# Patient Record
Sex: Female | Born: 1980 | Race: Black or African American | Hispanic: No | Marital: Married | Smoking: Former smoker
Health system: Southern US, Community
[De-identification: ages and names within clinical notes are randomized; demographics above are authoritative.]

## PROBLEM LIST (undated history)

## (undated) DIAGNOSIS — F17200 Nicotine dependence, unspecified, uncomplicated: Secondary | ICD-10-CM

## (undated) DIAGNOSIS — Z9889 Other specified postprocedural states: Secondary | ICD-10-CM

## (undated) DIAGNOSIS — T7840XA Allergy, unspecified, initial encounter: Secondary | ICD-10-CM

## (undated) DIAGNOSIS — R112 Nausea with vomiting, unspecified: Secondary | ICD-10-CM

## (undated) DIAGNOSIS — L732 Hidradenitis suppurativa: Secondary | ICD-10-CM

## (undated) DIAGNOSIS — E041 Nontoxic single thyroid nodule: Secondary | ICD-10-CM

## (undated) DIAGNOSIS — K219 Gastro-esophageal reflux disease without esophagitis: Secondary | ICD-10-CM

## (undated) DIAGNOSIS — G4700 Insomnia, unspecified: Secondary | ICD-10-CM

## (undated) DIAGNOSIS — E049 Nontoxic goiter, unspecified: Secondary | ICD-10-CM

## (undated) DIAGNOSIS — R011 Cardiac murmur, unspecified: Secondary | ICD-10-CM

## (undated) DIAGNOSIS — R51 Headache: Secondary | ICD-10-CM

## (undated) DIAGNOSIS — E669 Obesity, unspecified: Secondary | ICD-10-CM

## (undated) DIAGNOSIS — R7301 Impaired fasting glucose: Secondary | ICD-10-CM

## (undated) HISTORY — PX: TUBAL LIGATION: SHX77

## (undated) HISTORY — DX: Impaired fasting glucose: R73.01

## (undated) HISTORY — DX: Cardiac murmur, unspecified: R01.1

## (undated) HISTORY — DX: Nontoxic single thyroid nodule: E04.1

## (undated) HISTORY — DX: Nontoxic goiter, unspecified: E04.9

## (undated) HISTORY — PX: INCISION AND DRAINAGE: SHX5863

## (undated) HISTORY — DX: Hidradenitis suppurativa: L73.2

## (undated) HISTORY — DX: Nicotine dependence, unspecified, uncomplicated: F17.200

## (undated) HISTORY — DX: Obesity, unspecified: E66.9

## (undated) HISTORY — DX: Insomnia, unspecified: G47.00

## (undated) HISTORY — DX: Allergy, unspecified, initial encounter: T78.40XA

---
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
## 2001-02-09 ENCOUNTER — Inpatient Hospital Stay (HOSPITAL_COMMUNITY): Admission: AD | Admit: 2001-02-09 | Discharge: 2001-02-11 | Payer: Self-pay | Admitting: Obstetrics & Gynecology

## 2001-08-28 ENCOUNTER — Other Ambulatory Visit: Admission: RE | Admit: 2001-08-28 | Discharge: 2001-08-28 | Payer: Self-pay | Admitting: Obstetrics and Gynecology

## 2001-12-02 ENCOUNTER — Emergency Department (HOSPITAL_COMMUNITY): Admission: EM | Admit: 2001-12-02 | Discharge: 2001-12-02 | Payer: Self-pay | Admitting: Emergency Medicine

## 2002-03-20 ENCOUNTER — Emergency Department (HOSPITAL_COMMUNITY): Admission: EM | Admit: 2002-03-20 | Discharge: 2002-03-20 | Payer: Self-pay | Admitting: Emergency Medicine

## 2002-03-20 ENCOUNTER — Encounter: Payer: Self-pay | Admitting: Emergency Medicine

## 2004-09-29 ENCOUNTER — Other Ambulatory Visit: Admission: RE | Admit: 2004-09-29 | Discharge: 2004-09-29 | Payer: Self-pay | Admitting: Family Medicine

## 2004-10-01 ENCOUNTER — Encounter: Admission: RE | Admit: 2004-10-01 | Discharge: 2004-10-01 | Payer: Self-pay | Admitting: Family Medicine

## 2005-08-04 ENCOUNTER — Other Ambulatory Visit: Admission: RE | Admit: 2005-08-04 | Discharge: 2005-08-04 | Payer: Self-pay | Admitting: Family Medicine

## 2006-04-11 HISTORY — PX: PILONIDAL CYST / SINUS EXCISION: SUR543

## 2006-06-21 ENCOUNTER — Emergency Department (HOSPITAL_COMMUNITY): Admission: EM | Admit: 2006-06-21 | Discharge: 2006-06-21 | Payer: Self-pay | Admitting: Emergency Medicine

## 2006-06-25 ENCOUNTER — Emergency Department (HOSPITAL_COMMUNITY): Admission: EM | Admit: 2006-06-25 | Discharge: 2006-06-25 | Payer: Self-pay | Admitting: Emergency Medicine

## 2006-06-27 ENCOUNTER — Ambulatory Visit: Payer: Self-pay | Admitting: Family Medicine

## 2006-06-30 ENCOUNTER — Ambulatory Visit (HOSPITAL_COMMUNITY): Admission: RE | Admit: 2006-06-30 | Discharge: 2006-06-30 | Payer: Self-pay | Admitting: Family Medicine

## 2006-10-03 ENCOUNTER — Ambulatory Visit: Payer: Self-pay | Admitting: Family Medicine

## 2007-04-12 HISTORY — PX: CHOLECYSTECTOMY: SHX55

## 2007-06-07 ENCOUNTER — Ambulatory Visit: Payer: Self-pay | Admitting: Family Medicine

## 2007-09-04 ENCOUNTER — Ambulatory Visit: Payer: Self-pay | Admitting: Family Medicine

## 2007-11-29 ENCOUNTER — Ambulatory Visit: Payer: Self-pay | Admitting: Family Medicine

## 2008-01-01 ENCOUNTER — Ambulatory Visit: Payer: Self-pay | Admitting: Family Medicine

## 2008-03-10 ENCOUNTER — Ambulatory Visit: Payer: Self-pay | Admitting: Family Medicine

## 2009-04-14 ENCOUNTER — Ambulatory Visit: Payer: Self-pay | Admitting: Family Medicine

## 2009-07-24 ENCOUNTER — Ambulatory Visit: Payer: Self-pay | Admitting: Family Medicine

## 2010-05-02 ENCOUNTER — Encounter: Payer: Self-pay | Admitting: Family Medicine

## 2010-06-14 ENCOUNTER — Encounter (INDEPENDENT_AMBULATORY_CARE_PROVIDER_SITE_OTHER): Payer: BC Managed Care – PPO | Admitting: Family Medicine

## 2010-06-14 DIAGNOSIS — L02219 Cutaneous abscess of trunk, unspecified: Secondary | ICD-10-CM

## 2010-06-14 DIAGNOSIS — R5381 Other malaise: Secondary | ICD-10-CM

## 2010-06-14 DIAGNOSIS — E042 Nontoxic multinodular goiter: Secondary | ICD-10-CM

## 2010-06-14 DIAGNOSIS — Z0000 Encounter for general adult medical examination without abnormal findings: Secondary | ICD-10-CM

## 2010-06-16 ENCOUNTER — Ambulatory Visit (INDEPENDENT_AMBULATORY_CARE_PROVIDER_SITE_OTHER): Payer: BC Managed Care – PPO | Admitting: Family Medicine

## 2010-06-16 DIAGNOSIS — J029 Acute pharyngitis, unspecified: Secondary | ICD-10-CM

## 2010-08-27 NOTE — Discharge Summary (Signed)
[facility]  Patient:    Jacqueline Winters, Jacqueline Winters Visit Number: [id number] MRN: [id number]          Service Type: OBS Location: 910A [id number] [id number] Attending Physician:  Mickle Mallory Dictated by:   Gerrit Friends. Aldona Bar, M.D. Admit Date:  02/09/2001 Discharge Date: 02/11/2001                             Discharge Summary  DISCHARGE DIAGNOSES:          1. Term pregnancy, delivered 5 pound                                  12 ounce female infant with Apgars 8 and 9.                               2. Blood type B positive.  PROCEDURES:                   1. Normal spontaneous delivery.                               2. Repair of tear.  SUMMARY:                      This gravida 2, para 59, 30 year old was admitted with ruptured membranes. She had a positive antenatal group B strep culture. Otherwise, her pregnancy was uncomplicated. She had good progression of her labor and subsequently had a normal spontaneous delivery of a viable female infant weighing 5 pounds 12 ounces with Apgars of 8 and 9 over a left labial tear which was repaired subsequently without difficulty. Her postpartum course was benign. Discharge hemoglobin was 10.3, white count 12,500, and platelet count 281,000. On the morning of November 3, she was ambulating well, tolerating a regular diet well, having normal bowel and bladder function, and was afebrile. She was bottle feeding. She was deemed ready for discharge and accordingly, was given a discharge instruction sheet and understood all instructions well.  DISCHARGE MEDICATIONS:        1. Vitamins one a day until gone.                               2. Ferrous sulfate 300 mg daily.                               3. Motrin 600 mg every six hours as needed for                                  pain.                               4. Tylox one to two every four to six hours                                  as needed for severe pain.  DISCHARGE  FOLLOWUP:  The patient will return to the office in four weeks for followup or as needed. At the time of discharge she was improved and, as mentioned, understood all instructions well.  CONDITION ON DISCHARGE:       Improved. Dictated by:   Gerrit Friends. Aldona Bar, M.D. Attending Physician:  Mickle Mallory DD:  02/11/01 TD:  02/12/01 Job: [id number]

## 2011-03-25 ENCOUNTER — Encounter: Payer: Self-pay | Admitting: Medical

## 2011-03-25 ENCOUNTER — Ambulatory Visit (INDEPENDENT_AMBULATORY_CARE_PROVIDER_SITE_OTHER): Payer: BC Managed Care – PPO | Admitting: Medical

## 2011-03-25 VITALS — BP 110/80 | HR 68 | Temp 98.5°F | Resp 16 | Wt 210.0 lb

## 2011-03-25 DIAGNOSIS — R05 Cough: Secondary | ICD-10-CM

## 2011-03-25 DIAGNOSIS — R5383 Other fatigue: Secondary | ICD-10-CM

## 2011-03-25 DIAGNOSIS — R350 Frequency of micturition: Secondary | ICD-10-CM

## 2011-03-25 DIAGNOSIS — M549 Dorsalgia, unspecified: Secondary | ICD-10-CM

## 2011-03-25 DIAGNOSIS — R5381 Other malaise: Secondary | ICD-10-CM

## 2011-03-25 DIAGNOSIS — R059 Cough, unspecified: Secondary | ICD-10-CM

## 2011-03-25 LAB — POCT URINALYSIS DIPSTICK
Glucose, UA: NEGATIVE
Ketones, UA: NEGATIVE
Nitrite, UA: NEGATIVE
Spec Grav, UA: 1.015
Urobilinogen, UA: NEGATIVE

## 2011-03-25 MED ORDER — SULFAMETHOXAZOLE-TRIMETHOPRIM 800-160 MG PO TABS: 1.0000 | ORAL_TABLET | Freq: Two times a day (BID) | ORAL | Status: AC

## 2011-03-25 NOTE — Progress Notes (Signed)
Subjective:   HPI  Jacqueline Winters is a 30 y.o. female who presents with 1 week hx/o fatigue, nasal drainage, mucous, cough, nausea.  Been using Mucinex DM.  Been coughing on and off since thanksgiving.  She also notes pressure in low back, urinary frequency, increased thirst.  Denies burning with urination or belly pain.  She has had sick contacts recently.  Son has been sick.  She is worried about diabetes since her mom and grandmother are both diabetic. No other aggravating or relieving factors.    No other c/o.  The following portions of the patient's history were reviewed and updated as appropriate: allergies, current medications, past family history, past medical history, past social history, past surgical history and problem list.  No past medical history on file.  Review of Systems Constitutional: -fever, -chills, -sweats, -unexpected -weight change,+fatigue ENT: -runny nose, -ear pain, -sore throat Cardiology:  -chest pain, -palpitations, -edema Respiratory: +cough, +shortness of breath, -wheezing,+thirsty Gastroenterology: -abdominal pain, +nausea, -vomiting, -diarrhea, -constipation Hematology: -bleeding or bruising problems Musculoskeletal: -arthralgias, -myalgias, -joint swelling, +back pain Ophthalmology: -vision changes Urology: -dysuria, -difficulty urinating, -hematuria, +urinary frequency, -urgency Neurology: +headache, -weakness, -tingling, -numbness      Objective:   Physical Exam  Filed Vitals:   03/25/11 0956  BP: 110/80  Pulse: 68  Temp: 98.5 F (36.9 C)  Resp: 16    General appearance: alert, no distress, WD/WN HEENT: normocephalic, sclerae anicteric, TMs pearly, nares patent, no discharge or erythema, pharynx with mild erythema Oral cavity: MMM, no lesions Neck: supple, no lymphadenopathy, no thyromegaly, no masses Heart: RRR, normal S1, S2, no murmurs Lungs: CTA bilaterally, no wheezes, rhonchi, or rales Abdomen: +bs, soft, non tender, non  distended, no masses, no hepatomegaly, no splenomegaly Pulses: 2+ symmetric, upper and lower extremities, normal cap refill   Assessment and Plan :    Encounter Diagnoses  Name Primary?  . Fatigue Yes  . Urinary frequency   . Back pain   . Cough    Will treat empirically for UTI and possibly URI.  Script for [employer]. Rest, hydrate well, Ibuprofen prn fever/aches, labs today and will call with results and plan. Follow-up prn.

## 2011-03-26 DIAGNOSIS — R05 Cough: Secondary | ICD-10-CM | POA: Insufficient documentation

## 2011-03-26 DIAGNOSIS — R350 Frequency of micturition: Secondary | ICD-10-CM | POA: Insufficient documentation

## 2011-03-26 DIAGNOSIS — R059 Cough, unspecified: Secondary | ICD-10-CM | POA: Insufficient documentation

## 2011-03-26 DIAGNOSIS — M549 Dorsalgia, unspecified: Secondary | ICD-10-CM | POA: Insufficient documentation

## 2011-03-26 DIAGNOSIS — R5383 Other fatigue: Secondary | ICD-10-CM | POA: Insufficient documentation

## 2011-03-26 LAB — COMPREHENSIVE METABOLIC PANEL
ALT: 12 U/L (ref 0–35)
AST: 15 U/L (ref 0–37)
Alkaline Phosphatase: 47 U/L (ref 39–117)
CO2: 23 mEq/L (ref 19–32)
Chloride: 108 mEq/L (ref 96–112)
Creat: 0.75 mg/dL (ref 0.50–1.10)
Sodium: 139 mEq/L (ref 135–145)
Total Bilirubin: 0.3 mg/dL (ref 0.3–1.2)
Total Protein: 6.7 g/dL (ref 6.0–8.3)

## 2011-03-26 LAB — CBC WITH DIFFERENTIAL/PLATELET
Basophils Relative: 0 % (ref 0–1)
MCH: 29.3 pg (ref 26.0–34.0)
MCHC: 32.2 g/dL (ref 30.0–36.0)
WBC: 9.7 10*3/uL (ref 4.0–10.5)

## 2011-03-28 LAB — URINE CULTURE

## 2011-06-14 LAB — OB RESULTS CONSOLE ABO/RH

## 2011-06-14 LAB — OB RESULTS CONSOLE HIV ANTIBODY (ROUTINE TESTING): HIV: NONREACTIVE

## 2011-06-14 LAB — OB RESULTS CONSOLE HEPATITIS B SURFACE ANTIGEN: Hepatitis B Surface Ag: NEGATIVE

## 2011-07-20 ENCOUNTER — Ambulatory Visit (INDEPENDENT_AMBULATORY_CARE_PROVIDER_SITE_OTHER): Payer: BC Managed Care – PPO | Admitting: Medical

## 2011-07-20 ENCOUNTER — Encounter: Payer: Self-pay | Admitting: Medical

## 2011-07-20 VITALS — BP 110/80 | HR 62 | Temp 98.5°F | Resp 16 | Wt 220.0 lb

## 2011-07-20 DIAGNOSIS — Z331 Pregnant state, incidental: Secondary | ICD-10-CM

## 2011-07-20 DIAGNOSIS — J069 Acute upper respiratory infection, unspecified: Secondary | ICD-10-CM

## 2011-07-20 DIAGNOSIS — J029 Acute pharyngitis, unspecified: Secondary | ICD-10-CM

## 2011-07-20 LAB — POCT RAPID STREP A (OFFICE): Rapid Strep A Screen: NEGATIVE

## 2011-07-20 NOTE — Progress Notes (Signed)
Subjective:  Jacqueline Winters is a 31 y.o. female who presents for nasal congestion, drainage, sore throat, neck tender, lymph nodes tender, sinus pressure worse x 3days, but has had ongoing head congestion for 2 months.  Drainage is still clear, is fatigue feeling too.  Felt warm yesterday but no fever.  Denies cough, NV, but +some loose stools this past weekend.  She is currently pregnant, followed by OB/Gyn currently - [redacted] weeks pregnant currently.  Was on allegra prior to pregnancy, but not on any allergy medication currently.  She notes hx/o sinus infection.  Patient is a smoker - off an on, uses occasional cigarette. Daughter has had stomach virus but no URI symptoms.  Using nothing for symptoms.   No other aggravating or relieving factors.  No other c/o.  ROS as noted in HPI  Objective:   Filed Vitals:   07/20/11 0846  BP: 110/80  Pulse: 62  Temp: 98.5 F (36.9 C)  Resp: 16    General appearance: Alert, WD/WN, no distress                             Skin: warm, no rash                           Head: no sinus tenderness                            Eyes: conjunctiva normal, corneas clear, PERRLA                            Ears: pearly TMs, external ear canals normal                          Nose: septum midline, turbinates swollen, with erythema and clear discharge             Mouth/throat: MMM, tongue normal, mild pharyngeal erythema                           Neck: supple, no adenopathy, no thyromegaly, nontender                          Heart: RRR, normal S1, S2, no murmurs                         Lungs: CTA bilaterally, no wheezes, rales, or rhonchi      Assessment and Plan:   Encounter Diagnoses  Name Primary?  . URI (upper respiratory infection) Yes  . Pharyngitis    Strep negative.  Advised supportive care, nasal saline, rest, hydrate well, begin Zyrtec for next 1-4 weeks for allergies, and if worse by end of week such as colored nasal drainage, fever, teeth pain, then  call or return.

## 2011-07-20 NOTE — Patient Instructions (Signed)
Begin Zyrtec 10mg  at bedtime daily for allergies, nasal saline, Tylenol for pain, and if worse with fever and colored mucous by end of the week,call back.

## 2011-11-30 LAB — OB RESULTS CONSOLE GBS: GBS: NEGATIVE

## 2011-12-20 ENCOUNTER — Other Ambulatory Visit: Payer: Self-pay | Admitting: Obstetrics and Gynecology

## 2011-12-20 ENCOUNTER — Encounter (HOSPITAL_COMMUNITY): Payer: Self-pay

## 2011-12-21 ENCOUNTER — Encounter (HOSPITAL_COMMUNITY): Payer: Self-pay | Admitting: Anesthesiology

## 2011-12-21 ENCOUNTER — Inpatient Hospital Stay (HOSPITAL_COMMUNITY)
Admission: AD | Admit: 2011-12-21 | Discharge: 2011-12-24 | DRG: 766 | Disposition: A | Discharge status: Home / Self Care | Payer: Commercial Managed Care - PPO | Source: Ambulatory Visit | Attending: Obstetrics and Gynecology | Admitting: Obstetrics and Gynecology

## 2011-12-21 ENCOUNTER — Encounter (HOSPITAL_COMMUNITY): Payer: Self-pay | Admitting: *Deleted

## 2011-12-21 ENCOUNTER — Encounter (HOSPITAL_COMMUNITY): Payer: Self-pay

## 2011-12-21 ENCOUNTER — Encounter (HOSPITAL_COMMUNITY)
Admission: AD | Disposition: A | Discharge status: Home / Self Care | Payer: Self-pay | Source: Ambulatory Visit | Attending: Obstetrics and Gynecology

## 2011-12-21 ENCOUNTER — Encounter (HOSPITAL_COMMUNITY)
Admission: RE | Admit: 2011-12-21 | Discharge: 2011-12-21 | Disposition: A | Discharge status: Home / Self Care | Payer: Commercial Managed Care - PPO | Source: Ambulatory Visit | Attending: Obstetrics and Gynecology | Admitting: Obstetrics and Gynecology

## 2011-12-21 ENCOUNTER — Inpatient Hospital Stay (HOSPITAL_COMMUNITY): Payer: Commercial Managed Care - PPO | Admitting: Anesthesiology

## 2011-12-21 DIAGNOSIS — M549 Dorsalgia, unspecified: Secondary | ICD-10-CM

## 2011-12-21 DIAGNOSIS — O328XX0 Maternal care for other malpresentation of fetus, not applicable or unspecified: Principal | ICD-10-CM | POA: Diagnosis present

## 2011-12-21 DIAGNOSIS — R350 Frequency of micturition: Secondary | ICD-10-CM

## 2011-12-21 DIAGNOSIS — R05 Cough: Secondary | ICD-10-CM

## 2011-12-21 DIAGNOSIS — R5383 Other fatigue: Secondary | ICD-10-CM

## 2011-12-21 DIAGNOSIS — Z01812 Encounter for preprocedural laboratory examination: Secondary | ICD-10-CM

## 2011-12-21 DIAGNOSIS — Z3480 Encounter for supervision of other normal pregnancy, unspecified trimester: Secondary | ICD-10-CM

## 2011-12-21 DIAGNOSIS — R059 Cough, unspecified: Secondary | ICD-10-CM

## 2011-12-21 DIAGNOSIS — Z302 Encounter for sterilization: Secondary | ICD-10-CM

## 2011-12-21 HISTORY — DX: Headache: R51

## 2011-12-21 HISTORY — DX: Gastro-esophageal reflux disease without esophagitis: K21.9

## 2011-12-21 HISTORY — DX: Other specified postprocedural states: Z98.890

## 2011-12-21 HISTORY — DX: Nausea with vomiting, unspecified: R11.2

## 2011-12-21 LAB — CBC
HCT: 35 % — ABNORMAL LOW (ref 36.0–46.0)
HCT: 34.7 % — ABNORMAL LOW (ref 36.0–46.0)
Hemoglobin: 11.7 g/dL — ABNORMAL LOW (ref 12.0–15.0)
Hemoglobin: 11.6 g/dL — ABNORMAL LOW (ref 12.0–15.0)
MCH: 29 pg (ref 26.0–34.0)
MCHC: 33.4 g/dL (ref 30.0–36.0)
MCV: 86.5 fL (ref 78.0–100.0)
Platelets: 253 10*3/uL (ref 150–400)
RBC: 4.03 MIL/uL (ref 3.87–5.11)
RBC: 4.01 MIL/uL (ref 3.87–5.11)
RDW: 13.3 % (ref 11.5–15.5)
RDW: 13.3 % (ref 11.5–15.5)
WBC: 11.2 10*3/uL — ABNORMAL HIGH (ref 4.0–10.5)
WBC: 14.5 10*3/uL — ABNORMAL HIGH (ref 4.0–10.5)

## 2011-12-21 LAB — TYPE AND SCREEN
ABO/RH(D): B POS
Antibody Screen: NEGATIVE

## 2011-12-21 LAB — SURGICAL PCR SCREEN
MRSA, PCR: NEGATIVE
Staphylococcus aureus: NEGATIVE

## 2011-12-21 SURGERY — Surgical Case
Anesthesia: Spinal | Site: Abdomen | Wound class: Clean Contaminated

## 2011-12-21 MED ORDER — MEPERIDINE HCL 25 MG/ML IJ SOLN
INTRAMUSCULAR | Status: AC
  Filled 2011-12-21: qty 1

## 2011-12-21 MED ORDER — MORPHINE SULFATE 0.5 MG/ML IJ SOLN
INTRAMUSCULAR | Status: AC
  Filled 2011-12-21: qty 10

## 2011-12-21 MED ORDER — PHENYLEPHRINE HCL 10 MG/ML IJ SOLN
INTRAMUSCULAR | Status: DC | PRN
  Administered 2011-12-21: 80 ug via INTRAVENOUS
  Administered 2011-12-21: 60 ug via INTRAVENOUS
  Administered 2011-12-21 (×4): 80 ug via INTRAVENOUS

## 2011-12-21 MED ORDER — PHENYLEPHRINE 40 MCG/ML (10ML) SYRINGE FOR IV PUSH (FOR BLOOD PRESSURE SUPPORT)
PREFILLED_SYRINGE | INTRAVENOUS | Status: AC
  Filled 2011-12-21: qty 5

## 2011-12-21 MED ORDER — SCOPOLAMINE 1 MG/3DAYS TD PT72
MEDICATED_PATCH | TRANSDERMAL | Status: AC
  Filled 2011-12-21: qty 1

## 2011-12-21 MED ORDER — TERBUTALINE SULFATE 1 MG/ML IJ SOLN
0.2500 mg | Freq: Once | INTRAMUSCULAR | Status: DC
  Filled 2011-12-21: qty 1

## 2011-12-21 MED ORDER — CITRIC ACID-SODIUM CITRATE 334-500 MG/5ML PO SOLN
30.0000 mL | Freq: Once | ORAL | Status: AC
  Administered 2011-12-21: 30 mL via ORAL
  Filled 2011-12-21: qty 15

## 2011-12-21 MED ORDER — SCOPOLAMINE 1 MG/3DAYS TD PT72
1.0000 | MEDICATED_PATCH | Freq: Once | TRANSDERMAL | Status: DC
  Administered 2011-12-22: 1.5 mg via TRANSDERMAL

## 2011-12-21 MED ORDER — CEFAZOLIN SODIUM-DEXTROSE 2-3 GM-% IV SOLR
INTRAVENOUS | Status: DC | PRN
  Administered 2011-12-21: 2 g via INTRAVENOUS

## 2011-12-21 MED ORDER — BUPIVACAINE IN DEXTROSE 0.75-8.25 % IT SOLN
INTRATHECAL | Status: DC | PRN
  Administered 2011-12-21: 1.5 mL via INTRATHECAL

## 2011-12-21 MED ORDER — FENTANYL CITRATE 0.05 MG/ML IJ SOLN: 25.0000 ug | INTRAMUSCULAR | Status: DC | PRN

## 2011-12-21 MED ORDER — MEPERIDINE HCL 25 MG/ML IJ SOLN
6.2500 mg | INTRAMUSCULAR | Status: AC | PRN
  Administered 2011-12-21 (×2): 6.25 mg via INTRAVENOUS

## 2011-12-21 MED ORDER — MEPERIDINE HCL 25 MG/ML IJ SOLN
INTRAMUSCULAR | Status: DC | PRN
  Administered 2011-12-21 (×2): 6 mg via INTRAVENOUS
  Administered 2011-12-21: 7 mg via INTRAVENOUS
  Administered 2011-12-21: 6 mg via INTRAVENOUS

## 2011-12-21 MED ORDER — OXYTOCIN 10 UNIT/ML IJ SOLN
40.0000 [IU] | INTRAVENOUS | Status: DC | PRN
  Administered 2011-12-21: 40 [IU] via INTRAVENOUS

## 2011-12-21 MED ORDER — CEFAZOLIN SODIUM-DEXTROSE 2-3 GM-% IV SOLR
INTRAVENOUS | Status: AC
  Filled 2011-12-21: qty 50

## 2011-12-21 MED ORDER — MORPHINE SULFATE (PF) 0.5 MG/ML IJ SOLN
INTRAMUSCULAR | Status: DC | PRN
  Administered 2011-12-21: .1 mg via INTRATHECAL

## 2011-12-21 MED ORDER — ONDANSETRON HCL 4 MG/2ML IJ SOLN
INTRAMUSCULAR | Status: AC
  Filled 2011-12-21: qty 2

## 2011-12-21 MED ORDER — OXYTOCIN 10 UNIT/ML IJ SOLN
INTRAMUSCULAR | Status: AC
  Filled 2011-12-21: qty 4

## 2011-12-21 MED ORDER — ONDANSETRON HCL 4 MG/2ML IJ SOLN
INTRAMUSCULAR | Status: DC | PRN
  Administered 2011-12-21: 4 mg via INTRAVENOUS

## 2011-12-21 MED ORDER — KETOROLAC TROMETHAMINE 60 MG/2ML IM SOLN
INTRAMUSCULAR | Status: AC
  Filled 2011-12-21: qty 2

## 2011-12-21 MED ORDER — KETOROLAC TROMETHAMINE 60 MG/2ML IM SOLN
60.0000 mg | Freq: Once | INTRAMUSCULAR | Status: AC | PRN
  Administered 2011-12-22: 60 mg via INTRAMUSCULAR

## 2011-12-21 MED ORDER — TERBUTALINE SULFATE 1 MG/ML IJ SOLN
0.2500 mg | Freq: Once | INTRAMUSCULAR | Status: AC
  Administered 2011-12-21: 0.25 mg via SUBCUTANEOUS

## 2011-12-21 MED ORDER — FENTANYL CITRATE 0.05 MG/ML IJ SOLN
INTRAMUSCULAR | Status: AC
  Filled 2011-12-21: qty 2

## 2011-12-21 MED ORDER — FAMOTIDINE IN NACL 20-0.9 MG/50ML-% IV SOLN
20.0000 mg | Freq: Once | INTRAVENOUS | Status: DC
  Filled 2011-12-21: qty 50

## 2011-12-21 MED ORDER — FENTANYL CITRATE 0.05 MG/ML IJ SOLN
INTRAMUSCULAR | Status: DC | PRN
  Administered 2011-12-21: 15 ug via INTRATHECAL

## 2011-12-21 MED ORDER — LACTATED RINGERS IV SOLN
INTRAVENOUS | Status: DC | PRN
  Administered 2011-12-21: 23:00:00 via INTRAVENOUS

## 2011-12-21 MED ORDER — TERBUTALINE SULFATE 1 MG/ML IJ SOLN
INTRAMUSCULAR | Status: AC
  Filled 2011-12-21: qty 1

## 2011-12-21 SURGICAL SUPPLY — 32 items
ADH SKN CLS APL DERMABOND .7 (GAUZE/BANDAGES/DRESSINGS) ×1
CLIP FILSHIE TUBAL LIGA STRL (Clip) ×1 IMPLANT
CLOTH BEACON ORANGE TIMEOUT ST (SAFETY) ×2 IMPLANT
DERMABOND ADVANCED (GAUZE/BANDAGES/DRESSINGS) ×1
DERMABOND ADVANCED .7 DNX12 (GAUZE/BANDAGES/DRESSINGS) IMPLANT
DRESSING TELFA 8X3 (GAUZE/BANDAGES/DRESSINGS) ×2 IMPLANT
DRSG COVADERM 4X10 (GAUZE/BANDAGES/DRESSINGS) IMPLANT
ELECT REM PT RETURN 9FT ADLT (ELECTROSURGICAL) ×2
ELECTRODE REM PT RTRN 9FT ADLT (ELECTROSURGICAL) ×1 IMPLANT
EXTRACTOR VACUUM M CUP 4 TUBE (SUCTIONS) IMPLANT
GAUZE SPONGE 4X4 12PLY STRL LF (GAUZE/BANDAGES/DRESSINGS) ×4 IMPLANT
GLOVE BIO SURGEON STRL SZ7 (GLOVE) ×4 IMPLANT
GOWN PREVENTION PLUS LG XLONG (DISPOSABLE) ×4 IMPLANT
KIT ABG SYR 3ML LUER SLIP (SYRINGE) IMPLANT
NDL HYPO 25X5/8 SAFETYGLIDE (NEEDLE) IMPLANT
NEEDLE HYPO 25X5/8 SAFETYGLIDE (NEEDLE) IMPLANT
NS IRRIG 1000ML POUR BTL (IV SOLUTION) ×2 IMPLANT
PACK C SECTION WH (CUSTOM PROCEDURE TRAY) ×2 IMPLANT
PAD ABD 7.5X8 STRL (GAUZE/BANDAGES/DRESSINGS) ×2 IMPLANT
PAD OB MATERNITY 4.3X12.25 (PERSONAL CARE ITEMS) ×1 IMPLANT
RTRCTR C-SECT PINK 25CM LRG (MISCELLANEOUS) IMPLANT
RTRCTR C-SECT PINK 34CM XLRG (MISCELLANEOUS) IMPLANT
SLEEVE SCD COMPRESS KNEE MED (MISCELLANEOUS) ×1 IMPLANT
STAPLER VISISTAT 35W (STAPLE) IMPLANT
SUT CHROMIC 1 CTX 36 (SUTURE) ×4 IMPLANT
SUT PDS AB 0 CTX 60 (SUTURE) ×2 IMPLANT
SUT VIC AB 2-0 CT1 27 (SUTURE) ×2
SUT VIC AB 2-0 CT1 TAPERPNT 27 (SUTURE) ×1 IMPLANT
SUT VIC AB 4-0 KS 27 (SUTURE) IMPLANT
TOWEL OR 17X24 6PK STRL BLUE (TOWEL DISPOSABLE) ×4 IMPLANT
TRAY FOLEY CATH 14FR (SET/KITS/TRAYS/PACK) ×2 IMPLANT
WATER STERILE IRR 1000ML POUR (IV SOLUTION) ×2 IMPLANT

## 2011-12-21 NOTE — Transfer of Care (Signed)
Immediate Anesthesia Transfer of Care Note  Patient: Jacqueline Winters  Procedure(s) Performed: Procedure(s) (LRB) with comments: CESAREAN SECTION (N/A) - Bilateral Tubal Ligation  Patient Location: PACU  Anesthesia Type: Spinal  Level of Consciousness: awake, alert , oriented and patient cooperative  Airway & Oxygen Therapy: Patient Spontanous Breathing  Post-op Assessment: Report given to PACU RN and Post -op Vital signs reviewed and stable  Post vital signs: Reviewed and stable  Complications: No apparent anesthesia complications

## 2011-12-21 NOTE — Anesthesia Procedure Notes (Signed)
Spinal  Patient location during procedure: OR Start time: 12/21/2011 10:40 PM Reason for block: procedure for pain Staffing Performed by: anesthesiologist  Preanesthetic Checklist Completed: patient identified, site marked, surgical consent, pre-op evaluation, timeout performed, IV checked, risks and benefits discussed and monitors and equipment checked Spinal Block Patient position: sitting Prep: site prepped and draped and DuraPrep Patient monitoring: heart rate, continuous pulse ox and blood pressure Approach: midline Location: L3-4 Injection technique: single-shot Needle Needle type: Sprotte  Needle gauge: 24 G Needle length: 9 cm Assessment Sensory level: T4 Additional Notes Clear free flow CSF on first attempt.  No paresthesia.  Patient tolerated procedure well.  Jasmine December, MD

## 2011-12-21 NOTE — Op Note (Signed)
Pre-Operative Diagnosis: 1) 38+6 week intrauterine pregnancy 2) Double footling breech presentation 3) Spontaneous rupture of membranes 4) Active labor 5) Desired permanent sterilization Postoperative Diagnosis: Same Procedure: Primary low transverse cesarean section via pfannenstiel skin incision and bilateral tubal ligation with filshie clips Surgeon: Dr. Waynard Reeds Assistant: Dr. Christin Bach Operative Findings: Vigorous female infant in the double footling breech presentation with apgars of 9 & 9. Weight pending. Normal ovaries, tubes, and uterus. Specimen: Placenta for disposal EBL: Total I/O In: 1400 [I.V.:1400] Out: 650 [Urine:50; Blood:600]   Procedure:Jacqueline Winters is an 31 year old gravida 3 para 2002 at 42 weeks and 6 days estimated gestational age who presents for cesarean section. The patient was known to have fetal malpresentation. She was off for external cephalic version on multiple occasions but declined and preferred to proceed with cesarean section. On the evening of presentation the patient complained of painful contractions and in route to the hospital she spontaneously ruptured her membranes. Upon presentation to maternity admission she was 6 cm dilated. She was initially transferred to labor and delivery. In L&D was felt that she was 8-9 cm dilated with the breech at 1+ presentation. With the urge to push with contractions the patient pushed for 2-3 tries. However with further evaluation and without a contraction her cervix was felt to be 6-7 cm dilated. The patient had excellent control and the decision was made to proceed to the OR. The patient is a scheduled section for tomorrow and had previously signed a consent for cesarean section and tubal ligation. Tubal ligation was not originally placed on it the consent for surgery tonight, however the patient requested tubal ligation and myself and Dr. Emelda Fear witnessed this request. Therefore the patient was verbally consented.  Following the appropriate informed consent the patient was brought to the operating room where spinal anesthesia was administered and found to be adequate. She was placed in the dorsal supine position with a leftward tilt. She was prepped and draped in the normal sterile fashion. Scalpel was then used to make a Pfannenstiel skin incision which was carried down to the underlying layers of soft tissue to the fascia. The fascia was incised in the midline and the fascial incision was extended laterally with Mayo scissors. The superior aspect of the fascial incision was grasped with Coker clamps x2, tented up and the rectus muscles dissected off sharply with the mayo scissors and the same procedure was repeated on the inferior aspect of the fascial incision. The rectus muscles were separated in the midline. The abdominal peritoneum was identified, tented up, entered sharply, and the incision was extended superiorly and inferiorly with good visualization of the bladder. The bladder blade was inserted. The scalpel was then used to make a low transverse incision on the uterus which was extended laterally with both blunt dissection. The fetal feet were identified, delivered easily through the uterine incision followed by the body and head. The infant was bulb suctioned on the operative field cried vigorously, cord was clamped and cut and the infant was passed to the waiting neonatologist. Placenta was then delivered spontaneously, the uterus was cleared of all clot and debris. The uterine incision was repaired with #1 chromic in running locked fashion followed by a second imbricating layer. Ovaries and tubes were inspected and normal. Attention was turned to the tubal ligation portion of the procedure.  The left fallopian tube was grasped, a clear space was identified in the mesosalpinx and the filshie clip was applied to the tube.  The same  procedure was repeated on the right tube . The uterus was returned to the abdominal  cavity the abdominal cavity was cleared of all clot and debris. The abdominal peritoneum was reapproximated with 2-0 Vicryl in a running fashion. The fascia was closed with a looped PDS in a running fashion. The skin was closed with 4-0 vicryl in a subcuticular fashion and Dermabond. All sponge lap and needle counts were correct x2. Patient tolerated the procedure well and recovered in stable condition following the procedure.

## 2011-12-21 NOTE — Anesthesia Postprocedure Evaluation (Deleted)
  Anesthesia Post-op Note  Patient: Jacqueline Winters  Procedure(s) Performed: Procedure(s) (LRB) with comments: CESAREAN SECTION (N/A) - Bilateral Tubal Ligation  Patient Location: PACU  Anesthesia Type: Spinal  Level of Consciousness: awake, alert , oriented and patient cooperative  Airway and Oxygen Therapy: Patient Spontanous Breathing  Post-op Pain: none  Post-op Assessment: Post-op Vital signs reviewed  Post-op Vital Signs: Reviewed and stable  Complications: No apparent anesthesia complications

## 2011-12-21 NOTE — Anesthesia Preprocedure Evaluation (Signed)
Anesthesia Evaluation  Patient identified by MRN, date of birth, ID band Patient awake    Reviewed: Allergy & Precautions, H&P , NPO status , Patient's Chart, lab work & pertinent test results, reviewed documented beta blocker date and time   History of Anesthesia Complications Negative for: history of anesthetic complications (denies)  Airway Mallampati: II TM Distance: >3 FB Neck ROM: full    Dental  (+) Teeth Intact   Pulmonary shortness of breath (with pregnancy), Current Smoker,  breath sounds clear to auscultation        Cardiovascular negative cardio ROS  Rhythm:regular Rate:Normal + Systolic murmurs (II/VI)    Neuro/Psych negative neurological ROS  negative psych ROS   GI/Hepatic Neg liver ROS, GERD- (with pregnancy)  ,  Endo/Other  Morbid obesity  Renal/GU negative Renal ROS     Musculoskeletal   Abdominal   Peds  Hematology negative hematology ROS (+)   Anesthesia Other Findings Ate at 9 pm  Reproductive/Obstetrics (+) Pregnancy (breech, ruptured in labor)                           Anesthesia Physical Anesthesia Plan  ASA: III and Emergent  Anesthesia Plan: Spinal   Post-op Pain Management:    Induction:   Airway Management Planned:   Additional Equipment:   Intra-op Plan:   Post-operative Plan:   Informed Consent: I have reviewed the patients History and Physical, chart, labs and discussed the procedure including the risks, benefits and alternatives for the proposed anesthesia with the patient or authorized representative who has indicated his/her understanding and acceptance.     Plan Discussed with: Surgeon and CRNA  Anesthesia Plan Comments:         Anesthesia Quick Evaluation

## 2011-12-21 NOTE — Patient Instructions (Addendum)
   Your procedure is scheduled on: Thursday September 12th  Enter through the Main Entrance of [facility] at: 10am Pick up the phone at the desk and dial [phone] and inform us of your arrival.  Please call this number if you have any problems the morning of surgery: [phone]  Remember: Do not eat food after midnight Wednesday You may have water until 7:30 am Thursday   Do not wear jewelry, make-up, or FINGER nail polish No metal in your hair or on your body. Do not wear lotions, powders, perfumes or deodorant. Do not shave 48 hours prior to surgery. Do not bring valuables to the hospital.  Leave suitcase in the car. After Surgery it may be brought to your room. For patients being admitted to the hospital, checkout time is 11:00am the day of discharge.   Remember to use your hibiclens as instructed.Please shower with 1/2 bottle the evening before your surgery and the other 1/2 bottle the morning of surgery. Neck down avoiding private area.

## 2011-12-22 ENCOUNTER — Inpatient Hospital Stay (HOSPITAL_COMMUNITY)
Admission: RE | Admit: 2011-12-22 | Payer: Commercial Managed Care - PPO | Source: Ambulatory Visit | Admitting: Obstetrics and Gynecology

## 2011-12-22 ENCOUNTER — Encounter (HOSPITAL_COMMUNITY)
Admission: AD | Disposition: A | Discharge status: Home / Self Care | Payer: Self-pay | Source: Ambulatory Visit | Attending: Obstetrics and Gynecology

## 2011-12-22 ENCOUNTER — Encounter (HOSPITAL_COMMUNITY): Payer: Self-pay | Admitting: *Deleted

## 2011-12-22 LAB — CBC
MCV: 86.7 fL (ref 78.0–100.0)
Platelets: 202 10*3/uL (ref 150–400)
RBC: 3.31 MIL/uL — ABNORMAL LOW (ref 3.87–5.11)
WBC: 18.2 10*3/uL — ABNORMAL HIGH (ref 4.0–10.5)

## 2011-12-22 SURGERY — Surgical Case
Anesthesia: Regional | Laterality: Bilateral

## 2011-12-22 MED ORDER — DIPHENHYDRAMINE HCL 50 MG/ML IJ SOLN: 12.5000 mg | INTRAMUSCULAR | Status: DC | PRN

## 2011-12-22 MED ORDER — NALBUPHINE HCL 10 MG/ML IJ SOLN
5.0000 mg | INTRAMUSCULAR | Status: DC | PRN
  Filled 2011-12-22: qty 1

## 2011-12-22 MED ORDER — OXYTOCIN 40 UNITS IN LACTATED RINGERS INFUSION - SIMPLE MED: 62.5000 mL/h | INTRAVENOUS | Status: AC

## 2011-12-22 MED ORDER — MENTHOL 3 MG MT LOZG: 1.0000 | LOZENGE | OROMUCOSAL | Status: DC | PRN

## 2011-12-22 MED ORDER — SODIUM CHLORIDE 0.9 % IJ SOLN: 3.0000 mL | INTRAMUSCULAR | Status: DC | PRN

## 2011-12-22 MED ORDER — CEFAZOLIN SODIUM-DEXTROSE 2-3 GM-% IV SOLR: 2.0000 g | INTRAVENOUS | Status: DC

## 2011-12-22 MED ORDER — INFLUENZA VIRUS VACC SPLIT PF IM SUSP
0.5000 mL | INTRAMUSCULAR | Status: AC
  Administered 2011-12-23: 0.5 mL via INTRAMUSCULAR
  Filled 2011-12-22: qty 0.5

## 2011-12-22 MED ORDER — SIMETHICONE 80 MG PO CHEW
80.0000 mg | CHEWABLE_TABLET | Freq: Three times a day (TID) | ORAL | Status: DC
  Administered 2011-12-22 – 2011-12-24 (×8): 80 mg via ORAL

## 2011-12-22 MED ORDER — WITCH HAZEL-GLYCERIN EX PADS: 1.0000 "application " | MEDICATED_PAD | CUTANEOUS | Status: DC | PRN

## 2011-12-22 MED ORDER — PRENATAL MULTIVITAMIN CH
1.0000 | ORAL_TABLET | Freq: Every day | ORAL | Status: DC
  Administered 2011-12-23 – 2011-12-24 (×2): 1 via ORAL
  Filled 2011-12-22 (×2): qty 1

## 2011-12-22 MED ORDER — LACTATED RINGERS IV SOLN: INTRAVENOUS | Status: DC

## 2011-12-22 MED ORDER — KETOROLAC TROMETHAMINE 30 MG/ML IJ SOLN: 30.0000 mg | Freq: Four times a day (QID) | INTRAMUSCULAR | Status: AC | PRN

## 2011-12-22 MED ORDER — DIPHENHYDRAMINE HCL 25 MG PO CAPS: 25.0000 mg | ORAL_CAPSULE | ORAL | Status: DC | PRN

## 2011-12-22 MED ORDER — TETANUS-DIPHTH-ACELL PERTUSSIS 5-2.5-18.5 LF-MCG/0.5 IM SUSP: 0.5000 mL | Freq: Once | INTRAMUSCULAR | Status: DC

## 2011-12-22 MED ORDER — METOCLOPRAMIDE HCL 5 MG/ML IJ SOLN: 10.0000 mg | Freq: Three times a day (TID) | INTRAMUSCULAR | Status: DC | PRN

## 2011-12-22 MED ORDER — ONDANSETRON HCL 4 MG/2ML IJ SOLN: 4.0000 mg | INTRAMUSCULAR | Status: DC | PRN

## 2011-12-22 MED ORDER — IBUPROFEN 600 MG PO TABS
600.0000 mg | ORAL_TABLET | Freq: Four times a day (QID) | ORAL | Status: DC
  Administered 2011-12-22 – 2011-12-24 (×8): 600 mg via ORAL
  Filled 2011-12-22 (×3): qty 1

## 2011-12-22 MED ORDER — DIPHENHYDRAMINE HCL 25 MG PO CAPS: 25.0000 mg | ORAL_CAPSULE | Freq: Four times a day (QID) | ORAL | Status: DC | PRN

## 2011-12-22 MED ORDER — SODIUM CHLORIDE 0.9 % IV SOLN
1.0000 ug/kg/h | INTRAVENOUS | Status: DC | PRN
  Filled 2011-12-22: qty 2.5

## 2011-12-22 MED ORDER — LACTATED RINGERS IV SOLN
INTRAVENOUS | Status: DC
  Administered 2011-12-22: 08:00:00 via INTRAVENOUS

## 2011-12-22 MED ORDER — PNEUMOCOCCAL VAC POLYVALENT 25 MCG/0.5ML IJ INJ
0.5000 mL | INJECTION | INTRAMUSCULAR | Status: AC
  Administered 2011-12-23: 0.5 mL via INTRAMUSCULAR
  Filled 2011-12-22: qty 0.5

## 2011-12-22 MED ORDER — KETOROLAC TROMETHAMINE 30 MG/ML IJ SOLN
30.0000 mg | Freq: Four times a day (QID) | INTRAMUSCULAR | Status: AC | PRN
  Filled 2011-12-22: qty 1

## 2011-12-22 MED ORDER — OXYCODONE-ACETAMINOPHEN 5-325 MG PO TABS
1.0000 | ORAL_TABLET | ORAL | Status: DC | PRN
  Administered 2011-12-22: 2 via ORAL
  Administered 2011-12-22: 1 via ORAL
  Administered 2011-12-23: 2 via ORAL
  Administered 2011-12-23 – 2011-12-24 (×3): 1 via ORAL
  Filled 2011-12-22: qty 2
  Filled 2011-12-22 (×3): qty 1
  Filled 2011-12-22: qty 2
  Filled 2011-12-22: qty 1

## 2011-12-22 MED ORDER — SENNOSIDES-DOCUSATE SODIUM 8.6-50 MG PO TABS
2.0000 | ORAL_TABLET | Freq: Every day | ORAL | Status: DC
  Administered 2011-12-22 – 2011-12-23 (×2): 2 via ORAL

## 2011-12-22 MED ORDER — LANOLIN HYDROUS EX OINT: 1.0000 "application " | TOPICAL_OINTMENT | CUTANEOUS | Status: DC | PRN

## 2011-12-22 MED ORDER — IBUPROFEN 600 MG PO TABS
600.0000 mg | ORAL_TABLET | Freq: Four times a day (QID) | ORAL | Status: DC | PRN
  Filled 2011-12-22 (×5): qty 1

## 2011-12-22 MED ORDER — ONDANSETRON HCL 4 MG/2ML IJ SOLN: 4.0000 mg | Freq: Three times a day (TID) | INTRAMUSCULAR | Status: DC | PRN

## 2011-12-22 MED ORDER — ONDANSETRON HCL 4 MG PO TABS: 4.0000 mg | ORAL_TABLET | ORAL | Status: DC | PRN

## 2011-12-22 MED ORDER — ZOLPIDEM TARTRATE 5 MG PO TABS: 5.0000 mg | ORAL_TABLET | Freq: Every evening | ORAL | Status: DC | PRN

## 2011-12-22 MED ORDER — SIMETHICONE 80 MG PO CHEW: 80.0000 mg | CHEWABLE_TABLET | ORAL | Status: DC | PRN

## 2011-12-22 MED ORDER — DIPHENHYDRAMINE HCL 50 MG/ML IJ SOLN: 25.0000 mg | INTRAMUSCULAR | Status: DC | PRN

## 2011-12-22 MED ORDER — NALOXONE HCL 0.4 MG/ML IJ SOLN: 0.4000 mg | INTRAMUSCULAR | Status: DC | PRN

## 2011-12-22 MED ORDER — DIBUCAINE 1 % RE OINT: 1.0000 "application " | TOPICAL_OINTMENT | RECTAL | Status: DC | PRN

## 2011-12-22 NOTE — Anesthesia Postprocedure Evaluation (Signed)
  Anesthesia Post-op Note  Patient: Jacqueline Winters  Procedure(s) Performed: Procedure(s) (LRB) with comments: CESAREAN SECTION (N/A) - Bilateral Tubal Ligation  Patient Location: Mother/Baby  Anesthesia Type: Spinal  Level of Consciousness: awake  Airway and Oxygen Therapy: Patient Spontanous Breathing  Post-op Pain: none  Post-op Assessment: Patient's Cardiovascular Status Stable, Respiratory Function Stable, Patent Airway, No signs of Nausea or vomiting, Adequate PO intake, Pain level controlled, No headache, No backache, No residual numbness and No residual motor weakness  Post-op Vital Signs: Reviewed and stable  Complications: No apparent anesthesia complications

## 2011-12-22 NOTE — Anesthesia Postprocedure Evaluation (Signed)
Anesthesia Post Note  Patient: Jacqueline Winters  Procedure(s) Performed: Procedure(s) (LRB): CESAREAN SECTION (N/A)  Anesthesia type: Spinal  Patient location: PACU  Post pain: Pain level controlled  Post assessment: Post-op Vital signs reviewed  Post vital signs: Reviewed  Level of consciousness: awake  Complications: No apparent anesthesia complications

## 2011-12-22 NOTE — H&P (Signed)
Jacqueline Winters is a 31 y.o. female presenting for labor. The patient has known fetal malpresentation and was scheduled for a primary cesarean section and bilateral tubal ligation on 12/22/11. She had declined external cephalic version on multiple occassions.  On her last exam she was 4 cm dilated.  This evening she began feeling painful contractions and en route to the hospital spontaneously ruptured her membranes.  Upon arrival to MAU the patient was 6cm dilated and double footling breech.  She was initially transported to L&D because it was felt her delivery was imminent.  In L&D the patient was reassessed.  With a contraction cervix could not be palpated. The patient pushed 2 or 3 times with no descent of the presenting part.  However, after reassessment cervix could be palpated and the cervix was dilated to 6-7. Therefore the decision was made to proceed to the OR for delivery.   History OB History    Grav Para Term Preterm Abortions TAB SAB Ect Mult Living   3 2 2       2      Past Medical History  Diagnosis Date  . Goiter   . Tobacco use disorder   . PONV (postoperative nausea and vomiting)   . SOB (shortness of breath) on exertion     with pregnancy  . GERD (gastroesophageal reflux disease)     with pregnancy  . Headache    Past Surgical History  Procedure Date  . Vaginal delivery 2002, 1998  . Cholecystectomy 2009   Family History: family history is not on file. Social History:  reports that she has been smoking Cigarettes.  She has smoked for the past 10 years. She does not have any smokeless tobacco history on file. She reports that she does not drink alcohol or use illicit drugs.   Prenatal Transfer Tool  Maternal Diabetes: No Genetic Screening: Normal Maternal Ultrasounds/Referrals: Normal Fetal Ultrasounds or other Referrals:  None Maternal Substance Abuse:  No Significant Maternal Medications:  None Significant Maternal Lab Results:  None Other Comments:   None  ROS  Dilation: 6.5 Effacement (%): 100 Exam by:: Dr. Tenny Craw Blood pressure 118/93, pulse 103, temperature 97.8 F (36.6 C), resp. rate 14, height 5\' 4"  (1.626 m), weight 106.142 kg (234 lb), last menstrual period 04/15/2011, SpO2 100.00%. Exam Physical Exam : as above  Prenatal labs: ABO, Rh: --/--/B POS (09/11 1120) Antibody: NEG (09/11 1113) Rubella: Immune (03/05 1019) RPR: Nonreactive (03/05 1019)  HBsAg: Negative (03/05 1019)  HIV: Non-reactive (03/05 1019)  GBS: Negative (08/21 0000)   Assessment/Plan: 31 yo G3P2002 in labor, SROM, with breech presentation 1) Admit 2) to OR for cesarean  Sheng Pritz H. 12/22/2011, 12:12 AM

## 2011-12-22 NOTE — Progress Notes (Addendum)
  Patient is eating, ambulating, foley still in.  Pain control is good.  Filed Vitals:   12/22/11 0518 12/22/11 0617 12/22/11 0618 12/22/11 0619  BP: 106/66 114/71 103/66 114/78  Pulse: 79 69 69 71  Temp: 98.1 F (36.7 C)     TempSrc: Oral     Resp: 16     Height:      Weight:      SpO2: 98%       lungs:   clear to auscultation cor:    RRR Abdomen:  soft, appropriate tenderness, incisions intact and without erythema or exudate ex:    no cords   Lab Results  Component Value Date   WBC 18.2* 12/22/2011   HGB 9.8* 12/22/2011   HCT 28.7* 12/22/2011   MCV 86.7 12/22/2011   PLT 202 12/22/2011    --/--/B POS (09/11 1120)/RI  A/P    Post operative day 0.  Routine post op and postpartum care.  Expect d/c 2 days.  Percocet for pain control.

## 2011-12-22 NOTE — Addendum Note (Signed)
Addendum  created 12/22/11 0857 by Suella Grove, CRNA   Modules edited:Notes Section

## 2011-12-23 NOTE — Progress Notes (Signed)
POD#2 Pt without c/o. Concerned that baby may not be feeding well. Will discuss with Peds. VSSAF Incision- healing well IMP/ Stable PLAN/ Routine care.

## 2011-12-24 MED ORDER — OXYCODONE-ACETAMINOPHEN 5-325 MG PO TABS: 1.0000 | ORAL_TABLET | ORAL | Status: AC | PRN

## 2011-12-24 NOTE — Progress Notes (Signed)
POD#3 Pt and baby are doing well. Ready for discharge. PLAN/ Will discharge to home.

## 2011-12-24 NOTE — Discharge Summary (Signed)
Obstetric Discharge Summary Reason for Admission: onset of labor Prenatal Procedures: ultrasound Intrapartum Procedures: breech extraction and cesarean: low cervical, transverse Postpartum Procedures: none Complications-Operative and Postpartum: none Hemoglobin  Date Value Range Status  12/22/2011 9.8* 12.0 - 15.0 g/dL Final     HCT  Date Value Range Status  12/22/2011 28.7* 36.0 - 46.0 % Final    Physical Exam:  General: alert Lochia: appropriate Uterine Fundus: firm Incision: healing well DVT Evaluation: No evidence of DVT seen on physical exam.  Discharge Diagnoses: Term pregnancy, breech, active labor  Discharge Information: Date: 12/24/2011 Activity: pelvic rest Diet: routine Medications: PNV, Ibuprofen and Percocet Condition: stable Instructions: refer to practice specific booklet Discharge to: home Follow-up Information    Follow up with Almon Hercules., MD. Schedule an appointment as soon as possible for a visit in 4 weeks.   Contact information:   [address] [place] [address] [phone]          Newborn Data: Live born female  Birth Weight: 6 lb 11.4 oz (3045 g) APGAR: 9, 9  Home with mother.  ANDERSON,MARK E 12/24/2011, 6:40 AM

## 2013-03-01 ENCOUNTER — Ambulatory Visit (INDEPENDENT_AMBULATORY_CARE_PROVIDER_SITE_OTHER): Payer: Commercial Managed Care - PPO | Admitting: Medical

## 2013-03-01 ENCOUNTER — Encounter: Payer: Self-pay | Admitting: Medical

## 2013-03-01 VITALS — BP 100/80 | HR 67 | Temp 98.7°F | Resp 18 | Wt 227.0 lb

## 2013-03-01 DIAGNOSIS — R5383 Other fatigue: Secondary | ICD-10-CM

## 2013-03-01 DIAGNOSIS — R5381 Other malaise: Secondary | ICD-10-CM

## 2013-03-01 DIAGNOSIS — R011 Cardiac murmur, unspecified: Secondary | ICD-10-CM

## 2013-03-01 DIAGNOSIS — F17200 Nicotine dependence, unspecified, uncomplicated: Secondary | ICD-10-CM

## 2013-03-01 DIAGNOSIS — E049 Nontoxic goiter, unspecified: Secondary | ICD-10-CM

## 2013-03-01 DIAGNOSIS — E669 Obesity, unspecified: Secondary | ICD-10-CM

## 2013-03-01 LAB — CBC WITH DIFFERENTIAL/PLATELET
Basophils Absolute: 0 10*3/uL (ref 0.0–0.1)
Basophils Relative: 0 % (ref 0–1)
Eosinophils Absolute: 0.1 10*3/uL (ref 0.0–0.7)
HCT: 37 % (ref 36.0–46.0)
Hemoglobin: 12.4 g/dL (ref 12.0–15.0)
MCH: 28.6 pg (ref 26.0–34.0)
MCHC: 33.5 g/dL (ref 30.0–36.0)
Monocytes Absolute: 0.6 10*3/uL (ref 0.1–1.0)
Monocytes Relative: 7 % (ref 3–12)
Neutro Abs: 5.9 10*3/uL (ref 1.7–7.7)
RDW: 14.3 % (ref 11.5–15.5)

## 2013-03-01 LAB — BASIC METABOLIC PANEL
CO2: 26 mEq/L (ref 19–32)
Calcium: 8.9 mg/dL (ref 8.4–10.5)
Chloride: 107 mEq/L (ref 96–112)
Creat: 0.73 mg/dL (ref 0.50–1.10)
Glucose, Bld: 73 mg/dL (ref 70–99)
Sodium: 141 mEq/L (ref 135–145)

## 2013-03-01 LAB — T4: T4, Total: 7.9 ug/dL (ref 5.0–12.5)

## 2013-03-01 NOTE — Progress Notes (Signed)
Subjective: Here for multiple concerns.  She has a history of thyroid goiter, thyroid nodule, prior ultrasound 3 years ago. Lately her neck feels more swollen, has mild change in voice, would like to repeat ultrasound labs at this time.  Obesity-she notes a despite eating healthy and exercising regularly, she is having problems losing weight. Like help with appetite suppressant.  She has a history of a heart murmur, has known about this for many years, but no prior evaluation. She exercises routinely 3-4 days per week without complain of chest pain or shortness of breath, or going up several flights of stairs we'll call shortness of breath.  She reports fatigue. Currently she is working rotating shifts, she is a 38-month-old child, she has trouble adjusting to the schedule   She is smoking about 2 cigarettes per day at this point. She has failed prior attempts at cessation including vapor cigarettes, neck or down.  Objective: General: Well-developed, well-nourished, no acute distress Neck: Supple, nontender, mild to moderate generalized thyromegaly, no lymphadenopathy Heart: 2/6 brief systolic murmur heard best in left upper sternal border, otherwise normal S2, regular rate and rhythm Lungs clear Pulses normal upper and lower extremity Extremities with no edema  Assessment Encounter Diagnoses  Name Primary?  . Thyroid goiter Yes  . Obesity, unspecified   . Heart murmur   . Other malaise and fatigue   . Tobacco use disorder    Plan: Thyroid goiter-labs today, set up for repeat thyroid ultrasound. Consider referral to endocrinology or general surgery pending results Obesity-work on efforts at weight loss through diet and exercise changes, consider medication pending echocardiogram. Heart murmur-history of heart murmur for many years, but no prior evaluation. Given the concern for potential appetite suppressant, we'll send for echocardiogram Fatigue-labs today Tobacco use-recommend  she consider cessation

## 2013-03-04 ENCOUNTER — Ambulatory Visit
Admission: RE | Admit: 2013-03-04 | Discharge: 2013-03-04 | Disposition: A | Discharge status: Home / Self Care | Payer: Commercial Managed Care - PPO | Source: Ambulatory Visit | Attending: Medical | Admitting: Medical

## 2013-03-04 ENCOUNTER — Encounter: Payer: Self-pay | Admitting: Family Medicine

## 2013-03-04 DIAGNOSIS — E049 Nontoxic goiter, unspecified: Secondary | ICD-10-CM

## 2013-03-05 ENCOUNTER — Telehealth: Payer: Self-pay | Admitting: Family Medicine

## 2013-03-05 NOTE — Telephone Encounter (Signed)
That is fine.  Please refer back to endocrine.

## 2013-03-05 NOTE — Telephone Encounter (Signed)
Patient called back and would like to know if she could get a 2nd opinion from a specialists. She would like to consult with her old endocrinologists Dr. Kathrynn Humble. Do you think this would be okay to do first. CLS

## 2013-03-06 NOTE — Telephone Encounter (Signed)
Working on appointment. CLS

## 2013-03-13 ENCOUNTER — Encounter: Payer: Self-pay | Admitting: Medical

## 2013-03-13 ENCOUNTER — Telehealth: Payer: Self-pay | Admitting: Medical

## 2013-04-01 NOTE — Telephone Encounter (Signed)
tsd  °

## 2013-04-16 ENCOUNTER — Ambulatory Visit (INDEPENDENT_AMBULATORY_CARE_PROVIDER_SITE_OTHER): Payer: Commercial Managed Care - PPO | Admitting: Family Medicine

## 2013-04-16 ENCOUNTER — Encounter: Payer: Self-pay | Admitting: Family Medicine

## 2013-04-16 VITALS — BP 120/80 | HR 87 | Wt 222.0 lb

## 2013-04-16 DIAGNOSIS — J358 Other chronic diseases of tonsils and adenoids: Secondary | ICD-10-CM

## 2013-04-16 NOTE — Progress Notes (Signed)
   Subjective:    Patient ID: Jacqueline Winters, female    DOB: 04-20-1980, 33 y.o.   MRN: [id number]  HPI She has a one-day history of feeling as if she has a lump on the left side of her throat. No fever, chills, earache, cough or congestion   Review of Systems     Objective:   Physical Exam Alert and in no distress. Tonsils with noted in the left tonsil which was removed without difficulty. Neck is supple without adenopathy.       Assessment & Plan:  Tonsillith  no further therapy necessary.

## 2013-06-17 ENCOUNTER — Ambulatory Visit (INDEPENDENT_AMBULATORY_CARE_PROVIDER_SITE_OTHER): Payer: Commercial Managed Care - PPO | Admitting: Family Medicine

## 2013-06-17 VITALS — BP 120/60 | HR 52 | Temp 98.9°F | Resp 16 | Ht 64.5 in | Wt 226.0 lb

## 2013-06-17 DIAGNOSIS — R05 Cough: Secondary | ICD-10-CM

## 2013-06-17 DIAGNOSIS — R059 Cough, unspecified: Secondary | ICD-10-CM

## 2013-06-17 DIAGNOSIS — R112 Nausea with vomiting, unspecified: Secondary | ICD-10-CM

## 2013-06-17 LAB — POCT INFLUENZA A/B
INFLUENZA A, POC: NEGATIVE
INFLUENZA B, POC: NEGATIVE

## 2013-06-17 LAB — POCT CBC
GRANULOCYTE PERCENT: 64 % (ref 37–80)
HEMATOCRIT: 37.9 % (ref 37.7–47.9)
HEMOGLOBIN: 11.9 g/dL — AB (ref 12.2–16.2)
Lymph, poc: 2.5 (ref 0.6–3.4)
MCH, POC: 28.1 pg (ref 27–31.2)
MCHC: 31.4 g/dL — AB (ref 31.8–35.4)
MCV: 89.6 fL (ref 80–97)
MID (cbc): 0.5 (ref 0–0.9)
MPV: 8.7 fL (ref 0–99.8)
POC GRANULOCYTE: 5.3 (ref 2–6.9)
POC LYMPH PERCENT: 30.5 %L (ref 10–50)
POC MID %: 5.5 %M (ref 0–12)
Platelet Count, POC: 342 10*3/uL (ref 142–424)
RBC: 4.23 M/uL (ref 4.04–5.48)
RDW, POC: 14.5 %
WBC: 8.3 10*3/uL (ref 4.6–10.2)

## 2013-06-17 NOTE — Patient Instructions (Signed)
Rest and drink plenty of fluids. Let me know if you are not feeling better in the next few days- Sooner if worse.

## 2013-06-17 NOTE — Progress Notes (Signed)
[facility] [address], [place] [address] [phone]  Date:  06/17/2013   Name:  Jacqueline Winters   DOB:  1981/03/03   MRN:  [id number]  PCP:  Wyatt Haste, MD    Chief Complaint: Emesis, Nausea and Nasal Congestion   History of Present Illness:  Jacqueline Winters is a 33 y.o. very pleasant female patient who presents with the following:  Yesterday she noted vomiting and diarrhea.  This is now better, but she still feels weak and notes chest congestion, "dripping" from her nose.  She is concerned that she could have the flu because she was exposed at her job last week.  She worse at [facility] on the dialysis floor.  No ST, however she does note that he nodes are enlarged. Her ear is itchy.   She has some cough, and feels congested.  She has not noted a fever LMP 2/15.   She did have chills yesterday.  She is able to eat again today- just a little bit.  She is tolerating liquids.  Yesterday she noted strrong abdominal cramps but this is much better today.     Here today with he 59 month old daughter Shirlee Limerick  Patient Active Problem List   Diagnosis Date Noted  . Fatigue 03/26/2011  . Urinary frequency 03/26/2011  . Back pain 03/26/2011  . Cough 03/26/2011    Past Medical History  Diagnosis Date  . Goiter   . Tobacco use disorder   . PONV (postoperative nausea and vomiting)   . SOB (shortness of breath) on exertion     with pregnancy  . GERD (gastroesophageal reflux disease)     with pregnancy  . XBJYNWGN(562.1)     Past Surgical History  Procedure Laterality Date  . Vaginal delivery  2002, 1998  . Cholecystectomy  2009  . Cesarean section  12/21/2011    Procedure: CESAREAN SECTION;  Surgeon: Farrel Gobble. Harrington Challenger, MD;  Location: [facility];  Service: Obstetrics;  Laterality: N/A;  Bilateral Tubal Ligation    History  Substance Use Topics  . Smoking status: Current Some Day Smoker -- 10 years    Types: Cigarettes  . Smokeless tobacco: Not on file   . Alcohol Use: No    No family history on file.  Allergies  Allergen Reactions  . Vicodin [Hydrocodone-Acetaminophen]     uneasy    Medication list has been reviewed and updated.  No current outpatient prescriptions on file prior to visit.   No current facility-administered medications on file prior to visit.    Review of Systems:  As per HPI- otherwise negative.   Physical Examination: Filed Vitals:   06/17/13 1356  BP: 120/60  Pulse: 52  Temp: 98.9 F (37.2 C)  Resp: 16   Filed Vitals:   06/17/13 1356  Height: 5' 4.5" (1.638 m)  Weight: 226 lb (102.513 kg)   Body mass index is 38.21 kg/(m^2). Ideal Body Weight: Weight in (lb) to have BMI = 25: 147.6  GEN: WDWN, NAD, Non-toxic, A & O x 3, overweight, does not appear acutely ill HEENT: Atraumatic, Normocephalic. Neck supple. No masses, No LAD.  Bilateral TM wnl, oropharynx normal.  PEERL,EOMI.  Ears and Nose: No external deformity. CV: RRR, No M/G/R. No JVD. No thrill. No extra heart sounds. PULM: CTA B, no wheezes, crackles, rhonchi. No retractions. No resp. distress. No accessory muscle use. ABD: S, NT, ND, +BS. No rebound. No HSM.  Benign abdomen EXTR: No  c/c/e NEURO Normal gait.  PSYCH: Normally interactive. Conversant. Not depressed or anxious appearing.  Calm demeanor.   Results for orders placed in visit on 06/17/13  POCT CBC      Result Value Ref Range   WBC 8.3  4.6 - 10.2 K/uL   Lymph, poc 2.5  0.6 - 3.4   POC LYMPH PERCENT 30.5  10 - 50 %L   MID (cbc) 0.5  0 - 0.9   POC MID % 5.5  0 - 12 %M   POC Granulocyte 5.3  2 - 6.9   Granulocyte percent 64.0  37 - 80 %G   RBC 4.23  4.04 - 5.48 M/uL   Hemoglobin 11.9 (*) 12.2 - 16.2 g/dL   HCT, POC 37.9  37.7 - 47.9 %   MCV 89.6  80 - 97 fL   MCH, POC 28.1  27 - 31.2 pg   MCHC 31.4 (*) 31.8 - 35.4 g/dL   RDW, POC 14.5     Platelet Count, POC 342  142 - 424 K/uL   MPV 8.7  0 - 99.8 fL  POCT INFLUENZA A/B      Result Value Ref Range   Influenza A,  POC Negative     Influenza B, POC Negative      Assessment and Plan: Cough - Plan: POCT Influenza A/B  Nausea and vomiting - Plan: POCT CBC  Likely viral illness.  Negative for flu and CBC reassuring.  She will rest, let me know if not feeling better in the next few days- Sooner if worse.     Signed Lamar Blinks, MD

## 2013-06-24 ENCOUNTER — Ambulatory Visit: Payer: Commercial Managed Care - PPO | Admitting: Medical

## 2013-07-10 ENCOUNTER — Ambulatory Visit (INDEPENDENT_AMBULATORY_CARE_PROVIDER_SITE_OTHER): Payer: Commercial Managed Care - PPO | Admitting: Medical

## 2013-07-10 ENCOUNTER — Encounter: Payer: Self-pay | Admitting: Medical

## 2013-07-10 ENCOUNTER — Other Ambulatory Visit (HOSPITAL_COMMUNITY)
Admission: RE | Admit: 2013-07-10 | Discharge: 2013-07-10 | Disposition: A | Discharge status: Home / Self Care | Payer: Commercial Managed Care - PPO | Source: Ambulatory Visit | Attending: Medical | Admitting: Medical

## 2013-07-10 VITALS — BP 116/72 | HR 72 | Temp 98.0°F | Resp 18 | Ht 64.0 in | Wt 226.0 lb

## 2013-07-10 DIAGNOSIS — E041 Nontoxic single thyroid nodule: Secondary | ICD-10-CM

## 2013-07-10 DIAGNOSIS — Z1151 Encounter for screening for human papillomavirus (HPV): Secondary | ICD-10-CM | POA: Insufficient documentation

## 2013-07-10 DIAGNOSIS — Z01419 Encounter for gynecological examination (general) (routine) without abnormal findings: Secondary | ICD-10-CM | POA: Insufficient documentation

## 2013-07-10 DIAGNOSIS — Z124 Encounter for screening for malignant neoplasm of cervix: Secondary | ICD-10-CM

## 2013-07-10 DIAGNOSIS — J309 Allergic rhinitis, unspecified: Secondary | ICD-10-CM

## 2013-07-10 DIAGNOSIS — E669 Obesity, unspecified: Secondary | ICD-10-CM

## 2013-07-10 DIAGNOSIS — R4586 Emotional lability: Secondary | ICD-10-CM

## 2013-07-10 DIAGNOSIS — F39 Unspecified mood [affective] disorder: Secondary | ICD-10-CM

## 2013-07-10 DIAGNOSIS — R7301 Impaired fasting glucose: Secondary | ICD-10-CM

## 2013-07-10 DIAGNOSIS — Z0000 Encounter for general adult medical examination without abnormal findings: Secondary | ICD-10-CM

## 2013-07-10 DIAGNOSIS — F17200 Nicotine dependence, unspecified, uncomplicated: Secondary | ICD-10-CM

## 2013-07-10 LAB — POCT URINALYSIS DIPSTICK
Bilirubin, UA: NEGATIVE
Blood, UA: NEGATIVE
GLUCOSE UA: NEGATIVE
Ketones, UA: NEGATIVE
Leukocytes, UA: NEGATIVE
NITRITE UA: NEGATIVE
Protein, UA: NEGATIVE
Spec Grav, UA: 1.01
UROBILINOGEN UA: NEGATIVE
pH, UA: 7

## 2013-07-10 LAB — LIPID PANEL
CHOLESTEROL: 153 mg/dL (ref 0–200)
HDL: 50 mg/dL (ref >39)
LDL Cholesterol: 80 mg/dL (ref 0–99)
TRIGLYCERIDES: 115 mg/dL (ref <150)
Total CHOL/HDL Ratio: 3.1 Ratio
VLDL: 23 mg/dL (ref 0–40)

## 2013-07-10 LAB — HEMOGLOBIN A1C
HEMOGLOBIN A1C: 5.7 % — AB (ref <5.7)
MEAN PLASMA GLUCOSE: 117 mg/dL — AB (ref <117)

## 2013-07-10 NOTE — Patient Instructions (Signed)
  Thank you for giving me the opportunity to serve you today.    Your diagnosis today includes: Encounter Diagnoses  Name Primary?  . Routine general medical examination at a health care facility Yes  . Obesity, unspecified   . Thyroid nodule   . Tobacco use disorder   . Allergic rhinitis   . Screening for cervical cancer   . Mood change   . Impaired fasting blood sugar      Specific recommendations today include:  We will call with lab results  We are referring you to interventional radiology for thyroid nodule biopsy  Continue efforts with diet and exercise to lose weight  Pending labs, we will consider medications such as phentermine and Topamax for weight loss  Stop smoking completely  For allergies if desired we can call out a nasal spray, continue over-the-counter antihistamine at bedtime  Return pending labs, referral.

## 2013-07-10 NOTE — Progress Notes (Signed)
Subjective:   HPI  Jacqueline Winters is a 33 y.o. female who presents for a complete physical.  Preventative care: Last ophthalmology visit:unknown  Last dental visit:6/14 Last colonoscopy:N/A Last mammogram:N/A Last gynecological exam:10/13 Last EKG:09/04/07  Last labs:06/17/13  Prior vaccinations: TD or Tdap:11/13 Influenza:9/14 Pneumococcal:9/13 Shingles/Zostavax:N/A  Advanced directive:no Health care power of attorney:no Living will:no  Concerns:weightloss, thyroid nodules and sleep issues.   She has lately been having depressed mood, unhappy with her body, unhappy with the fact that she is obese, can't seem to lose weight despite efforts, has some financial stress at home.  Would like help to lose weight, as she is exercising, limiting calories to 1500 daily, just started using my fitness PAL app recently. She did lose 50 pounds with over-the-counter Deatra Canter in the past.  She is seeing podiatry about a toenail fungus and hammertoe  After her last visit she never followed up to give [place] permission to send for thyroid FNA. At last visit we heard a murmur, here to recheck on this. Needs Pap smear updated.  Reviewed their medical, surgical, family, social, medication, and allergy history and updated chart as appropriate.  Past Medical History  Diagnosis Date  . Goiter   . Tobacco use disorder   . PONV (postoperative nausea and vomiting)   . GERD (gastroesophageal reflux disease)     with pregnancy  . Headache(784.0)   . Insomnia   . Thyroid nodule   . Allergy   . Obesity     Past Surgical History  Procedure Laterality Date  . Vaginal delivery  2002, 1998  . Cholecystectomy  2009  . Cesarean section  12/21/2011    Procedure: CESAREAN SECTION;  Surgeon: Farrel Gobble. Harrington Challenger, MD;  Location: [facility];  Service: Obstetrics;  Laterality: N/A;  Bilateral Tubal Ligation  . Tubal ligation      History   Social History  . Marital Status: Married    Spouse Name: N/A    Number of  Children: N/A  . Years of Education: N/A   Occupational History  . Not on file.   Social History Main Topics  . Smoking status: Current Every Day Smoker -- 0.30 packs/day for 10 years    Types: Cigarettes  . Smokeless tobacco: Not on file  . Alcohol Use: Yes     Comment: 1 glass of wine twice monthly.  . Drug Use: No  . Sexual Activity: Not on file   Other Topics Concern  . Not on file   Social History Narrative   Married, 3 children ages 72 years old, 72 years old, 42 months old, Christian, exercises 2-3 days a week, is a Marine scientist in [employer]    History reviewed. No pertinent family history.  Current outpatient prescriptions:acetaminophen (TYLENOL) 500 MG tablet, Take 1,000 mg by mouth every 6 (six) hours as needed., Disp: , Rfl: ;  cetirizine (ZYRTEC) 10 MG tablet, Take 10 mg by mouth daily., Disp: , Rfl: ;  diphenhydrAMINE (BENADRYL) 25 MG tablet, Take 25 mg by mouth every 6 (six) hours as needed., Disp: , Rfl: ;  ibuprofen (ADVIL,MOTRIN) 200 MG tablet, Take 400 mg by mouth every 6 (six) hours as needed., Disp: , Rfl:   Allergies  Allergen Reactions  . Vicodin [Hydrocodone-Acetaminophen]     uneasy    Review of Systems Constitutional: -fever, -chills, -sweats, -unexpected weight change, +decreased appetite, +fatigue Allergy: +sneezing, +itching, +congestion Dermatology: -changing moles, -+rash, -lumps ENT: -runny nose, -ear pain, +sore throat, -hoarseness, +sinus pain, -  teeth pain, -+ringing in ears, -hearing loss, -nosebleeds Cardiology: -chest pain, -palpitations, -swelling, -difficulty breathing when lying flat, -waking up short of breath Respiratory: +cough, -shortness of breath, +difficulty breathing with exercise or exertion, -wheezing, -coughing up blood Gastroenterology: -abdominal pain, -nausea, -vomiting, -diarrhea, -constipation, -blood in stool, -changes in bowel movement, -difficulty swallowing or eating Hematology: -bleeding, -bruising   Musculoskeletal: -joint aches, -muscle aches, -joint swelling, -back pain, +neck pain, -cramping, -changes in gait Ophthalmology: denies vision changes, eye redness, itching, +discharge Urology: -burning with urination, -difficulty urinating, -blood in urine, -urinary frequency, -urgency, -incontinence Neurology: +headache, -weakness, -tingling, +numbness, -memory loss, -falls, -dizziness Psychology: -depressed mood, -agitation, -sleep problems     Objective:   Physical Exam  BP 116/72  Pulse 72  Temp(Src) 98 F (36.7 C)  Resp 18  Ht 5\' 4"  (1.626 m)  Wt 226 lb (102.513 kg)  BMI 38.77 kg/m2  LMP 06/23/2013  Breastfeeding? No   General appearance: alert, no distress, WD/WN, obese African American female Skin: Right upper quadrant surgical scars, few scattered macules, no worrisome lesions HEENT: normocephalic, conjunctiva/corneas normal, sclerae anicteric, PERRLA, EOMi, nares patent, no discharge or erythema, pharynx normal Oral cavity: MMM, tongue normal, teeth normal Neck: supple, no lymphadenopathy, no thyromegaly, no masses, normal ROM, no bruits Chest: non tender, normal shape and expansion Heart: RRR, normal S1, S2, no obvious murmurs Lungs: CTA bilaterally, no wheezes, rhonchi, or rales Abdomen: +bs, soft, non tender, non distended, no masses, no hepatomegaly, no splenomegaly, no bruits Back: non tender, normal ROM, no scoliosis Musculoskeletal: Bilateral great toes deviate laterally causing overlap with second toes, there are dorsal calluses of bilateral second toe proximal phalanx, otherwise upper extremities non tender, no obvious deformity, normal ROM throughout, lower extremities non tender, no obvious deformity, normal ROM throughout Extremities: no edema, no cyanosis, no clubbing Pulses: 2+ symmetric, upper and lower extremities, normal cap refill Neurological: alert, oriented x 3, CN2-12 intact, strength normal upper extremities and lower extremities, sensation  normal throughout, DTRs 2+ throughout, no cerebellar signs, gait normal Psychiatric: normal affect, a little tearful at times, pleasant  Breast: nontender, no masses or lumps, no skin changes, no nipple discharge or inversion, no axillary lymphadenopathy Gyn: Normal external genitalia without lesions, vagina with normal mucosa, cervix with mild erythema patchy, no cervical motion tenderness, no abnormal vaginal discharge.  Uterus and adnexa not enlarged, nontender, no masses.  Pap performed.  Exam chaperoned by nurse. Rectal: Anus normal appearing    Assessment and Plan :    Encounter Diagnoses  Name Primary?  . Routine general medical examination at a health care facility Yes  . Obesity, unspecified   . Thyroid nodule   . Tobacco use disorder   . Allergic rhinitis   . Screening for cervical cancer   . Mood change   . Impaired fasting blood sugar       Physical exam - discussed healthy lifestyle, diet, exercise, preventative care, vaccinations, and addressed their concerns.  Handout given.  Specific recommendations today include:  We will call with lab results  We are referring you to interventional radiology for thyroid nodule biopsy  Continue efforts with diet and exercise to lose weight  Pending labs, we will consider medications such as phentermine and Topamax for weight loss  Stop smoking completely  For allergies if desired we can call out a nasal spray, continue over-the-counter antihistamine at bedtime  Follow-up pending labs

## 2013-07-10 NOTE — Progress Notes (Deleted)
Subjective:   HPI  Jacqueline Winters is a 33 y.o. female who presents for a complete physical.  Medical care team includes:     Preventative care: Last ophthalmology visit: Last dental visit: Last colonoscopy: Last mammogram: Last gynecological exam: Last EKG: Last labs:  Prior vaccinations: TD or Tdap: Influenza: Pneumococcal: Shingles/Zostavax: Other:   Advanced directive: Health care power of attorney: Living will:  Concerns:   Reviewed their medical, surgical, family, social, medication, and allergy history and updated chart as appropriate.    Review of Systems Constitutional: -fever, -chills, -sweats, -unexpected weight change, -decreased appetite, -fatigue Allergy: -sneezing, -itching, -congestion Dermatology: -changing moles, --rash, -lumps ENT: -runny nose, -ear pain, -sore throat, -hoarseness, -sinus pain, -teeth pain, - ringing in ears, -hearing loss, -nosebleeds Cardiology: -chest pain, -palpitations, -swelling, -difficulty breathing when lying flat, -waking up short of breath Respiratory: -cough, -shortness of breath, -difficulty breathing with exercise or exertion, -wheezing, -coughing up blood Gastroenterology: -abdominal pain, -nausea, -vomiting, -diarrhea, -constipation, -blood in stool, -changes in bowel movement, -difficulty swallowing or eating Hematology: -bleeding, -bruising  Musculoskeletal: -joint aches, -muscle aches, -joint swelling, -back pain, -neck pain, -cramping, -changes in gait Ophthalmology: denies vision changes, eye redness, itching, discharge Urology: -burning with urination, -difficulty urinating, -blood in urine, -urinary frequency, -urgency, -incontinence Neurology: -headache, -weakness, -tingling, -numbness, -memory loss, -falls, -dizziness Psychology: -depressed mood, -agitation, -sleep problems     Objective:   Physical Exam  There were no vitals filed for this visit.  General appearance: alert, no distress, WD/WN,   Skin:  HEENT: normocephalic, conjunctiva/corneas normal, sclerae anicteric, PERRLA, EOMi, nares patent, no discharge or erythema, pharynx normal Oral cavity: MMM, tongue normal, teeth normal Neck: supple, no lymphadenopathy, no thyromegaly, no masses, normal ROM Chest: non tender, normal shape and expansion Heart: RRR, normal S1, S2, no murmurs Lungs: CTA bilaterally, no wheezes, rhonchi, or rales Abdomen: +bs, soft, non tender, non distended, no masses, no hepatomegaly, no splenomegaly, no bruits Back: non tender, normal ROM, no scoliosis Musculoskeletal: upper extremities non tender, no obvious deformity, normal ROM throughout, lower extremities non tender, no obvious deformity, normal ROM throughout Extremities: no edema, no cyanosis, no clubbing Pulses: 2+ symmetric, upper and lower extremities, normal cap refill Neurological: alert, oriented x 3, CN2-12 intact, strength normal upper extremities and lower extremities, sensation normal throughout, DTRs 2+ throughout, no cerebellar signs, gait normal Psychiatric: normal affect, behavior normal, pleasant  Breast: nontender, no masses or lumps, no skin changes, no nipple discharge or inversion, no axillary lymphadenopathy Gyn: Normal external genitalia without lesions, vagina with normal mucosa, cervix without lesions, no cervical motion tenderness, no abnormal vaginal discharge.  Uterus and adnexa not enlarged, nontender, no masses.  Pap performed.  Exam chaperoned by nurse. Rectal:     Assessment and Plan :       Physical exam - discussed healthy lifestyle, diet, exercise, preventative care, vaccinations, and addressed their concerns.  Handout given.    Follow-up

## 2013-07-11 ENCOUNTER — Other Ambulatory Visit: Payer: Self-pay | Admitting: Medical

## 2013-07-11 LAB — T4, FREE: Free T4: 1.16 ng/dL (ref 0.80–1.80)

## 2013-07-11 LAB — TSH: TSH: 0.476 u[IU]/mL (ref 0.350–4.500)

## 2013-07-11 MED ORDER — PHENTERMINE HCL 37.5 MG PO TABS: 37.5000 mg | ORAL_TABLET | Freq: Every day | ORAL | Status: DC

## 2013-07-11 MED ORDER — TOPIRAMATE 25 MG PO TABS: 25.0000 mg | ORAL_TABLET | Freq: Every day | ORAL | Status: DC

## 2013-07-16 ENCOUNTER — Telehealth: Payer: Self-pay | Admitting: Family Medicine

## 2013-07-16 ENCOUNTER — Other Ambulatory Visit: Payer: Self-pay | Admitting: Medical

## 2013-07-16 DIAGNOSIS — E041 Nontoxic single thyroid nodule: Secondary | ICD-10-CM

## 2013-07-16 NOTE — Telephone Encounter (Signed)
I FAX OVER HER REFERRAL TO [facility] AND THEY WILL CONTACT HER TO SET UP HER APPOINTMENT. CLS

## 2013-07-16 NOTE — Telephone Encounter (Signed)
Message copied by Armanda Magic on Tue Jul 16, 2013  1:43 PM ------      Message from: Carlena Hurl      Created: Wed Jul 10, 2013  2:52 PM       Refer to interventional radiologist for fine-needle biopsy of thyroid nodule - [facility] ------

## 2013-07-18 ENCOUNTER — Telehealth: Payer: Self-pay | Admitting: Medical

## 2013-07-24 ENCOUNTER — Other Ambulatory Visit (HOSPITAL_COMMUNITY)
Admission: RE | Admit: 2013-07-24 | Discharge: 2013-07-24 | Disposition: A | Discharge status: Home / Self Care | Payer: Commercial Managed Care - PPO | Source: Ambulatory Visit | Attending: Interventional Radiology | Admitting: Interventional Radiology

## 2013-07-24 ENCOUNTER — Ambulatory Visit
Admission: RE | Admit: 2013-07-24 | Discharge: 2013-07-24 | Disposition: A | Discharge status: Home / Self Care | Payer: Commercial Managed Care - PPO | Source: Ambulatory Visit | Attending: Medical | Admitting: Medical

## 2013-07-24 DIAGNOSIS — E041 Nontoxic single thyroid nodule: Secondary | ICD-10-CM

## 2013-07-25 NOTE — Telephone Encounter (Signed)
fyi

## 2013-11-12 ENCOUNTER — Ambulatory Visit (INDEPENDENT_AMBULATORY_CARE_PROVIDER_SITE_OTHER): Payer: Commercial Managed Care - PPO | Admitting: Medical

## 2013-11-12 ENCOUNTER — Encounter: Payer: Self-pay | Admitting: Medical

## 2013-11-12 VITALS — BP 100/70 | HR 72 | Temp 98.0°F | Resp 14 | Wt 229.0 lb

## 2013-11-12 DIAGNOSIS — M25512 Pain in left shoulder: Principal | ICD-10-CM

## 2013-11-12 DIAGNOSIS — M25519 Pain in unspecified shoulder: Secondary | ICD-10-CM

## 2013-11-12 DIAGNOSIS — M25511 Pain in right shoulder: Secondary | ICD-10-CM

## 2013-11-12 DIAGNOSIS — IMO0001 Reserved for inherently not codable concepts without codable children: Secondary | ICD-10-CM

## 2013-11-12 DIAGNOSIS — S46819A Strain of other muscles, fascia and tendons at shoulder and upper arm level, unspecified arm, initial encounter: Secondary | ICD-10-CM

## 2013-11-12 DIAGNOSIS — IMO0002 Reserved for concepts with insufficient information to code with codable children: Secondary | ICD-10-CM

## 2013-11-12 DIAGNOSIS — S43409A Unspecified sprain of unspecified shoulder joint, initial encounter: Secondary | ICD-10-CM

## 2013-11-12 DIAGNOSIS — S46219A Strain of muscle, fascia and tendon of other parts of biceps, unspecified arm, initial encounter: Secondary | ICD-10-CM

## 2013-11-12 DIAGNOSIS — S43499A Other sprain of unspecified shoulder joint, initial encounter: Secondary | ICD-10-CM

## 2013-11-12 MED ORDER — TRAMADOL HCL 50 MG PO TABS: 50.0000 mg | ORAL_TABLET | Freq: Three times a day (TID) | ORAL | Status: DC | PRN

## 2013-11-12 MED ORDER — NAPROXEN 500 MG PO TABS: 500.0000 mg | ORAL_TABLET | Freq: Two times a day (BID) | ORAL | Status: DC

## 2013-11-12 NOTE — Progress Notes (Signed)
Subjective: Here for bilat shoulder pain.  Currently having pain in both shoulders, started in the left shoulder 10/31/13, but then progressed to the right shoulder as well.  She attributes the pain to a work injury on 10/31/13. 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  The patient felt dizzy, started to fall, and Mrs. Jacqueline Winters returns to catch the patient in the process of falling.  She called the patient with her arms in a twisting fashion.  She didn't recall immediate pain at that time, but by the next day he started having some pain in left shoulder only. But the pain woke her up out of the bed that night.  Gradually started having right shoulder pain as well. She ended up seeing [facility]/urgent care per employer instructions on 11/02/13.  They evaluated her, gave her a shot of steroid in the buttock, gave her prescription for Naprosyn and some home exercises to begin.  She saw a little improvement but then Thursday 5 days ago started having worse pain again but now the pain is worse in the right shoulder. Still has bilateral shoulder pain, worse with lifting pulling or motion over 80.  She finished the Naprosyn, she is still trying to do the home exercises. She contacted the employer again, had interview on the phone with [employer]/employer claims.   At this point she was told that evaluation thus far suggests that her symptoms were not related to the work incident on 7/23.  Was advised to followup with her primary care provider.  At this point she is having a lot of pain in both shoulders, but R>L.  there is a constant dull ache in both shoulders, worse pain at night, spasm in the left bicep, aching and burning sensations of both shoulders.  She denies numbness, tingling, weakness, no other trauma or injury. No other aggravating or relieving factor. No other complaint.  Review of systems as in  subjective  Objective: General: Well-developed, well-nourished, no acute distress Skin: Unremarkable no erythema or warmth MSK: Tender over bilateral biceps origin, pain in both shoulders with passive range of motion above 80, with active range of motion pain with shoulder flexion and abduction over 80 degrees, worse on the right, internal and external range of motion on the right is decreased somewhat, worse than the left. Otherwise arms nontender, normal range of motion otherwise at elbow and wrist and fingers. Neck: Supple, nontender, no mass, no thyromegaly No extremity edema  Arms are neurovascularly Back nontender, normal range of motion  Assessment: Encounter Diagnoses  Name Primary?  . Shoulder pain, bilateral Yes  . Sprain shoulder/arm, unspecified laterality, initial encounter   . Biceps strain, unspecified laterality, initial encounter     Plan: We discussed her mechanism of injury, discussed her exam findings and symptoms would suggest bilateral shoulder sprain and strain injury as well as biceps strain injury. No imaging indicated at this point  Specific recommendations today include:  Begin Naprosyn twice daily for 10 day  You may use the Ultram pain medication as needed up to every 6 hours, but alternating by at least 4 hours with Naprosyn  Use ice to both shoulder areas 20 minutes at a time, twice daily  Consider using an arm sling periodically throughout the day to help rest the shoulders  Avoid heavy lifting, pulling, or pushing until symptoms resolve  No lifting over 15 pounds  Recheck in one week

## 2013-11-12 NOTE — Progress Notes (Signed)
I called out the patients Phentermine to her pharmacy per Chana Bode PAC. CLS

## 2013-11-12 NOTE — Patient Instructions (Signed)
  Thank you for giving me the opportunity to serve you today.    Your diagnosis today includes: Encounter Diagnoses  Name Primary?  . Shoulder pain, bilateral Yes  . Sprain shoulder/arm, unspecified laterality, initial encounter   . Biceps strain, unspecified laterality, initial encounter      Specific recommendations today include:  Begin Naprosyn twice daily for 10 day  You may use the Ultram pain medication as needed up to every 6 hours, but alternating by at least 4 hours with Naprosyn  Use ice to both shoulder areas 20 minutes at a time, twice daily  Consider using an arm sling periodically throughout the day to help rest the shoulders  Avoid heavy lifting, pulling, or pushing until symptoms resolve  No lifting over 15 pounds  Recheck in one week

## 2013-11-15 ENCOUNTER — Telehealth: Payer: Self-pay | Admitting: Medical

## 2013-11-15 NOTE — Telephone Encounter (Signed)
I received FMLA paperwork.  At her most recent visit I asked her to do some modification of work, no lifting over 15 pounds for a week.  I gave her a handout attesting to that which should suffice for a work note  I did not or have not taken her out of work.  This is really in my opinion a worker's comp issue.  Since I have not taken her out of work or asked her to be out of work, it would be hard for me to complete the FMLA forms as this relates to work missed.  He work she minutes prior to my evaluation with her would fall under the worker's comp evaluation.  Jacqueline Winters - Please help figure this out.

## 2013-11-18 ENCOUNTER — Telehealth: Payer: Self-pay | Admitting: Family Medicine

## 2013-11-18 ENCOUNTER — Ambulatory Visit (INDEPENDENT_AMBULATORY_CARE_PROVIDER_SITE_OTHER): Payer: Commercial Managed Care - PPO | Admitting: Medical

## 2013-11-18 ENCOUNTER — Encounter: Payer: Self-pay | Admitting: Medical

## 2013-11-18 VITALS — BP 100/70 | HR 107 | Temp 98.8°F | Resp 16 | Wt 225.0 lb

## 2013-11-18 DIAGNOSIS — M25511 Pain in right shoulder: Secondary | ICD-10-CM

## 2013-11-18 DIAGNOSIS — M25519 Pain in unspecified shoulder: Secondary | ICD-10-CM

## 2013-11-18 DIAGNOSIS — IMO0002 Reserved for concepts with insufficient information to code with codable children: Secondary | ICD-10-CM

## 2013-11-18 DIAGNOSIS — S46919D Strain of unspecified muscle, fascia and tendon at shoulder and upper arm level, unspecified arm, subsequent encounter: Secondary | ICD-10-CM

## 2013-11-18 DIAGNOSIS — M25512 Pain in left shoulder: Principal | ICD-10-CM

## 2013-11-18 DIAGNOSIS — Z5189 Encounter for other specified aftercare: Secondary | ICD-10-CM

## 2013-11-18 DIAGNOSIS — M7520 Bicipital tendinitis, unspecified shoulder: Secondary | ICD-10-CM

## 2013-11-18 NOTE — Telephone Encounter (Signed)
Patient has an appointment at [facility]. On 11/19/13 @ 1100 am to see Dr. Conan Bowens. CLS Patient is aware of the appointment. CLS

## 2013-11-18 NOTE — Progress Notes (Signed)
Subjective: Here for recheck on bilat shoulder pain.  I saw her on 11/12/13 for the same. She really doesn't have much of any improvement. Felt a little better yesterday but really not much improved since the last visit.  Still having bilateral shoulder pain, can't lift her 80 without pain. She is using the treatment we discussed last time including ice twice daily, Naprosyn twice daily, Ultram a few times a day, trying to rest the arms.  Since last visit she said that she spoke with her employer and there was a concern about not really being able to accommodate her not being able to lift over 15 pounds.  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   She didn't want to risk them or her being injured.   She apparently took a few days off work for this instead of there being some arrangement for her to work with the restrictions we discussed last visit.     History from last visit: Currently having pain in both shoulders, started in the left shoulder 10/31/13, but then progressed to the right shoulder as well.  She attributes the pain to a work injury on 10/31/13. 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  The patient felt dizzy, started to fall, and Mrs. Budney returns to catch the patient in the process of falling.  She called the patient with her arms in a twisting fashion.  She didn't recall immediate pain at that time, but by the next day he started having some pain in left shoulder only. But the pain woke her up out of the bed that night.  Gradually started having right shoulder pain as well. She ended up seeing [facility]urgent care per employer instructions on 11/02/13.  They evaluated her, gave her a shot of steroid in the buttock, gave her prescription for Naprosyn and some home exercises to begin.  She saw a little  improvement but then Thursday 5 days ago started having worse pain again but now the pain is worse in the right shoulder. Still has bilateral shoulder pain, worse with lifting pulling or motion over 80.  She finished the Naprosyn, she is still trying to do the home exercises. She contacted the employer again, had interview on the phone with [employer]/employer claims.   At this point she was told that evaluation thus far suggests that her symptoms were not related to the work incident on 7/23.  Was advised to followup with her primary care provider.  At this point she is having a lot of pain in both shoulders, but R>L.  there is a constant dull ache in both shoulders, worse pain at night, spasm in the left bicep, aching and burning sensations of both shoulders.  She denies numbness, tingling, weakness, no other trauma or injury. No other aggravating or relieving factor. No other complaint.  Review of systems as in subjective  Objective: General: Well-developed, well-nourished, no acute distress Skin: Unremarkable no erythema or warmth MSK: Tender over bilateral biceps origin, pain in both shoulders with passive range of motion above 80, with active range of motion pain with shoulder flexion and abduction over 80 degrees, worse on the right, internal and external range of motion on the right is decreased somewhat, worse than the left. Otherwise arms nontender, normal range  of motion otherwise at elbow and wrist and fingers. Neck: Supple, nontender, no mass, no thyromegaly No extremity edema  Arms are neurovascularly Back nontender, normal range of motion  Assessment: Encounter Diagnoses  Name Primary?  . Shoulder pain, bilateral Yes  . Shoulder strain, unspecified laterality, subsequent encounter   . Biceps tendonitis, unspecified laterality     Plan: i advised that I did not take her out of work, thus, I have no reason to complete and FMLA form.  I did however, write work restrictions last week  and today to limit lifting to <15lb, and to conduct her day to day work duties except any transferring of patient, lifting patients.  At this point, referral to orthopedics for further eval and management.  C/t Naprosyn, ice, relative rest, work restrictions.

## 2013-12-25 ENCOUNTER — Other Ambulatory Visit: Payer: Self-pay | Admitting: Medical

## 2013-12-26 NOTE — Telephone Encounter (Signed)
LM to CB needs OV per Audelia Acton.

## 2013-12-26 NOTE — Telephone Encounter (Signed)
OK to RF

## 2013-12-26 NOTE — Telephone Encounter (Signed)
LM to CB [facility] 

## 2013-12-30 ENCOUNTER — Other Ambulatory Visit: Payer: Self-pay | Admitting: Medical

## 2013-12-31 NOTE — Telephone Encounter (Signed)
DR.LALONDE IS THIS OKAY 

## 2013-12-31 NOTE — Telephone Encounter (Signed)
[place].LALONDE TOLD ME TO SEND THIS TO YOU PT ADVISED THAT YOU WILL BE BACK Monday IT WILL BE HANDLED THEN

## 2013-12-31 NOTE — Telephone Encounter (Signed)
It looks like she has not been taking this regularly. Let's let Audelia Acton handle this when he comes back. Let the patient and know that and send of this on to [facility]

## 2014-01-05 NOTE — Telephone Encounter (Signed)
Needs appt f/u for weight loss medication

## 2014-01-06 NOTE — Telephone Encounter (Signed)
I left the patient a message to call out office to schedule her follow up appointment. CLS

## 2014-01-31 ENCOUNTER — Other Ambulatory Visit: Payer: Self-pay | Admitting: Medical

## 2014-01-31 NOTE — Telephone Encounter (Signed)
Is this okay to refill? 

## 2014-02-03 ENCOUNTER — Ambulatory Visit (INDEPENDENT_AMBULATORY_CARE_PROVIDER_SITE_OTHER): Payer: Commercial Managed Care - PPO | Admitting: Medical

## 2014-02-03 ENCOUNTER — Encounter: Payer: Self-pay | Admitting: Medical

## 2014-02-03 VITALS — BP 100/80 | HR 60 | Temp 98.1°F | Resp 16 | Wt 231.0 lb

## 2014-02-03 DIAGNOSIS — E669 Obesity, unspecified: Secondary | ICD-10-CM

## 2014-02-03 DIAGNOSIS — R5381 Other malaise: Secondary | ICD-10-CM

## 2014-02-03 DIAGNOSIS — R5383 Other fatigue: Secondary | ICD-10-CM

## 2014-02-03 MED ORDER — TOPIRAMATE 50 MG PO TABS: 50.0000 mg | ORAL_TABLET | Freq: Two times a day (BID) | ORAL | Status: DC

## 2014-02-03 MED ORDER — PHENTERMINE HCL 37.5 MG PO TABS: 37.5000 mg | ORAL_TABLET | Freq: Every day | ORAL | Status: DC

## 2014-02-03 MED ORDER — VITAMIN B-12 1000 MCG PO TABS: 1000.0000 ug | ORAL_TABLET | Freq: Every day | ORAL | Status: DC

## 2014-02-03 NOTE — Patient Instructions (Signed)
  Thank you for giving me the opportunity to serve you today.    Specific recommendations today include: Diet  Increase your water intake, get at least 64 ounces of water daily  Eat 3-4 fruits daily  Eat plenty of vegetables throughout the day, preferably each meal  Eat good sources of grains such as oatmeal, barley, whole grain pasta, whole grain bread, but limit the serving size to 1 cup of oatmeal or pasta per meal or 2 slices of bread per meal  We don't need to meat at each meal, however if you do eat meat, limit serving size to the size of your palm, and eat chicken fish or [place], lean cuts of meat  Eat beans every day as this is a good nutrient source and helps to curb appetite  Consider using a program such as Weight Watchers  Consider using a Smart phone app such as My Fitness PAL or [employer] to track your calories and progress   Things to limit or avoid:  Avoid fast food, fried foods, fatty foods  Limit sweets, ice cream, cake and other baked goods  Avoid soda, beer, alcohol, sweet tea  Exercise  You need to be exercising most days of the week for 30-45 minutes or more  Good forms of exercise include walking, hiking, stationary bike or bicycling outside, lap swimming, aerobics class, dance, Zumba  Consider getting a trainer at a gym to help with exercise  Medication  Begin back on Phentermine once daily in the morning  Begin Topamax 50mg , 1/2 tablet daily at bedtime for 1 week, then 1 tablet daily at bedtime  Begin B12 1000mg  daily   Consider weighing yourself daily to keep track of your weight   Return 1-64mo.

## 2014-02-03 NOTE — Progress Notes (Signed)
Subjective: Obesity: Here for f/u regarding weight loss efforts.  Last visit for the same was April 2015 Current diet: in general, a "healthy" diet  , but does have a sweet tooth Current exercise: walking Current medications to assist weight loss efforts: was using Phentermine + Topamax generics, combination of both back in April through early summer which did help with appetite suppression and energy level.  Current problems is working four 12 hour shifts and feeling tired all the time.  Has enrolled in biggest loser challenge with coworkers.    Wt Readings from Last 3 Encounters:  02/03/14 231 lb (104.781 kg)  11/18/13 225 lb (102.059 kg)  11/12/13 229 lb (103.874 kg)   History of eating disorders: none.  Obesity associated medical conditions: thyroid goiter. Current or past medications associated with causing obesity: none.   Past Medical History  Diagnosis Date  . Goiter   . Tobacco use disorder   . PONV (postoperative nausea and vomiting)   . GERD (gastroesophageal reflux disease)     with pregnancy  . Headache(784.0)   . Insomnia   . Thyroid nodule   . Allergy   . Obesity      Objective: Filed Vitals:   02/03/14 0810  BP: 100/80  Pulse: 60  Temp: 98.1 F (36.7 C)  Resp: 16    General appearance: alert, no distress, WD/WN,  Heart: RRR, normal S1, S2, no murmurs Lungs: CTA bilaterally, no wheezes, rhonchi, or rales Pulses: 2+ symmetric, upper and lower extremities, normal cap refill Ext: no edema   Assessment: Encounter Diagnoses  Name Primary?  . Obesity Yes  . Malaise and fatigue    Plan:   General weight loss/lifestyle modification strategies discussed including diet, exercise, medications, identify weaknesses, use non-food rewards, set short term goals.   Behavioral treatment: Advised stress reduction where possible, make sure sleep is consistent.  Diet interventions: moderate (500 kCal/d) deficit diet.  Informal exercise measures discussed, e.g.  taking stairs instead of elevator.  Regular aerobic exercise program discussed, including walking daily, challenging self with others with biggest loser challenge at work.   Discussed medication options.  Discussed risks/benefits of medications. Restart Phentermine/Topamax combo, B12.   Of note she is s/p tubal ligation.  Follow up in 1 mo

## 2014-02-10 ENCOUNTER — Encounter: Payer: Self-pay | Admitting: Medical

## 2014-02-13 ENCOUNTER — Ambulatory Visit (INDEPENDENT_AMBULATORY_CARE_PROVIDER_SITE_OTHER): Payer: Commercial Managed Care - PPO | Admitting: Emergency Medicine

## 2014-02-13 VITALS — BP 110/80 | HR 84 | Temp 98.6°F | Resp 12 | Ht 63.5 in | Wt 223.0 lb

## 2014-02-13 DIAGNOSIS — J0100 Acute maxillary sinusitis, unspecified: Secondary | ICD-10-CM

## 2014-02-13 DIAGNOSIS — J209 Acute bronchitis, unspecified: Secondary | ICD-10-CM

## 2014-02-13 MED ORDER — AMOXICILLIN-POT CLAVULANATE 875-125 MG PO TABS: 1.0000 | ORAL_TABLET | Freq: Two times a day (BID) | ORAL | Status: DC

## 2014-02-13 MED ORDER — PROMETHAZINE-CODEINE 6.25-10 MG/5ML PO SYRP: 5.0000 mL | ORAL_SOLUTION | Freq: Four times a day (QID) | ORAL | Status: DC | PRN

## 2014-02-13 MED ORDER — PSEUDOEPHEDRINE-GUAIFENESIN ER 60-600 MG PO TB12
1.0000 | ORAL_TABLET | Freq: Two times a day (BID) | ORAL | Status: DC
Start: 2014-02-13 — End: 2014-07-25

## 2014-02-13 NOTE — Patient Instructions (Signed)

## 2014-02-13 NOTE — Progress Notes (Signed)
[facility] [address] [place] [address] [phone]  Date:  02/13/2014   Name:  Jacqueline Winters   DOB:  03-Feb-1981   MRN:  [id number]  PCP:  Wyatt Haste, MD    Chief Complaint: Cough; Dizziness; Fatigue; Nasal Congestion; Chest Pain; and Chills   History of Present Illness:  Jacqueline Winters is a 33 y.o. very pleasant female patient who presents with the following:  Three day history of nasal congestion and foul tasting post nasal drip.  Has a sore throat and headache Cough productive of purulent sputum.  Some wheezing at night no shortness of breath. No nausea or vomiting.   Fever at times and feels chilled. No improvement with over the counter medications or other home remedies.  Denies other complaint or health concern today.   Patient Active Problem List   Diagnosis Date Noted  . Obesity, unspecified 07/10/2013  . Thyroid nodule 07/10/2013  . Tobacco use disorder 07/10/2013  . Allergic rhinitis 07/10/2013  . Fatigue 03/26/2011  . Urinary frequency 03/26/2011  . Back pain 03/26/2011  . Cough 03/26/2011    Past Medical History  Diagnosis Date  . Goiter   . Tobacco use disorder   . PONV (postoperative nausea and vomiting)   . GERD (gastroesophageal reflux disease)     with pregnancy  . Headache(784.0)   . Insomnia   . Thyroid nodule   . Allergy   . Obesity     Past Surgical History  Procedure Laterality Date  . Vaginal delivery  2002, 1998  . Cholecystectomy  2009  . Cesarean section  12/21/2011    Procedure: CESAREAN SECTION;  Surgeon: Farrel Gobble. Harrington Challenger, MD;  Location: [facility];  Service: Obstetrics;  Laterality: N/A;  Bilateral Tubal Ligation  . Tubal ligation      History  Substance Use Topics  . Smoking status: Current Every Day Smoker -- 0.30 packs/day for 10 years    Types: Cigarettes  . Smokeless tobacco: Never Used  . Alcohol Use: 0.0 oz/week    0 Not specified per week     Comment: 1 glass of wine twice monthly.     No family history on file.  Allergies  Allergen Reactions  . Vicodin [Hydrocodone-Acetaminophen]     uneasy    Medication list has been reviewed and updated.  Current Outpatient Prescriptions on File Prior to Visit  Medication Sig Dispense Refill  . acetaminophen (TYLENOL) 500 MG tablet Take 1,000 mg by mouth every 6 (six) hours as needed.    . cetirizine (ZYRTEC) 10 MG tablet Take 10 mg by mouth daily.    . Coconut Oil OIL by Does not apply route.    . COD LIVER OIL PO Take by mouth.    Marland Kitchen ibuprofen (ADVIL,MOTRIN) 200 MG tablet Take 400 mg by mouth every 6 (six) hours as needed.    . phentermine (ADIPEX-P) 37.5 MG tablet Take 1 tablet (37.5 mg total) by mouth daily before breakfast. 30 tablet 1  . topiramate (TOPAMAX) 50 MG tablet Take 1 tablet (50 mg total) by mouth 2 (two) times daily. 30 tablet 1  . vitamin B-12 (CYANOCOBALAMIN) 1000 MCG tablet Take 1 tablet (1,000 mcg total) by mouth daily. 30 tablet 3   No current facility-administered medications on file prior to visit.    Review of Systems:  As per HPI, otherwise negative.    Physical Examination: Filed Vitals:   02/13/14 1155  BP: 110/80  Pulse: 84  Temp: 98.6 F (37 C)  Resp: 12   Filed Vitals:   02/13/14 1155  Height: 5' 3.5" (1.613 m)  Weight: 223 lb (101.152 kg)   Body mass index is 38.88 kg/(m^2). Ideal Body Weight: Weight in (lb) to have BMI = 25: 143.1  GEN: WDWN, NAD, Non-toxic, A & O x 3 HEENT: Atraumatic, Normocephalic. Neck supple. No masses, No LAD. Ears and Nose: No external deformity. CV: RRR, No M/G/R. No JVD. No thrill. No extra heart sounds. PULM: CTA B, no wheezes, crackles, rhonchi. No retractions. No resp. distress. No accessory muscle use. ABD: S, NT, ND, +BS. No rebound. No HSM. EXTR: No c/c/e NEURO Normal gait.  PSYCH: Normally interactive. Conversant. Not depressed or anxious appearing.  Calm demeanor.    Assessment and Plan: Sinusitis Bronchitis augmentin mucinex  d Phen c cod   Signed,  Ellison Carwin, MD

## 2014-04-23 ENCOUNTER — Telehealth: Payer: Self-pay | Admitting: Medical

## 2014-05-05 ENCOUNTER — Telehealth: Payer: Self-pay | Admitting: Medical

## 2014-05-05 NOTE — Telephone Encounter (Signed)
P.A. Approved til 08/02/14 and backdated one month to cover pt's out of pocket expense.

## 2014-05-06 NOTE — Telephone Encounter (Signed)
Pt informed, faxed pharmacy  °

## 2014-05-08 NOTE — Telephone Encounter (Signed)
lm

## 2014-06-09 ENCOUNTER — Other Ambulatory Visit: Payer: Self-pay | Admitting: Medical

## 2014-07-07 ENCOUNTER — Other Ambulatory Visit: Payer: Self-pay | Admitting: Medical

## 2014-07-07 NOTE — Telephone Encounter (Signed)
Is this okay to refill? 

## 2014-07-08 ENCOUNTER — Telehealth: Payer: Self-pay | Admitting: Medical

## 2014-07-08 NOTE — Telephone Encounter (Signed)
Set up CPX appt for april

## 2014-07-08 NOTE — Telephone Encounter (Signed)
See msg. From [facility]

## 2014-07-25 ENCOUNTER — Ambulatory Visit (INDEPENDENT_AMBULATORY_CARE_PROVIDER_SITE_OTHER): Payer: Commercial Managed Care - PPO | Admitting: Medical

## 2014-07-25 ENCOUNTER — Telehealth: Payer: Self-pay | Admitting: Medical

## 2014-07-25 ENCOUNTER — Encounter: Payer: Self-pay | Admitting: Medical

## 2014-07-25 VITALS — BP 102/80 | HR 91 | Temp 98.1°F | Resp 15 | Ht 64.5 in | Wt 206.0 lb

## 2014-07-25 DIAGNOSIS — R002 Palpitations: Secondary | ICD-10-CM

## 2014-07-25 DIAGNOSIS — E669 Obesity, unspecified: Secondary | ICD-10-CM | POA: Diagnosis not present

## 2014-07-25 DIAGNOSIS — R51 Headache: Secondary | ICD-10-CM | POA: Diagnosis not present

## 2014-07-25 DIAGNOSIS — Z720 Tobacco use: Secondary | ICD-10-CM | POA: Diagnosis not present

## 2014-07-25 DIAGNOSIS — R519 Headache, unspecified: Secondary | ICD-10-CM

## 2014-07-25 DIAGNOSIS — Z0000 Encounter for general adult medical examination without abnormal findings: Secondary | ICD-10-CM | POA: Diagnosis not present

## 2014-07-25 DIAGNOSIS — E041 Nontoxic single thyroid nodule: Secondary | ICD-10-CM

## 2014-07-25 DIAGNOSIS — R202 Paresthesia of skin: Secondary | ICD-10-CM | POA: Diagnosis not present

## 2014-07-25 DIAGNOSIS — F17200 Nicotine dependence, unspecified, uncomplicated: Secondary | ICD-10-CM

## 2014-07-25 DIAGNOSIS — E049 Nontoxic goiter, unspecified: Secondary | ICD-10-CM | POA: Diagnosis not present

## 2014-07-25 DIAGNOSIS — Z79899 Other long term (current) drug therapy: Secondary | ICD-10-CM | POA: Diagnosis not present

## 2014-07-25 LAB — POCT URINALYSIS DIPSTICK
BILIRUBIN UA: NEGATIVE
GLUCOSE UA: NEGATIVE
KETONES UA: NEGATIVE
LEUKOCYTES UA: NEGATIVE
Nitrite, UA: NEGATIVE
Protein, UA: NEGATIVE
SPEC GRAV UA: 1.025
Urobilinogen, UA: NEGATIVE
pH, UA: 6

## 2014-07-25 LAB — CBC
HCT: 36.1 % (ref 36.0–46.0)
Hemoglobin: 11.8 g/dL — ABNORMAL LOW (ref 12.0–15.0)
MCH: 27.8 pg (ref 26.0–34.0)
MCHC: 32.7 g/dL (ref 30.0–36.0)
MCV: 85.1 fL (ref 78.0–100.0)
MPV: 9.1 fL (ref 8.6–12.4)
PLATELETS: 354 10*3/uL (ref 150–400)
RBC: 4.24 MIL/uL (ref 3.87–5.11)
RDW: 15.2 % (ref 11.5–15.5)
WBC: 7.6 10*3/uL (ref 4.0–10.5)

## 2014-07-25 MED ORDER — PHENTERMINE HCL 37.5 MG PO TABS: 37.5000 mg | ORAL_TABLET | Freq: Every day | ORAL | Status: DC

## 2014-07-25 MED ORDER — TOPIRAMATE 50 MG PO TABS: 50.0000 mg | ORAL_TABLET | Freq: Two times a day (BID) | ORAL | Status: DC

## 2014-07-25 NOTE — Telephone Encounter (Signed)
Patient is aware of her appointment to have a repeat ultrasound of her neck. 07/29/14 @ 110 pm at [place]

## 2014-07-25 NOTE — Telephone Encounter (Signed)
Set up for repeat thyroid ultrasound for goiter, thyroid nodule

## 2014-07-25 NOTE — Progress Notes (Signed)
Subjective:   HPI  Jacqueline Winters is a 34 y.o. female who presents for a complete physical.  Medical care team includes:  Dr. Iran Planas, endocrinology Dorothea Ogle, PA-C here for primary care    Preventative care: Last ophthalmology visit:N.A Last dental visit:YES DR. REDD Last gynecological exam: 4/ 2015 HERE Last EKG:? Last labs:? Prior vaccinations: TD or Tdap 9/ 2013 Influenza:2015 Pneumococcal:9/ 2013  Concerns: Obesity  - still seeing success on phentermine Topamax combo.  Gets occasional palpitations, occasional mental fog feeling since being on the medication  Gets tingling in toes sometimes, intermittent, worse if seated too long.     Still smoking  Headaches are non existent since being on allergy medication and Topamax.    Reviewed their medical, surgical, family, social, medication, and allergy history and updated chart as appropriate.  Review of Systems Constitutional: -fever, -chills, -sweats, -unexpected weight change, -decreased appetite, -fatigue Allergy: +sneezing, +itching, -congestion Dermatology: -changing moles, --rash, -lumps ENT: -runny nose, -ear pain, -sore throat, -hoarseness, -sinus pain, -teeth pain, - ringing in ears, -hearing loss, -nosebleeds Cardiology: -chest pain, -palpitations, -swelling, -difficulty breathing when lying flat, -waking up short of breath Respiratory: -cough, -shortness of breath, -difficulty breathing with exercise or exertion, -wheezing, -coughing up blood Gastroenterology: -abdominal pain, -nausea, -vomiting, -diarrhea, -constipation, -blood in stool, -changes in bowel movement, -difficulty swallowing or eating Hematology: -bleeding, -bruising  Musculoskeletal: -joint aches, -muscle aches, -joint swelling, -back pain, -neck pain, -cramping, -changes in gait Ophthalmology: denies vision changes, eye redness, itching, discharge Urology: -burning with urination, -difficulty urinating, -blood in urine, -urinary  frequency, -urgency, -incontinence Neurology: -headache, -weakness, -tingling, -numbness, -memory loss, -falls, -dizziness Psychology: -depressed mood, -agitation, -sleep problems     Objective:   Physical Exam  BP 102/80 mmHg  Pulse 91  Temp(Src) 98.1 F (36.7 C) (Oral)  Resp 15  Ht 5' 4.5" (1.638 m)  Wt 206 lb (93.441 kg)  BMI 34.83 kg/m2  LMP 07/21/2014  Wt Readings from Last 3 Encounters:  07/25/14 206 lb (93.441 kg)  02/13/14 223 lb (101.152 kg)  02/03/14 231 lb (104.781 kg)   General appearance: alert, no distress, WD/WN, obese African American female Skin: skin tags of neck, post auricular area on right, left face lateral to orbit, few scattered macules, no worrisome lesions HEENT: normocephalic, conjunctiva/corneas normal, sclerae anicteric, PERRLA, EOMi, nares patent, no discharge or erythema, pharynx normal Oral cavity: MMM, tongue normal, teeth normal Neck: supple, no lymphadenopathy, generalized goiter, with somewhat palpable right nodule, small, no masses, normal ROM, no bruits Chest: non tender, normal shape and expansion Heart: RRR, normal S1, S2, no obvious murmurs Lungs: CTA bilaterally, no wheezes, rhonchi, or rales Abdomen: +bs, soft,  central upper, right upper and inferior to umbilicus port surgical scars, non tender, non distended, no masses, no hepatomegaly, no splenomegaly, no bruits Back: non tender, normal ROM, no scoliosis Musculoskeletal: Bilateral great toes deviate laterally causing overlap with second toes, there are dorsal calluses of bilateral second toe proximal phalanx, otherwise upper extremities non tender, no obvious deformity, normal ROM throughout, lower extremities non tender, no obvious deformity, normal ROM throughout Extremities: no edema, no cyanosis, no clubbing Pulses: 2+ symmetric, upper and lower extremities, normal cap refill Neurological: alert, oriented x 3, CN2-12 intact, strength normal upper extremities and lower extremities,  sensation normal throughout, DTRs 2+ throughout, no cerebellar signs, gait normal Psychiatric: normal affect, a little tearful at times, pleasant  Breast: nontender,fibrocystic tissue, mild, no worrisome masses or lumps, no skin changes, no nipple discharge or  inversion, no axillary lymphadenopathy Gyn/rectal - deferred today, on period   Adult ECG Report  Indication: high risk medication use, palpitations  Rate: 69 bpm  Rhythm: normal sinus rhythm and sinus arrhythmia  QRS Axis: 49 degrees  PR Interval: 169ms  QRS Duration: 2ms  QTc: 463ms  Conduction Disturbances: none  Other Abnormalities: none  Patient's cardiac risk factors are: obesity (BMI >= 30 kg/m2).  EKG comparison: none  Narrative Interpretation: sinus arrhythmia, otherwise normal   Assessment and Plan :    Encounter Diagnoses  Name Primary?  . Encounter for health maintenance examination in adult Yes  . Obesity   . Headache, unspecified headache type   . High risk medication use   . Thyroid nodule   . Goiter   . Smoker   . Palpitations   . Paresthesia of foot, bilateral      Physical exam - discussed healthy lifestyle, diet, exercise, preventative care, vaccinations, and addressed their concerns.  Handout given. See your eye doctor yearly for routine vision care since she has had some recent vision changes, mild See your dentist yearly for routine dental care including hygiene visits twice yearly. Obesity - c/t phentermine Topamax combo, c/t exercise, healthy diet, glad to see she is still seeing improvements.   discussed coming off phentermine in a few months.  Headache - resolved on current medication Thyroid nodule, goiter - will send for updated [place], labs, reviewed 2015 findings Smoker - counseled on cessation Palpitations - EKG reviewed, likely due to hydration status or possible phentermine paresthesia - likely due to weight, positions such as prolonged sitting at times Follow-up pending labs

## 2014-07-26 LAB — TSH: TSH: 0.505 u[IU]/mL (ref 0.350–4.500)

## 2014-07-26 LAB — COMPREHENSIVE METABOLIC PANEL
ALT: 22 U/L (ref 0–35)
AST: 19 U/L (ref 0–37)
Albumin: 4 g/dL (ref 3.5–5.2)
Alkaline Phosphatase: 54 U/L (ref 39–117)
BILIRUBIN TOTAL: 0.2 mg/dL (ref 0.2–1.2)
BUN: 11 mg/dL (ref 6–23)
CALCIUM: 9 mg/dL (ref 8.4–10.5)
CO2: 23 meq/L (ref 19–32)
CREATININE: 0.85 mg/dL (ref 0.50–1.10)
Chloride: 106 mEq/L (ref 96–112)
Glucose, Bld: 92 mg/dL (ref 70–99)
Potassium: 4.2 mEq/L (ref 3.5–5.3)
Sodium: 138 mEq/L (ref 135–145)
Total Protein: 7.1 g/dL (ref 6.0–8.3)

## 2014-07-26 LAB — HEMOGLOBIN A1C
HEMOGLOBIN A1C: 6 % — AB (ref <5.7)
Mean Plasma Glucose: 126 mg/dL — ABNORMAL HIGH (ref <117)

## 2014-07-26 LAB — T4, FREE: FREE T4: 1.01 ng/dL (ref 0.80–1.80)

## 2014-07-29 ENCOUNTER — Ambulatory Visit
Admission: RE | Admit: 2014-07-29 | Discharge: 2014-07-29 | Disposition: A | Discharge status: Home / Self Care | Payer: Commercial Managed Care - PPO | Source: Ambulatory Visit | Attending: Medical | Admitting: Medical

## 2014-07-29 DIAGNOSIS — E049 Nontoxic goiter, unspecified: Secondary | ICD-10-CM

## 2014-07-29 DIAGNOSIS — E041 Nontoxic single thyroid nodule: Secondary | ICD-10-CM

## 2015-02-23 ENCOUNTER — Ambulatory Visit (INDEPENDENT_AMBULATORY_CARE_PROVIDER_SITE_OTHER): Payer: Commercial Managed Care - PPO | Admitting: Medical

## 2015-02-23 ENCOUNTER — Encounter: Payer: Self-pay | Admitting: Medical

## 2015-02-23 VITALS — BP 100/60 | HR 67 | Ht 64.5 in | Wt 196.0 lb

## 2015-02-23 DIAGNOSIS — R7301 Impaired fasting glucose: Secondary | ICD-10-CM | POA: Diagnosis not present

## 2015-02-23 DIAGNOSIS — E049 Nontoxic goiter, unspecified: Secondary | ICD-10-CM | POA: Insufficient documentation

## 2015-02-23 DIAGNOSIS — R109 Unspecified abdominal pain: Secondary | ICD-10-CM

## 2015-02-23 DIAGNOSIS — R519 Headache, unspecified: Secondary | ICD-10-CM

## 2015-02-23 DIAGNOSIS — E669 Obesity, unspecified: Secondary | ICD-10-CM

## 2015-02-23 DIAGNOSIS — R51 Headache: Secondary | ICD-10-CM | POA: Diagnosis not present

## 2015-02-23 DIAGNOSIS — G8929 Other chronic pain: Secondary | ICD-10-CM

## 2015-02-23 LAB — HEMOGLOBIN A1C
Hgb A1c MFr Bld: 5.6 % (ref <5.7)
Mean Plasma Glucose: 114 mg/dL (ref <117)

## 2015-02-23 NOTE — Progress Notes (Addendum)
Subjective: Chief Complaint  Patient presents with  . lower abdominal pain    said it went away but kept appt to talk about weight loss. seen ob for the pain, said that they said it could be a pulled muscle. she doesnt think that is correct said the pain is "deep." said she stopped taking fentermine because of how it made her feel, wants to discuss protein shakes effecting her a1c.wants it checked   Originally set the appt for lower abdominal pain, had called gynecology about this.  They advised to watch and wait and the pain finally resolved.  So not actually here to discuss weight loss which was the original intent of the visit.    Here to discuss weight loss efforts and get updated labs.  Last discussed this in 07/2014 and at that time was using phentermine + Topamax as she has some chronic headaches as well.  Currently exercising 3 days per week zumba class.  Likes doing classes with people. Doing protein shakes 1-2 daily, smaller meals, eating smaller more frequent meals, drinking lots of water.   Still feels she is moving in the right direction.  Has lost 40lb in the last 12 months with these efforts.  Had tried phentermine last visit ,but didn't like the way it made her feel.   Made her feel jittery.  Tried ever 1/2 tablet phentermine daily . Feels like the Topamax helps more with weight loss and headache history. No other aggravating or relieving factors. No other complaint.  Past Medical History  Diagnosis Date  . Tobacco use disorder   . PONV (postoperative nausea and vomiting)   . GERD (gastroesophageal reflux disease)     with pregnancy  . Headache(784.0)   . Insomnia   . Allergy   . Obesity   . Goiter   . Thyroid nodule     biopsy 07/2013, consult with Dr. Iran Planas endocrinology    ROS as in subjective   Objective: BP 100/60 mmHg  Pulse 67  Ht 5' 4.5" (1.638 m)  Wt 196 lb (88.905 kg)  BMI 33.14 kg/m2  SpO2 99%  LMP 02/08/2015  Gen:wd, wn, nad Neck: mild  generalized goiter, no distinct nodule, no mass, no lymphadenopathy Otherwise not examined    Assessment: Encounter Diagnoses  Name Primary?  . Abdominal pain, unspecified abdominal location Yes  . Obesity   . Chronic nonintractable headache, unspecified headache type   . Thyroid goiter   . Impaired fasting blood sugar     Plan: Abdominal pain - resolved Obesity, impaired fasting glucose - discussed her efforts so far.  She can check insurance coverage for current weight loss medications on the market as discussed.  Glad to see she is still working towards her goals . discussed diet, exercise, water intake, goal setting.   HgbA1C lab today Chronic headaches - doing fine on Topamax 50mg  QHS daily Thyroid goiter- c/t to monitor yearly Impaired glucose - labs today

## 2015-02-23 NOTE — Patient Instructions (Signed)
Check on the insurance coverage for the following medications:  Belviq  Contrave  Qsymia  Saxenda  Recommendations:  Add weight training/circuit training or home calisthenics twice weekly to your regimen  Continue Zumba but consider doing a long walk with the family 2-3 days per week if possible to get different intensity exercise  Continue healthy diet  Try and find a way to cut out 500 calories per day in the diet.

## 2015-03-09 ENCOUNTER — Ambulatory Visit (INDEPENDENT_AMBULATORY_CARE_PROVIDER_SITE_OTHER): Payer: Commercial Managed Care - PPO | Admitting: Medical

## 2015-03-09 ENCOUNTER — Encounter: Payer: Self-pay | Admitting: Medical

## 2015-03-09 VITALS — BP 100/60 | HR 60 | Temp 98.7°F | Wt 197.0 lb

## 2015-03-09 DIAGNOSIS — J988 Other specified respiratory disorders: Secondary | ICD-10-CM | POA: Diagnosis not present

## 2015-03-09 MED ORDER — AZITHROMYCIN 250 MG PO TABS: ORAL_TABLET | ORAL | Status: DC

## 2015-03-09 NOTE — Patient Instructions (Signed)
Thank you for giving me the opportunity to serve you today.   Your diagnosis today includes: Encounter Diagnosis  Name Primary?  Marland Kitchen Respiratory tract infection Yes    Medications prescribed today:   Specific home care recommendations today include:  Only take over-the-counter (OTC) or prescription medicines for pain, discomfort, or fever as directed by your caregiver.    Decongestant: You may use OTC Guaifenesin (Mucinex plain) for congestion.  You may use Pseudoephedrine (Sudafed) only if you don't have blood pressure problems or a diagnosis of hypertension.  Cough suppression: If you have cough from drainage, you may use over-the-counter Dextromethorphan (Delsym) as directed on the label  Sore throat remedies:  You may use salt water gargles, warm fluids such as coffee or hot tea, or honey/tea/lemon mixture to sooth sore throat pain.  You may use OTC sore throat remedies such as Cepacol lozenges or Chloraseptic spray for sore throat pain.  Runny nose and sneezing remedies: You may use OTC antihistamine such as Zyrtec or Benadryl, but caution as these can cause drowsiness.    Pain/fever relief: You may use over-the-counter Tylenol for pain or fever  Drink extra fluids. Fluids help thin the mucus so your sinuses can drain more easily.   Applying either moist heat or ice packs to the sinus areas may help relieve discomfort.  Use saline nasal sprays to help moisten your sinuses. The sprays can be found at your local drugstore.    Please call or return if worse or not improving in the next few days.    Medication costs:  If you get to the pharmacy and medication prescribed today was either too expensive, not covered by your insurance, or required prior authorization, then please call us back to let us know.  We often have no way to know if a medication is too expensive or not covered by your insurance.  Thanks for your cooperation.   Return if symptoms worsen or fail to  improve.    I have included other useful information below for your review.   Upper Respiratory Infection, Adult An upper respiratory infection (URI) is also known as the common cold. It is often caused by a type of germ (virus). Colds are easily spread (contagious). You can pass it to others by kissing, coughing, sneezing, or drinking out of the same glass. Usually, you get better in 1 or 2 weeks.  HOME CARE   Only take medicine as told by your doctor.   Use a warm mist humidifier or breathe in steam from a hot shower.   Drink enough water and fluids to keep your pee (urine) clear or pale yellow.   Get plenty of rest.   Return to work when your temperature is back to normal or as told by your doctor. You may use a face mask and wash your hands to stop your cold from spreading.  GET HELP RIGHT AWAY IF:   After the first few days, you feel you are getting worse.   You have questions about your medicine.   You have chills, shortness of breath, or brown or red spit (mucus).   You have yellow or brown snot (nasal discharge) or pain in the face, especially when you bend forward.   You have a fever, puffy (swollen) neck, pain when you swallow, or white spots in the back of your throat.   You have a bad headache, ear pain, sinus pain, or chest pain.   You have a high-pitched whistling sound when  you breathe in and out (wheezing).   You have a lasting cough or cough up blood.   You have sore muscles or a stiff neck.  MAKE SURE YOU:   Understand these instructions.   Will watch your condition.   Will get help right away if you are not doing well or get worse.  Document Released: 09/14/2007 Document Revised: 12/08/2010 Document Reviewed: 08/02/2010 [facility] Patient Information 2012 [place].

## 2015-03-09 NOTE — Progress Notes (Signed)
Subjective:  Jacqueline Winters is a 34 y.o. female who presents for respiratory illness.  Just went on Cruise vacation to [place], [place], and [place].   Started getting cough and congestion about 5-6 days ago.  Now has productive cough, yellow green nasal drainage, nasal congestion, head pressure, starting to settle in chest.  Using Dramamine and ibuprofen for symptoms.  vomited once but attributed this to motion sickness.   Denies other specific sick contacts.  Has felt a little fever and chills, subjective.  No nausea, vomiting, wheezing, SOB.  No other aggravating or relieving factors. No other complaint.  Past Medical History  Diagnosis Date  . Tobacco use disorder   . PONV (postoperative nausea and vomiting)   . GERD (gastroesophageal reflux disease)     with pregnancy  . Headache(784.0)   . Insomnia   . Allergy   . Obesity   . Goiter   . Thyroid nodule     biopsy 07/2013, consult with Dr. Iran Planas endocrinology    ROS as in subjective   Objective: BP 102/84 mmHg  Pulse 60  Wt 197 lb (89.359 kg)  LMP 03/01/2015  General appearance: Alert, WD/WN, no distress, mildly ill appearing                             Skin: warm, no rash                           Head: no sinus tenderness                            Eyes: conjunctiva normal, corneas clear, PERRLA                            Ears: pearly TMs, external ear canals normal                          Nose: septum midline, turbinates swollen, with erythema and clear discharge             Mouth/throat: MMM, tongue normal, mild pharyngeal erythema                           Neck: supple, no adenopathy, no thyromegaly, non tender                          Heart: RRR, normal S1, S2, no murmurs                         Lungs: CTA bilaterally, no wheezes, rales, or rhonchi     Assessment  Encounter Diagnosis  Name Primary?  Marland Kitchen Respiratory tract infection Yes      Plan: Medications prescribed today: zpak watch and wait  script. discussed symptoms, exam, suggesting viral URI.  However, she is prone to getting sinus infection prior.   Discussed using supportive care and watch and wait approach.  If much worse in the next 3-5 days despite recommendations today, then can begin [place].     Specific home care recommendations today include:  Only take over-the-counter (OTC) or prescription medicines for pain, discomfort, or fever as directed by your caregiver.    Decongestant: You may use OTC  Guaifenesin (Mucinex plain) for congestion.  You may use Pseudoephedrine (Sudafed) only if you don't have blood pressure problems or a diagnosis of hypertension.  Cough suppression: If you have cough from drainage, you may use over-the-counter Dextromethorphan (Delsym) as directed on the label  Sore throat remedies:  You may use salt water gargles, warm fluids such as coffee or hot tea, or honey/tea/lemon mixture to sooth sore throat pain.  You may use OTC sore throat remedies such as Cepacol lozenges or Chloraseptic spray for sore throat pain.  Runny nose and sneezing remedies: You may use OTC antihistamine such as Zyrtec or Benadryl, but caution as these can cause drowsiness.    Pain/fever relief: You may use over-the-counter Tylenol for pain or fever  Drink extra fluids. Fluids help thin the mucus so your sinuses can drain more easily.   Applying either moist heat or ice packs to the sinus areas may help relieve discomfort.  Use saline nasal sprays to help moisten your sinuses. The sprays can be found at your local drugstore.   Patient was advised to call or return if worse or not improving in the next few days.    Patient voiced understanding of diagnosis, recommendations, and treatment plan.  After visit summary given.

## 2015-03-17 ENCOUNTER — Telehealth: Payer: Self-pay | Admitting: Medical

## 2015-03-17 NOTE — Telephone Encounter (Signed)
Pt emailed a form that needs to be completed. I am sending back in your folder.

## 2015-03-17 NOTE — Telephone Encounter (Signed)
Pt needs asap.

## 2015-03-18 NOTE — Telephone Encounter (Signed)
Done, see paper

## 2015-07-07 ENCOUNTER — Other Ambulatory Visit (INDEPENDENT_AMBULATORY_CARE_PROVIDER_SITE_OTHER): Payer: 59

## 2015-07-07 DIAGNOSIS — Z111 Encounter for screening for respiratory tuberculosis: Secondary | ICD-10-CM

## 2015-07-30 ENCOUNTER — Telehealth: Payer: Self-pay | Admitting: *Deleted

## 2015-07-30 ENCOUNTER — Ambulatory Visit (INDEPENDENT_AMBULATORY_CARE_PROVIDER_SITE_OTHER): Payer: 59 | Admitting: Medical

## 2015-07-30 ENCOUNTER — Encounter: Payer: Self-pay | Admitting: Medical

## 2015-07-30 VITALS — BP 100/60 | HR 65 | Ht 63.5 in | Wt 191.0 lb

## 2015-07-30 DIAGNOSIS — R51 Headache: Secondary | ICD-10-CM

## 2015-07-30 DIAGNOSIS — J309 Allergic rhinitis, unspecified: Secondary | ICD-10-CM

## 2015-07-30 DIAGNOSIS — Z862 Personal history of diseases of the blood and blood-forming organs and certain disorders involving the immune mechanism: Secondary | ICD-10-CM | POA: Diagnosis not present

## 2015-07-30 DIAGNOSIS — Z1159 Encounter for screening for other viral diseases: Secondary | ICD-10-CM | POA: Diagnosis not present

## 2015-07-30 DIAGNOSIS — Z0000 Encounter for general adult medical examination without abnormal findings: Secondary | ICD-10-CM | POA: Diagnosis not present

## 2015-07-30 DIAGNOSIS — E049 Nontoxic goiter, unspecified: Secondary | ICD-10-CM | POA: Diagnosis not present

## 2015-07-30 DIAGNOSIS — G4700 Insomnia, unspecified: Secondary | ICD-10-CM | POA: Diagnosis not present

## 2015-07-30 DIAGNOSIS — E669 Obesity, unspecified: Secondary | ICD-10-CM | POA: Diagnosis not present

## 2015-07-30 DIAGNOSIS — R7301 Impaired fasting glucose: Secondary | ICD-10-CM

## 2015-07-30 DIAGNOSIS — G8929 Other chronic pain: Secondary | ICD-10-CM

## 2015-07-30 DIAGNOSIS — F17200 Nicotine dependence, unspecified, uncomplicated: Secondary | ICD-10-CM | POA: Diagnosis not present

## 2015-07-30 DIAGNOSIS — L732 Hidradenitis suppurativa: Secondary | ICD-10-CM | POA: Diagnosis not present

## 2015-07-30 DIAGNOSIS — Z131 Encounter for screening for diabetes mellitus: Secondary | ICD-10-CM | POA: Insufficient documentation

## 2015-07-30 DIAGNOSIS — R519 Headache, unspecified: Secondary | ICD-10-CM

## 2015-07-30 LAB — POCT URINALYSIS DIPSTICK
BILIRUBIN UA: NEGATIVE
GLUCOSE UA: NEGATIVE
KETONES UA: NEGATIVE
Leukocytes, UA: NEGATIVE
NITRITE UA: NEGATIVE
Protein, UA: NEGATIVE
SPEC GRAV UA: 1.025
Urobilinogen, UA: NEGATIVE
pH, UA: 6

## 2015-07-30 LAB — CBC
HEMATOCRIT: 37.8 % (ref 35.0–45.0)
HEMOGLOBIN: 12 g/dL (ref 11.7–15.5)
MCH: 28.1 pg (ref 27.0–33.0)
MCHC: 31.7 g/dL — AB (ref 32.0–36.0)
MCV: 88.5 fL (ref 80.0–100.0)
MPV: 10.2 fL (ref 7.5–12.5)
Platelets: 309 10*3/uL (ref 140–400)
RBC: 4.27 MIL/uL (ref 3.80–5.10)
RDW: 15 % (ref 11.0–15.0)
WBC: 8.7 10*3/uL (ref 4.0–10.5)

## 2015-07-30 LAB — LIPID PANEL
CHOL/HDL RATIO: 2.1 ratio (ref <=5.0)
Cholesterol: 126 mg/dL (ref 125–200)
HDL: 59 mg/dL (ref >=46)
LDL Cholesterol: 59 mg/dL (ref <130)
TRIGLYCERIDES: 39 mg/dL (ref <150)
VLDL: 8 mg/dL (ref <30)

## 2015-07-30 LAB — COMPREHENSIVE METABOLIC PANEL
ALT: 18 U/L (ref 6–29)
AST: 17 U/L (ref 10–30)
Albumin: 4 g/dL (ref 3.6–5.1)
Alkaline Phosphatase: 47 U/L (ref 33–115)
BUN: 11 mg/dL (ref 7–25)
CHLORIDE: 105 mmol/L (ref 98–110)
CO2: 25 mmol/L (ref 20–31)
CREATININE: 0.86 mg/dL (ref 0.50–1.10)
Calcium: 9.1 mg/dL (ref 8.6–10.2)
GLUCOSE: 93 mg/dL (ref 65–99)
Potassium: 4.4 mmol/L (ref 3.5–5.3)
SODIUM: 138 mmol/L (ref 135–146)
TOTAL PROTEIN: 7.1 g/dL (ref 6.1–8.1)
Total Bilirubin: 0.3 mg/dL (ref 0.2–1.2)

## 2015-07-30 LAB — TSH: TSH: 0.62 mIU/L

## 2015-07-30 LAB — HEMOGLOBIN A1C
Hgb A1c MFr Bld: 5.6 % (ref <5.7)
Mean Plasma Glucose: 114 mg/dL

## 2015-07-30 LAB — T4, FREE: FREE T4: 1.1 ng/dL (ref 0.8–1.8)

## 2015-07-30 MED ORDER — MUPIROCIN 2 % EX OINT: 1.0000 "application " | TOPICAL_OINTMENT | Freq: Two times a day (BID) | CUTANEOUS | Status: DC

## 2015-07-30 MED ORDER — DOXYCYCLINE HYCLATE 100 MG PO TABS: 100.0000 mg | ORAL_TABLET | Freq: Two times a day (BID) | ORAL | Status: DC

## 2015-07-30 NOTE — Progress Notes (Signed)
Subjective:   HPI  Jacqueline Winters is a 35 y.o. female who presents for a complete physical.  Medical care team includes:  Dr. Iran Planas, endocrinology, in remote past Dorothea Ogle, PA-C here for primary care    Concerns: Has hx/o boils in armpit on right for 18 + years, and lately been getting these regularly.  Had some drainage from one last night.   Otherwise, no big issues for now.  Her and family are planning to move to [place], [place].  She and husband both are working there.  Reviewed their medical, surgical, family, social, medication, and allergy history and updated chart as appropriate.  Review of Systems Constitutional: -fever, -chills, -sweats, -unexpected weight change, -decreased appetite, -fatigue Allergy: -sneezing, -itching, -congestion Dermatology: -changing moles, --rash ENT: -runny nose, -ear pain, -sore throat, -hoarseness, -sinus pain, -teeth pain, - ringing in ears, -hearing loss, -nosebleeds Cardiology: -chest pain, -palpitations, -swelling, -difficulty breathing when lying flat, -waking up short of breath Respiratory: -cough, -shortness of breath, -difficulty breathing with exercise or exertion, -wheezing, -coughing up blood Gastroenterology: -abdominal pain, -nausea, -vomiting, -diarrhea, -constipation, -blood in stool, -changes in bowel movement, -difficulty swallowing or eating Hematology: -bleeding, -bruising  Musculoskeletal: -joint aches, -muscle aches, -joint swelling, -back pain, -neck pain, -cramping, -changes in gait Ophthalmology: denies vision changes, eye redness, itching, discharge Urology: -burning with urination, -difficulty urinating, -blood in urine, -urinary frequency, -urgency, -incontinence Neurology: -headache, -weakness, -tingling, -numbness, -memory loss, -falls, -dizziness Psychology: -depressed mood, -agitation, -sleep problems     Past Medical History  Diagnosis Date  . Tobacco use disorder   . PONV (postoperative nausea and  vomiting)   . GERD (gastroesophageal reflux disease)     with pregnancy  . Headache(784.0)   . Insomnia   . Allergy   . Obesity   . Goiter   . Thyroid nodule     biopsy 07/2013, consult with Dr. Iran Planas endocrinology    Past Surgical History  Procedure Laterality Date  . Vaginal delivery  2002, 1998  . Cholecystectomy  2009  . Cesarean section  12/21/2011    Procedure: CESAREAN SECTION;  Surgeon: Farrel Gobble. Harrington Challenger, MD;  Location: [facility];  Service: Obstetrics;  Laterality: N/A;  Bilateral Tubal Ligation  . Tubal ligation    . Pilonidal cyst / sinus excision  2008    [facility]  . Incision and drainage      right axillary boils    Social History   Social History  . Marital Status: Married    Spouse Name: N/A  . Number of Children: N/A  . Years of Education: N/A   Occupational History  . Not on file.   Social History Main Topics  . Smoking status: Current Some Day Smoker -- 0.30 packs/day for 12 years    Types: Cigarettes  . Smokeless tobacco: Never Used  . Alcohol Use: 0.6 oz/week    0 Standard drinks or equivalent, 1 Shots of liquor per week  . Drug Use: No  . Sexual Activity: Not on file   Other Topics Concern  . Not on file   Social History Narrative   Married, 3 children ages 14 years old, 52 years old,  35 yo, Christian, exercises - zumba, fit camp with [employer], is a Marine scientist, travel nurse, currently [employer].  Prior at [employer].  As of 07/2015    Family History  Problem Relation Age of Onset  . Hypertension Mother   . Diabetes Mother   .  Hypertension Sister   . Cancer Maternal Grandmother     colon, ovarian  . Hypertension Sister   . Cancer Maternal Aunt     breast  . Heart disease Neg Hx   . Stroke Neg Hx      Current outpatient prescriptions:  .  ibuprofen (ADVIL,MOTRIN) 200 MG tablet, Take 400 mg by mouth every 6 (six) hours as needed. Reported on 07/30/2015, Disp: , Rfl:  .  Multiple Vitamin  (MULTIVITAMIN) tablet, Take 1 tablet by mouth daily., Disp: , Rfl:  .  vitamin B-12 (CYANOCOBALAMIN) 1000 MCG tablet, Take 1 tablet (1,000 mcg total) by mouth daily., Disp: 30 tablet, Rfl: 3 .  acetaminophen (TYLENOL) 500 MG tablet, Take 1,000 mg by mouth every 6 (six) hours as needed. Reported on 07/30/2015, Disp: , Rfl:  .  cetirizine (ZYRTEC) 10 MG tablet, Take 10 mg by mouth daily. Reported on 07/30/2015, Disp: , Rfl:  .  Coconut Oil OIL, by Does not apply route. Reported on 07/30/2015, Disp: , Rfl:  .  doxycycline (VIBRA-TABS) 100 MG tablet, Take 1 tablet (100 mg total) by mouth 2 (two) times daily., Disp: 20 tablet, Rfl: 0 .  mupirocin ointment (BACTROBAN) 2 %, Place 1 application into the nose 2 (two) times daily., Disp: 30 g, Rfl: 2  Allergies  Allergen Reactions  . Vicodin [Hydrocodone-Acetaminophen]     uneasy      Objective:   Physical Exam  BP 100/60 mmHg  Pulse 65  Ht 5' 3.5" (1.613 m)  Wt 191 lb (86.637 kg)  BMI 33.30 kg/m2  LMP 07/30/2015  Wt Readings from Last 3 Encounters:  07/30/15 191 lb (86.637 kg)  03/09/15 197 lb (89.359 kg)  02/23/15 196 lb (88.905 kg)   General appearance: alert, no distress, WD/WN, obese African American female Skin: skin tags of neck, post auricular area on right, left face lateral to orbit, few scattered macules, right axilla with nodular scarring and 1 active tender nodule with no erythema or pus drainage, but recent opening and signs of recent drainage HEENT: normocephalic, conjunctiva/corneas normal, sclerae anicteric, PERRLA, EOMi, nares patent, no discharge or erythema, pharynx normal Oral cavity: MMM, tongue normal, teeth normal Neck: supple, no lymphadenopathy, generalized goiter, with somewhat palpable right nodule, small, no masses, normal ROM, no bruits Chest: non tender, normal shape and expansion Heart: RRR, normal S1, S2, no obvious murmurs Lungs: CTA bilaterally, no wheezes, rhonchi, or rales Abdomen: +bs, soft,  central  upper, right upper and inferior to umbilicus port surgical scars, non tender, non distended, no masses, no hepatomegaly, no splenomegaly, no bruits Back: non tender, normal ROM, no scoliosis Musculoskeletal: Bilateral great toes deviate laterally causing overlap with second toes, there are dorsal calluses of bilateral second toe proximal phalanx, otherwise upper extremities non tender, no obvious deformity, normal ROM throughout, lower extremities non tender, no obvious deformity, normal ROM throughout Extremities: no edema, no cyanosis, no clubbing Pulses: 2+ symmetric, upper and lower extremities, normal cap refill Neurological: alert, oriented x 3, CN2-12 intact, strength normal upper extremities and lower extremities, sensation normal throughout, DTRs 2+ throughout, no cerebellar signs, gait normal Psychiatric: normal affect, pleasant  Breast: deferred at her request today Gyn/rectal - deferred today, on period   Assessment and Plan :    Encounter Diagnoses  Name Primary?  . Encounter for health maintenance examination in adult Yes  . Obesity   . Thyroid goiter   . Impaired fasting blood sugar   . Chronic nonintractable headache, unspecified headache type   .  Tobacco use disorder   . Insomnia   . Allergic rhinitis, unspecified allergic rhinitis type   . History of anemia   . Hidradenitis suppurativa      Physical exam - discussed healthy lifestyle, diet, exercise, preventative care, vaccinations, and addressed their concerns.  Handout given. See your eye doctor yearly for routine vision care since she has had some recent vision changes, mild See your dentist yearly for routine dental care including hygiene visits twice yearly. Migraines - none of late.   She stopped Topamax on her own within the last year Thyroid nodule, goiter - reviewed 2016 repeat ultrasound. Stable, will check periodically.   Smoker - counseled on cessation Recurrent boils - begin 10 days of doxycycline and  then topical mupirocin daily for 2 months then recheck , sooner prn Return at her convenience for breast/pelvic exam Will complete work forms pending lab titer. Follow-up pending labs  Jacqueline Winters was seen today for annual exam.  Diagnoses and all orders for this visit:  Encounter for health maintenance examination in adult -     Comprehensive metabolic panel -     CBC -     TSH -     Hemoglobin A1c -     Lipid panel -     T4, free -     Visual acuity screening -     POCT urinalysis dipstick -     Hepatitis B surface antibody  Obesity  Thyroid goiter -     TSH -     T4, free  Impaired fasting blood sugar -     Hemoglobin A1c  Chronic nonintractable headache, unspecified headache type  Tobacco use disorder  Insomnia  Allergic rhinitis, unspecified allergic rhinitis type  History of anemia  Hidradenitis suppurativa  Need for hepatitis B screening test -     Hepatitis B surface antibody  Other orders -     doxycycline (VIBRA-TABS) 100 MG tablet; Take 1 tablet (100 mg total) by mouth 2 (two) times daily. -     mupirocin ointment (BACTROBAN) 2 %; Place 1 application into the nose 2 (two) times daily.

## 2015-07-30 NOTE — Telephone Encounter (Signed)
Error

## 2015-07-31 LAB — HEPATITIS B SURFACE ANTIBODY, QUANTITATIVE: Hepatitis B-Post: 7.6 m[IU]/mL

## 2016-01-07 ENCOUNTER — Telehealth: Payer: Self-pay | Admitting: Medical

## 2016-01-07 ENCOUNTER — Encounter: Payer: Self-pay | Admitting: Medical

## 2016-01-07 ENCOUNTER — Ambulatory Visit (INDEPENDENT_AMBULATORY_CARE_PROVIDER_SITE_OTHER): Payer: 59 | Admitting: Medical

## 2016-01-07 VITALS — BP 112/68 | HR 70 | Temp 98.4°F | Ht 63.5 in | Wt 191.2 lb

## 2016-01-07 DIAGNOSIS — L732 Hidradenitis suppurativa: Secondary | ICD-10-CM

## 2016-01-07 DIAGNOSIS — R7301 Impaired fasting glucose: Secondary | ICD-10-CM

## 2016-01-07 LAB — POCT GLYCOSYLATED HEMOGLOBIN (HGB A1C): Hemoglobin A1C: 5.7

## 2016-01-07 MED ORDER — CEPHALEXIN 500 MG PO CAPS: 500.0000 mg | ORAL_CAPSULE | Freq: Three times a day (TID) | ORAL | 1 refills | Status: DC

## 2016-01-07 NOTE — Telephone Encounter (Signed)
Refer to dermatology for recurrent hidradenitis suppurative axillaris.  In the meantime have her begin round of Keflex antibiotic I sent to pharmacy

## 2016-01-07 NOTE — Progress Notes (Signed)
Subjective: Chief Complaint  Patient presents with  . Recurrent Skin Infections    x2, R arm, had N&V this weekend,  bursts and irritated, tender when touched, Bactrim and Doxycycline does not seem to alleviate   Here for recurrent boils/hidradenitis in axillary regions.  Last visit 07/2015 we had her use topical mupirocin which she still does and she completed doxycycline oral x 10 days.   Still has persistent scar and draining lesion right axilla, and recent new lesion right anterior axilla.    She wants to repeat her diabetes marker today for imparled glucose.   Wants other option for the hidradenitis.  No other aggravating or relieving factors. No other complaint.  Past Medical History:  Diagnosis Date  . Allergy   . GERD (gastroesophageal reflux disease)    with pregnancy  . Goiter   . Headache(784.0)   . Insomnia   . Obesity   . PONV (postoperative nausea and vomiting)   . Thyroid nodule    biopsy 07/2013, consult with Dr. Iran Planas endocrinology  . Tobacco use disorder    ROS as in subjective   Objective: BP 112/68 (BP Location: Left Arm, Patient Position: Sitting, Cuff Size: Large)   Pulse 70   Temp 98.4 F (36.9 C) (Oral)   Ht 5' 3.5" (1.613 m)   Wt 191 lb 3.2 oz (86.7 kg)   SpO2 99%   BMI 33.34 kg/m   Gen: wd, wn, nad Skin: right axilla inferiorly with scarring from prior lesions, and there is a small 20mm drainage hole for chronic hidradenitis, pink raised slight tender lesion from recent flare.  Scars in left axilla from prior hidradenitis. Neck: palpable mild goiter, no distinct nodule, no lymphadenopathy, no mass No other lymphadenopathy   Assessment: Encounter Diagnoses  Name Primary?  . Hidradenitis suppurativa Yes  . Impaired fasting blood sugar       Plan: Begin antibiotic, refer to dermatology, can c/t ongoing topical mupirocin until she sees dermatology  Impaired glucose - HgbA1C 5.7 % today.  Toiya was seen today for recurrent skin  infections.  Diagnoses and all orders for this visit:  Hidradenitis suppurativa  Impaired fasting blood sugar -     POCT glycosylated hemoglobin (Hb A1C)

## 2016-01-08 ENCOUNTER — Other Ambulatory Visit: Payer: Self-pay

## 2016-01-08 NOTE — Telephone Encounter (Signed)
Faxed referral to [facility]  And informed pt

## 2016-01-13 ENCOUNTER — Encounter: Payer: Self-pay | Admitting: Internal Medicine

## 2016-02-01 ENCOUNTER — Telehealth: Payer: Self-pay | Admitting: Medical

## 2016-02-01 NOTE — Telephone Encounter (Signed)
Pt called and stated that she now has a yeast infection due to the antibiotics she was on for boil. She is requesting something be called in for her. Pt uses [facility] on [address]. Pt can be reached at [phone].  ALSO pt is requesting a refill on her Topamax

## 2016-02-02 ENCOUNTER — Other Ambulatory Visit: Payer: Self-pay | Admitting: Medical

## 2016-02-02 MED ORDER — FLUCONAZOLE 150 MG PO TABS: ORAL_TABLET | ORAL | 0 refills | Status: DC

## 2016-02-02 NOTE — Telephone Encounter (Signed)
Informed pt that rx was sent in but also wants to know if you will send her TOPAMAX in states that she was on it before but is having to take a lot of aleve for headaches and is wanting to go back on this medicine, please advise

## 2016-02-02 NOTE — Telephone Encounter (Signed)
I sent diflucan, f/u if not resolving.

## 2016-02-02 NOTE — Telephone Encounter (Signed)
Pt called back and was wanting to know if you could send her something, please refer to the message

## 2016-02-03 ENCOUNTER — Other Ambulatory Visit: Payer: Self-pay | Admitting: Medical

## 2016-02-03 MED ORDER — TOPIRAMATE 25 MG PO TABS: 25.0000 mg | ORAL_TABLET | Freq: Every day | ORAL | 0 refills | Status: DC

## 2016-02-03 NOTE — Telephone Encounter (Signed)
Topamax sent.  I reviewed chart hx/o.  Make her f/u visit within a month on headaches/hx/o migraines.

## 2016-02-03 NOTE — Telephone Encounter (Signed)
Pt notified of recommendations and appt scheduled for Nov 20th. Jacqueline Winters

## 2016-02-29 ENCOUNTER — Ambulatory Visit: Payer: 59 | Admitting: Medical

## 2016-03-02 ENCOUNTER — Encounter: Payer: Self-pay | Admitting: Medical

## 2016-04-13 ENCOUNTER — Telehealth: Payer: Self-pay | Admitting: Medical

## 2016-04-13 NOTE — Telephone Encounter (Signed)
Form ready to scan and return

## 2016-04-13 NOTE — Telephone Encounter (Signed)
Received email from pt. She is requesting forms be signed. These are for exercise program. Sending back in folder.

## 2016-04-14 NOTE — Telephone Encounter (Signed)
Pt aware paper ready for pick up. Jacqueline Winters

## 2016-08-12 DIAGNOSIS — J019 Acute sinusitis, unspecified: Secondary | ICD-10-CM | POA: Diagnosis not present

## 2016-08-17 ENCOUNTER — Ambulatory Visit (INDEPENDENT_AMBULATORY_CARE_PROVIDER_SITE_OTHER): Payer: 59 | Admitting: Medical

## 2016-08-17 ENCOUNTER — Encounter: Payer: Self-pay | Admitting: Medical

## 2016-08-17 VITALS — BP 118/78 | HR 76 | Ht 64.0 in | Wt 194.2 lb

## 2016-08-17 DIAGNOSIS — F17200 Nicotine dependence, unspecified, uncomplicated: Secondary | ICD-10-CM

## 2016-08-17 DIAGNOSIS — R7301 Impaired fasting glucose: Secondary | ICD-10-CM

## 2016-08-17 DIAGNOSIS — J301 Allergic rhinitis due to pollen: Secondary | ICD-10-CM | POA: Diagnosis not present

## 2016-08-17 DIAGNOSIS — L732 Hidradenitis suppurativa: Secondary | ICD-10-CM

## 2016-08-17 DIAGNOSIS — J329 Chronic sinusitis, unspecified: Secondary | ICD-10-CM | POA: Diagnosis not present

## 2016-08-17 DIAGNOSIS — G4700 Insomnia, unspecified: Secondary | ICD-10-CM | POA: Diagnosis not present

## 2016-08-17 DIAGNOSIS — Z6833 Body mass index (BMI) 33.0-33.9, adult: Secondary | ICD-10-CM | POA: Diagnosis not present

## 2016-08-17 DIAGNOSIS — E049 Nontoxic goiter, unspecified: Secondary | ICD-10-CM | POA: Diagnosis not present

## 2016-08-17 DIAGNOSIS — Z7185 Encounter for immunization safety counseling: Secondary | ICD-10-CM | POA: Insufficient documentation

## 2016-08-17 DIAGNOSIS — Z0000 Encounter for general adult medical examination without abnormal findings: Secondary | ICD-10-CM | POA: Diagnosis not present

## 2016-08-17 DIAGNOSIS — Z862 Personal history of diseases of the blood and blood-forming organs and certain disorders involving the immune mechanism: Secondary | ICD-10-CM

## 2016-08-17 DIAGNOSIS — E669 Obesity, unspecified: Secondary | ICD-10-CM | POA: Diagnosis not present

## 2016-08-17 DIAGNOSIS — Z7189 Other specified counseling: Secondary | ICD-10-CM | POA: Diagnosis not present

## 2016-08-17 LAB — CBC
HEMATOCRIT: 38.6 % (ref 35.0–45.0)
HEMOGLOBIN: 12.6 g/dL (ref 11.7–15.5)
MCH: 28.6 pg (ref 27.0–33.0)
MCHC: 32.6 g/dL (ref 32.0–36.0)
MCV: 87.5 fL (ref 80.0–100.0)
MPV: 9.7 fL (ref 7.5–12.5)
Platelets: 305 10*3/uL (ref 140–400)
RBC: 4.41 MIL/uL (ref 3.80–5.10)
RDW: 14.1 % (ref 11.0–15.0)
WBC: 5.8 10*3/uL (ref 4.0–10.5)

## 2016-08-17 LAB — COMPREHENSIVE METABOLIC PANEL
ALT: 15 U/L (ref 6–29)
AST: 18 U/L (ref 10–30)
Albumin: 4.1 g/dL (ref 3.6–5.1)
Alkaline Phosphatase: 46 U/L (ref 33–115)
BUN: 11 mg/dL (ref 7–25)
CALCIUM: 9.2 mg/dL (ref 8.6–10.2)
CO2: 25 mmol/L (ref 20–31)
Chloride: 103 mmol/L (ref 98–110)
Creat: 0.82 mg/dL (ref 0.50–1.10)
GLUCOSE: 94 mg/dL (ref 65–99)
POTASSIUM: 4.1 mmol/L (ref 3.5–5.3)
Sodium: 139 mmol/L (ref 135–146)
Total Bilirubin: 0.4 mg/dL (ref 0.2–1.2)
Total Protein: 7.4 g/dL (ref 6.1–8.1)

## 2016-08-17 LAB — LIPID PANEL
Cholesterol: 142 mg/dL (ref <200)
HDL: 61 mg/dL (ref >50)
LDL CALC: 71 mg/dL (ref <100)
Total CHOL/HDL Ratio: 2.3 Ratio (ref <5.0)
Triglycerides: 51 mg/dL (ref <150)
VLDL: 10 mg/dL (ref <30)

## 2016-08-17 LAB — T4, FREE: Free T4: 1.2 ng/dL (ref 0.8–1.8)

## 2016-08-17 LAB — TSH: TSH: 0.52 m[IU]/L

## 2016-08-17 NOTE — Progress Notes (Signed)
Subjective:   HPI  Jacqueline Winters is a 36 y.o. female who presents for a complete physical.  Medical care team includes:  Dr. Iran Planas, endocrinology, in remote past Dorothea Ogle, PA-C here for primary care    Concerns: Been having regular headaches again, taking aleve often.   Gets fontal and left headache, intense at times.  No nausea, no vomiting.   Sometimes phonophobia.  headaches are intermittent.  Several headaches per week.   She does still smoke.   She works swing shifts for the past 7 months which wears on her.  She thinks she needs to see eye doctor given blurriness of computer screen at times.   No paresthesias, no vision or hearing loss.   No severe headaches.    Sleep is fine.  Still gets occasional sores in armpits but hasn't been as bad in recent history.  She may want to restart Topamax to help with headaches and weight.  Reviewed their medical, surgical, family, social, medication, and allergy history and updated chart as appropriate.  Review of Systems Constitutional: -fever, -chills, -sweats, -unexpected weight change, -decreased appetite, -fatigue Allergy: +sneezing, +itching, +congestion Dermatology: -changing moles, --rash ENT: -runny nose, -ear pain, -sore throat, -hoarseness, -sinus pain, -teeth pain, - ringing in ears, -hearing loss, -nosebleeds Cardiology: -chest pain, -palpitations, -swelling, -difficulty breathing when lying flat, -waking up short of breath Respiratory: -cough, -shortness of breath, -difficulty breathing with exercise or exertion, -wheezing, -coughing up blood Gastroenterology: -abdominal pain, -nausea, -vomiting, -diarrhea, -constipation, -blood in stool, -changes in bowel movement, -difficulty swallowing or eating Hematology: -bleeding, -bruising  Musculoskeletal: -joint aches, -muscle aches, -joint swelling, -back pain, -neck pain, -cramping, -changes in gait Ophthalmology: denies vision changes, eye redness, itching,  discharge Urology: -burning with urination, -difficulty urinating, -blood in urine, -urinary frequency, -urgency, -incontinence Neurology: +headache, -weakness, -tingling, -numbness, -memory loss, -falls, -dizziness Psychology: -depressed mood, -agitation, -sleep problems   Objective:   Physical Exam  BP 118/78   Pulse 76   Ht 5\' 4"  (1.626 m)   Wt 194 lb 3.2 oz (88.1 kg)   SpO2 99%   BMI 33.33 kg/m   Wt Readings from Last 3 Encounters:  08/17/16 194 lb 3.2 oz (88.1 kg)  01/07/16 191 lb 3.2 oz (86.7 kg)  07/30/15 191 lb (86.6 kg)   General appearance: alert, no distress, WD/WN, obese African American female Skin: skin tags of neck, post auricular area on right, left face lateral to orbit, few scattered macules, no worrisome lesions HEENT: normocephalic, conjunctiva/corneas normal, sclerae anicteric, PERRLA, EOMi, nares patent, no discharge or erythema, pharynx normal Oral cavity: MMM, tongue normal, teeth normal Neck: supple, no lymphadenopathy, generalized goiter, normal ROM, no bruits Chest: non tender, normal shape and expansion Heart: RRR, normal S1, S2, no obvious murmurs Lungs: CTA bilaterally, no wheezes, rhonchi, or rales Abdomen: +bs, soft,  central upper, right upper and inferior to umbilicus port surgical scars, non tender, non distended, no masses, no hepatomegaly, no splenomegaly, no bruits Back: non tender, normal ROM, no scoliosis Musculoskeletal: Bilateral great toes deviate laterally causing overlap with second toes, there are dorsal calluses of bilateral second toe proximal phalanx, otherwise upper extremities non tender, no obvious deformity, normal ROM throughout, lower extremities non tender, no obvious deformity, normal ROM throughout Extremities: no edema, no cyanosis, no clubbing Pulses: 2+ symmetric, upper and lower extremities, normal cap refill Neurological: alert, oriented x 3, CN2-12 intact, strength normal upper extremities and lower extremities,  sensation normal throughout, DTRs 2+ throughout, no cerebellar signs,  gait normal Psychiatric: normal affect, pleasant  Breast: deferred at her request today Gyn/rectal - deferred today, on period   Assessment and Plan :    Encounter Diagnoses  Name Primary?  . Encounter for health maintenance examination in adult Yes  . Allergic rhinitis due to pollen, unspecified seasonality   . Thyroid goiter   . Impaired fasting blood sugar   . Hidradenitis suppurativa   . Tobacco use disorder   . Class 1 obesity with serious comorbidity and body mass index (BMI) of 33.0 to 33.9 in adult, unspecified obesity type   . History of anemia   . Insomnia, unspecified type   . Sinusitis, unspecified chronicity, unspecified location   . Vaccine counseling      Physical exam - discussed healthy lifestyle, diet, exercise, preventative care, vaccinations, and addressed their concerns.  Handout given. See your eye doctor yearly for routine vision care since she has had some recent vision changes, mild See your dentist yearly for routine dental care including hygiene visits twice yearly. Headaches - consider study, scan, f/u pending labs. Obesity - c/t efforts with healthy diet, exercise, consider pharmquest drug study pending labs Insomnia - not a problem of late compared to last year Thyroid nodule, goiter - reviewed 2016 repeat ultrasound. Stable, will check periodically.   Smoker - counseled on cessation Return at her convenience for breast/pelvic exam Follow-up pending labs  Recommendations:  Check with Employee Health and insurer about hepatitis A and B vaccine series.  I recommend both, but see which is cheaper for you, here vs Employee Health Schedule with an eye doctor yearly for routine vision care.  See your dentist yearly for routine dental care including hygiene visits twice yearly.  Follow up at your convenience for breast/pelvic exam  STOP SMOKING!  We will cal with lab  results  continue efforts to lose weight with healthy diet and exercise  Consider drug study for headache or weight loss vs restarting medication such as Contrave for weight loss and help with tobacco cessation, or Topamax for headaches and weight loss.  Kayson was seen today for annual exam.  Diagnoses and all orders for this visit:  Encounter for health maintenance examination in adult -     Comprehensive metabolic panel -     CBC -     Lipid panel -     TSH -     Hemoglobin A1c -     T4, free -     VITAMIN D 25 Hydroxy (Vit-D Deficiency, Fractures)  Allergic rhinitis due to pollen, unspecified seasonality  Thyroid goiter -     TSH -     T4, free  Impaired fasting blood sugar -     Hemoglobin A1c  Hidradenitis suppurativa  Tobacco use disorder  Class 1 obesity with serious comorbidity and body mass index (BMI) of 33.0 to 33.9 in adult, unspecified obesity type  History of anemia  Insomnia, unspecified type  Sinusitis, unspecified chronicity, unspecified location  Vaccine counseling

## 2016-08-17 NOTE — Patient Instructions (Signed)
Encounter Diagnoses  Name Primary?  . Encounter for health maintenance examination in adult Yes  . Allergic rhinitis due to pollen, unspecified seasonality   . Thyroid goiter   . Impaired fasting blood sugar   . Hidradenitis suppurativa   . Tobacco use disorder   . Class 1 obesity with serious comorbidity and body mass index (BMI) of 33.0 to 33.9 in adult, unspecified obesity type   . History of anemia   . Insomnia, unspecified type   . Sinusitis, unspecified chronicity, unspecified location   . Vaccine counseling    Recommendations:  Check with Employee Health and insurer about hepatitis A and B vaccine series.  I recommend both, but see which is cheaper for you, here vs Employee Health Schedule with an eye doctor yearly for routine vision care.  See your dentist yearly for routine dental care including hygiene visits twice yearly.  Follow up at your convenience for breast/pelvic exam  STOP SMOKING!  We will cal with lab results  continue efforts to lose weight with healthy diet and exercise  Consider drug study for headache or weight loss vs restarting medication such as Contrave for weight loss and help with tobacco cessation, or Topamax for headaches and weight loss.

## 2016-08-18 ENCOUNTER — Other Ambulatory Visit: Payer: Self-pay | Admitting: Medical

## 2016-08-18 DIAGNOSIS — R519 Headache, unspecified: Secondary | ICD-10-CM

## 2016-08-18 DIAGNOSIS — R51 Headache: Principal | ICD-10-CM

## 2016-08-18 DIAGNOSIS — G8929 Other chronic pain: Secondary | ICD-10-CM

## 2016-08-18 LAB — VITAMIN D 25 HYDROXY (VIT D DEFICIENCY, FRACTURES): Vit D, 25-Hydroxy: 30 ng/mL (ref 30–100)

## 2016-08-18 LAB — HEMOGLOBIN A1C
HEMOGLOBIN A1C: 5.2 % (ref <5.7)
MEAN PLASMA GLUCOSE: 103 mg/dL

## 2016-11-02 DIAGNOSIS — R102 Pelvic and perineal pain: Secondary | ICD-10-CM | POA: Diagnosis not present

## 2016-11-02 DIAGNOSIS — Z01419 Encounter for gynecological examination (general) (routine) without abnormal findings: Secondary | ICD-10-CM | POA: Diagnosis not present

## 2016-11-02 DIAGNOSIS — Z6833 Body mass index (BMI) 33.0-33.9, adult: Secondary | ICD-10-CM | POA: Diagnosis not present

## 2016-11-24 DIAGNOSIS — R102 Pelvic and perineal pain: Secondary | ICD-10-CM | POA: Diagnosis not present

## 2017-04-02 ENCOUNTER — Ambulatory Visit (HOSPITAL_COMMUNITY)
Admission: EM | Admit: 2017-04-02 | Discharge: 2017-04-02 | Disposition: A | Discharge status: Home / Self Care | Payer: 59 | Attending: Family Medicine | Admitting: Family Medicine

## 2017-04-02 ENCOUNTER — Encounter (HOSPITAL_COMMUNITY): Payer: Self-pay | Admitting: Family Medicine

## 2017-04-02 ENCOUNTER — Other Ambulatory Visit: Payer: Self-pay

## 2017-04-02 DIAGNOSIS — J40 Bronchitis, not specified as acute or chronic: Secondary | ICD-10-CM

## 2017-04-02 MED ORDER — PREDNISONE 10 MG PO TABS: 10.0000 mg | ORAL_TABLET | Freq: Every day | ORAL | 1 refills | Status: DC

## 2017-04-02 MED ORDER — AZITHROMYCIN 250 MG PO TABS: 250.0000 mg | ORAL_TABLET | Freq: Every day | ORAL | 0 refills | Status: DC

## 2017-04-02 MED ORDER — HYDROCODONE-HOMATROPINE 5-1.5 MG/5ML PO SYRP: 5.0000 mL | ORAL_SOLUTION | Freq: Four times a day (QID) | ORAL | 0 refills | Status: DC | PRN

## 2017-04-02 NOTE — ED Triage Notes (Signed)
Brown mucus, sore throat, cough, started Thursday

## 2017-04-02 NOTE — ED Provider Notes (Signed)
[facility]   [id number] 04/02/17 Arrival Time: 1943   SUBJECTIVE:  Jacqueline Winters is a 36 y.o. 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   Past Medical History:  Diagnosis Date  . Allergy   . GERD (gastroesophageal reflux disease)    with pregnancy  . Goiter   . Headache(784.0)   . Insomnia   . Obesity   . PONV (postoperative nausea and vomiting)   . Thyroid nodule    biopsy 07/2013, consult with Dr. Iran Planas endocrinology  . Tobacco use disorder    Family History  Problem Relation Age of Onset  . Hypertension Mother   . Diabetes Mother   . Hypertension Sister   . Cancer Maternal Grandmother        colon, ovarian  . Hypertension Sister   . Arthritis Father   . Cancer Maternal Aunt        breast  . Heart disease Neg Hx   . Stroke Neg Hx    Social History   Socioeconomic History  . Marital status: Married    Spouse name: Not on file  . Number of children: Not on file  . Years of education: Not on file  . Highest education level: Not on file  Social Needs  . Financial resource strain: Not on file  . Food insecurity - worry: Not on file  . Food insecurity - inability: Not on file  . Transportation needs - medical: Not on file  . Transportation needs - non-medical: Not on file  Occupational History  . Occupation: Optician, dispensing: [employer]  Tobacco Use  . Smoking status: Current Some Day Smoker    Packs/day: 0.30    Years: 12.00    Pack years: 3.60    Types: Cigarettes  . Smokeless tobacco: Never Used  Substance and Sexual Activity  . Alcohol use: Yes    Alcohol/week: 0.6 oz    Types: 1 Shots of liquor per week  . Drug use: No  . Sexual activity: Not on file  Other Topics Concern  . Not on file  Social History Narrative   Married, 3 children ages 50 years old, 36years old,  36yo, Darrick Meigs, exercises - zumba, fit camp.  Prior  travel nursing.  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  08/2016   Current Meds  Medication Sig  . b complex vitamins capsule Take 1 capsule by mouth daily.  . cetirizine (ZYRTEC) 10 MG tablet Take 10 mg by mouth as needed. Reported on 07/30/2015  . Multiple Vitamin (MULTIVITAMIN) tablet Take 1 tablet by mouth daily.  . mupirocin ointment (BACTROBAN) 2 % Place 1 application into the nose 2 (two) times daily.  . Pseudoephedrine-APAP-DM (DAYQUIL PO) Take by mouth.   Allergies  Allergen Reactions  . Vicodin [Hydrocodone-Acetaminophen]     uneasy      ROS: As per HPI, remainder of ROS negative.   OBJECTIVE:   Vitals:   04/02/17 2006  BP: (!) 121/92  Pulse: 64  Temp: 98.3 F (36.8 C)  TempSrc: Oral  SpO2: 99%     General appearance: alert; no distress Eyes: PERRL; EOMI; conjunctiva normal HENT: normocephalic; atraumatic; TMs normal, canal normal, external ears normal without trauma; nasal mucosa normal; oral mucosa normal Neck: supple Lungs: prolonged expiration to auscultation bilaterally Heart: regular rate and rhythm Back: no  CVA tenderness Extremities: no cyanosis or edema; symmetrical with no gross deformities Skin: warm and dry Neurologic: normal gait; grossly normal Psychological: alert and cooperative; normal mood and affect      Labs:  Results for orders placed or performed during the hospital encounter of 12/22/11  Surgical pcr screen  Result Value Ref Range   MRSA, PCR NEGATIVE NEGATIVE   Staphylococcus aureus NEGATIVE NEGATIVE  CBC  Result Value Ref Range   WBC 11.2 (H) 4.0 - 10.5 K/uL   RBC 4.03 3.87 - 5.11 MIL/uL   Hemoglobin 11.7 (L) 12.0 - 15.0 g/dL   HCT 35.0 (L) 36.0 - 46.0 %   MCV 86.8 78.0 - 100.0 fL   MCH 29.0 26.0 - 34.0 pg   MCHC 33.4 30.0 - 36.0 g/dL   RDW 13.3 11.5 - 15.5 %   Platelets 253 150 - 400 K/uL  Type and screen  Result Value Ref Range   ABO/RH(D) B POS    Antibody Screen NEG     Sample Expiration 12/24/2011     Labs Reviewed - No data to display  No results found.     ASSESSMENT & PLAN:  1. Bronchitis     Meds ordered this encounter  Medications  . predniSONE (DELTASONE) 10 MG tablet    Sig: Take 1 tablet (10 mg total) by mouth daily with breakfast.    Dispense:  3 tablet    Refill:  1  . HYDROcodone-homatropine (HYDROMET) 5-1.5 MG/5ML syrup    Sig: Take 5 mLs by mouth every 6 (six) hours as needed for cough.    Dispense:  60 mL    Refill:  0  . azithromycin (ZITHROMAX) 250 MG tablet    Sig: Take 1 tablet (250 mg total) by mouth daily. Take first 2 tablets together, then 1 every day until finished.    Dispense:  6 tablet    Refill:  0    Reviewed expectations re: course of current medical issues. Questions answered. Outlined signs and symptoms indicating need for more acute intervention. Patient verbalized understanding. After Visit Summary given.    Procedures:      Robyn Haber, MD 04/02/17 2020

## 2017-08-23 ENCOUNTER — Telehealth: Payer: 59 | Admitting: Family

## 2017-08-23 DIAGNOSIS — J301 Allergic rhinitis due to pollen: Secondary | ICD-10-CM | POA: Diagnosis not present

## 2017-08-23 MED ORDER — TRIAMCINOLONE ACETONIDE 55 MCG/ACT NA AERO: 2.0000 | INHALATION_SPRAY | Freq: Every day | NASAL | 12 refills | Status: DC

## 2017-08-23 MED ORDER — CETIRIZINE HCL 10 MG PO TABS: 10.0000 mg | ORAL_TABLET | Freq: Every day | ORAL | 11 refills | Status: DC

## 2017-08-23 NOTE — Progress Notes (Signed)
E visit for Allergic Rhinitis We are sorry that you are not feeling well.  Her is how we plan to help!  Based on what you have shared with me it looks like you have Allergic Rhinitis.  Rhinitis is when a reaction occurs that causes nasal congestion, runny nose, sneezing, and itching.  Most types of rhinitis are caused by an inflammation and are associated with symptoms in the eyes ears or throat. There are several types of rhinitis.  The most common are acute rhinitis, which is usually caused by a viral illness, allergic or seasonal rhinitis, and nonallergic or year-round rhinitis.  Nasal allergies occur certain times of the year.  Allergic rhinitis is caused when allergens in the air trigger the release of histamine in the body.  Histamine causes itching, swelling, and fluid to build up in the fragile linings of the nasal passages, sinuses and eyelids.  An itchy nose and clear discharge are common.  I recommend the following over the counter treatments: You should take a daily dose of antihistamine, zyrtec 10 mg daily.  I also would recommend a nasal spray: Nasonex 2 sprays into each nostril once daily    HOME CARE:   You can use an over-the-counter saline nasal spray as needed  Avoid areas where there is heavy dust, mites, or molds  Stay indoors on windy days during the pollen season  Keep windows closed in home, at least in bedroom; use air conditioner.  Use high-efficiency house air filter  Keep windows closed in car, turn AC on re-circulate  Avoid playing out with dog during pollen season  GET HELP RIGHT AWAY IF:   If your symptoms do not improve within 10 days  You become short of breath  You develop yellow or green discharge from your nose for over 3 days  You have coughing fits  MAKE SURE YOU:   Understand these instructions  Will watch your condition  Will get help right away if you are not doing well or get worse  Thank you for choosing an e-visit. Your  e-visit answers were reviewed by a board certified advanced clinical practitioner to complete your personal care plan. Depending upon the condition, your plan could have included both over the counter or prescription medications. Please review your pharmacy choice. Be sure that the pharmacy you have chosen is open so that you can pick up your prescription now.  If there is a problem you may message your provider in [place] to have the prescription routed to another pharmacy. Your safety is important to [place]. If you have drug allergies check your prescription carefully.  For the next 24 hours, you can use MyChart to ask questions about today's visit, request a non-urgent call back, or ask for a work or school excuse from your e-visit provider. You will get an email in the next two days asking about your experience. I hope that your e-visit has been valuable and will speed your recovery.

## 2017-08-25 MED FILL — NASACORT ALLERGY 24HR SPRAY: 55 MCG | 30 days supply | Qty: 11 | Fill #0

## 2017-08-25 MED FILL — CETIRIZINE HCL 10 MG TABS: 10 | 90 days supply | Qty: 90 | Fill #0

## 2017-09-19 DIAGNOSIS — H52223 Regular astigmatism, bilateral: Secondary | ICD-10-CM | POA: Diagnosis not present

## 2017-10-09 ENCOUNTER — Ambulatory Visit: Payer: 59 | Admitting: Medical

## 2017-10-09 VITALS — BP 110/80 | HR 80 | Temp 98.6°F | Resp 16 | Ht 64.5 in | Wt 198.2 lb

## 2017-10-09 DIAGNOSIS — E049 Nontoxic goiter, unspecified: Secondary | ICD-10-CM | POA: Diagnosis not present

## 2017-10-09 DIAGNOSIS — R239 Unspecified skin changes: Secondary | ICD-10-CM

## 2017-10-09 DIAGNOSIS — L709 Acne, unspecified: Secondary | ICD-10-CM

## 2017-10-09 DIAGNOSIS — L659 Nonscarring hair loss, unspecified: Secondary | ICD-10-CM

## 2017-10-09 DIAGNOSIS — R5383 Other fatigue: Secondary | ICD-10-CM | POA: Diagnosis not present

## 2017-10-09 DIAGNOSIS — L732 Hidradenitis suppurativa: Secondary | ICD-10-CM

## 2017-10-09 MED ORDER — BENZOYL PEROXIDE 5 % EX LIQD: Freq: Two times a day (BID) | CUTANEOUS | 12 refills | Status: DC

## 2017-10-09 MED ORDER — MINOCYCLINE HCL 100 MG PO TABS
100.0000 mg | ORAL_TABLET | Freq: Every day | ORAL | 1 refills | Status: DC
Start: 2017-10-09 — End: 2018-02-20

## 2017-10-09 MED ORDER — CETAPHIL MOISTURIZING EX LOTN: 1.0000 "application " | TOPICAL_LOTION | CUTANEOUS | 2 refills | Status: DC | PRN

## 2017-10-09 MED FILL — CETAPHIL MOISTURIZING LOT: 90 days supply | Qty: 237 | Fill #0

## 2017-10-09 MED FILL — MINOCYCLINE 100 MG CAPSULE: 100 | 30 days supply | Qty: 30 | Fill #0

## 2017-10-09 MED FILL — BENZOYL PEROXIDE 5% WASH: 5 | 30 days supply | Qty: 148 | Fill #0

## 2017-10-09 NOTE — Progress Notes (Signed)
Subjective: Chief Complaint  Patient presents with  . rash    rash on face, fatigue, hair shedding   Here for several concerns.  For the last 2 months is been having problems with rash/acne bumps on face somewhat on chest and upper back as well but mostly on face.  She has been doing online classes under more stress, also not eating as healthy being busy with schoolwork.  In the past we have treated her for hidradenitis with doxycycline which help clear that up.  She would like to try something with this.  She notes ongoing fatigue worse in recent months.  She noticed some hair thinning as well.  She has a history of thyroid goiter, biopsy in recent years.  Past Medical History:  Diagnosis Date  . Allergy   . GERD (gastroesophageal reflux disease)    with pregnancy  . Goiter   . Headache(784.0)   . Insomnia   . Obesity   . PONV (postoperative nausea and vomiting)   . Thyroid nodule    biopsy 07/2013, consult with Dr. Iran Planas endocrinology  . Tobacco use disorder    Current Outpatient Medications on File Prior to Visit  Medication Sig Dispense Refill  . b complex vitamins capsule Take 1 capsule by mouth daily.    . cetirizine (ZYRTEC) 10 MG tablet Take 1 tablet (10 mg total) by mouth daily. 30 tablet 11  . ibuprofen (ADVIL,MOTRIN) 200 MG tablet Take 400 mg by mouth every 6 (six) hours as needed. Reported on 07/30/2015    . Multiple Vitamin (MULTIVITAMIN) tablet Take 1 tablet by mouth daily.    . mupirocin ointment (BACTROBAN) 2 % Place 1 application into the nose 2 (two) times daily. 30 g 2  . triamcinolone (NASACORT) 55 MCG/ACT AERO nasal inhaler Place 2 sprays into the nose daily. 1 Inhaler 12   No current facility-administered medications on file prior to visit.    ROS as in subjective    Objective: BP 110/80   Pulse 80   Temp 98.6 F (37 C) (Oral)   Resp 16   Ht 5' 4.5" (1.638 m)   Wt 198 lb 3.2 oz (89.9 kg)   SpO2 98%   BMI 33.50 kg/m   General  appearance: alert, no distress, WD/WN,  Skin: Moderate acne of the face some irritated erythematous comedones , right axilla with scarred tissue from prior hidradenitis issues, one area of thickened skin that may be scar versus ongoing cystic lesion oral cavity: MMM, no lesions Neck: supple, no lymphadenopathy, no thyromegaly, no masses Pulses: 2+ symmetric, upper and lower extremities, normal cap refill    Assessment: Encounter Diagnoses  Name Primary?  . Acne, unspecified acne type Yes  . Skin change   . Thyroid goiter   . Fatigue, unspecified type   . Hair thinning   . Hidradenitis axillaris     Plan: Acne-begin minocycline once daily, benzyl peroxide topical twice daily unless it irritates the skin and then can drop back to once daily.  Follow-up in 1 month  Thyroid labs today given the fatigue, skin changes, hair changes  Thyroid goiter-when we see her soon for physical we can discuss repeat ultrasound of thyroid  Hidradenitis- begin minocycline, if not improving may need to see plastic surgery   Javon was seen today for rash.  Diagnoses and all orders for this visit:  Acne, unspecified acne type  Skin change  Thyroid goiter -     TSH -     T4,  free -     CBC  Fatigue, unspecified type -     TSH -     T4, free -     CBC  Hair thinning  Hidradenitis axillaris  Other orders -     cetaphil (CETAPHIL) lotion; Apply 1 application topically as needed for dry skin. -     minocycline (DYNACIN) 100 MG tablet; Take 1 tablet (100 mg total) by mouth daily. -     benzoyl peroxide (BENZOYL PEROXIDE) 5 % external liquid; Apply topically 2 (two) times daily.

## 2017-10-10 LAB — TSH: TSH: 0.618 u[IU]/mL (ref 0.450–4.500)

## 2017-10-10 LAB — CBC
Hematocrit: 40.6 % (ref 34.0–46.6)
Hemoglobin: 13.2 g/dL (ref 11.1–15.9)
MCH: 28.8 pg (ref 26.6–33.0)
MCHC: 32.5 g/dL (ref 31.5–35.7)
MCV: 89 fL (ref 79–97)
PLATELETS: 297 10*3/uL (ref 150–450)
RBC: 4.59 x10E6/uL (ref 3.77–5.28)
RDW: 14.7 % (ref 12.3–15.4)
WBC: 8.8 10*3/uL (ref 3.4–10.8)

## 2017-10-10 LAB — T4, FREE: FREE T4: 1.28 ng/dL (ref 0.82–1.77)

## 2017-10-25 ENCOUNTER — Other Ambulatory Visit: Payer: Self-pay | Admitting: Medical

## 2017-10-25 ENCOUNTER — Telehealth: Payer: Self-pay | Admitting: Family Medicine

## 2017-10-25 MED ORDER — FLUCONAZOLE 150 MG PO TABS: ORAL_TABLET | ORAL | 0 refills | Status: DC

## 2017-10-25 NOTE — Telephone Encounter (Signed)
Med sent.

## 2017-10-25 NOTE — Telephone Encounter (Signed)
Pt called and is stating that she is having some vaginal itching and yeast states it is coming from the minocycline that she started,, states every time she starts a antibiotic she gets a yeast infection, she is wondering if she can get something for this, pt would like it sent to the [place], [place] - [address] [facility]. And pt can be reached at [phone]

## 2017-10-26 MED FILL — FLUCONAZOLE 150 MG TABS: 150 | 7 days supply | Qty: 2 | Fill #0

## 2017-10-26 NOTE — Telephone Encounter (Signed)
Called and informed pt that rx was sent to the pharmacy

## 2017-11-10 MED FILL — MINOCYCLINE 100 MG CAPSULE: 100 | 30 days supply | Qty: 30 | Fill #1

## 2017-11-27 ENCOUNTER — Other Ambulatory Visit (HOSPITAL_COMMUNITY)
Admission: RE | Admit: 2017-11-27 | Discharge: 2017-11-27 | Disposition: A | Discharge status: Home / Self Care | Payer: 59 | Source: Ambulatory Visit | Attending: Medical | Admitting: Medical

## 2017-11-27 ENCOUNTER — Encounter: Payer: Self-pay | Admitting: Medical

## 2017-11-27 ENCOUNTER — Ambulatory Visit (INDEPENDENT_AMBULATORY_CARE_PROVIDER_SITE_OTHER): Payer: 59 | Admitting: Medical

## 2017-11-27 ENCOUNTER — Encounter: Payer: 59 | Admitting: Medical

## 2017-11-27 ENCOUNTER — Telehealth: Payer: Self-pay | Admitting: Family Medicine

## 2017-11-27 VITALS — BP 110/78 | HR 70 | Temp 98.4°F | Resp 16 | Ht 64.0 in | Wt 202.0 lb

## 2017-11-27 DIAGNOSIS — Z124 Encounter for screening for malignant neoplasm of cervix: Secondary | ICD-10-CM | POA: Diagnosis not present

## 2017-11-27 DIAGNOSIS — G4700 Insomnia, unspecified: Secondary | ICD-10-CM | POA: Diagnosis not present

## 2017-11-27 DIAGNOSIS — L709 Acne, unspecified: Secondary | ICD-10-CM | POA: Diagnosis not present

## 2017-11-27 DIAGNOSIS — Z1151 Encounter for screening for human papillomavirus (HPV): Secondary | ICD-10-CM | POA: Diagnosis not present

## 2017-11-27 DIAGNOSIS — R7301 Impaired fasting glucose: Secondary | ICD-10-CM | POA: Diagnosis not present

## 2017-11-27 DIAGNOSIS — F17200 Nicotine dependence, unspecified, uncomplicated: Secondary | ICD-10-CM | POA: Diagnosis not present

## 2017-11-27 DIAGNOSIS — J301 Allergic rhinitis due to pollen: Secondary | ICD-10-CM | POA: Diagnosis not present

## 2017-11-27 DIAGNOSIS — Z01419 Encounter for gynecological examination (general) (routine) without abnormal findings: Secondary | ICD-10-CM | POA: Diagnosis not present

## 2017-11-27 DIAGNOSIS — L732 Hidradenitis suppurativa: Secondary | ICD-10-CM | POA: Diagnosis not present

## 2017-11-27 DIAGNOSIS — Z0000 Encounter for general adult medical examination without abnormal findings: Secondary | ICD-10-CM

## 2017-11-27 DIAGNOSIS — E049 Nontoxic goiter, unspecified: Secondary | ICD-10-CM | POA: Diagnosis not present

## 2017-11-27 DIAGNOSIS — Z131 Encounter for screening for diabetes mellitus: Secondary | ICD-10-CM

## 2017-11-27 DIAGNOSIS — Z7189 Other specified counseling: Secondary | ICD-10-CM

## 2017-11-27 DIAGNOSIS — R87612 Low grade squamous intraepithelial lesion on cytologic smear of cervix (LGSIL): Secondary | ICD-10-CM | POA: Diagnosis not present

## 2017-11-27 DIAGNOSIS — Z7185 Encounter for immunization safety counseling: Secondary | ICD-10-CM

## 2017-11-27 LAB — POCT URINALYSIS DIP (PROADVANTAGE DEVICE)
BILIRUBIN UA: NEGATIVE
BILIRUBIN UA: NEGATIVE mg/dL
Blood, UA: NEGATIVE
GLUCOSE UA: NEGATIVE mg/dL
Leukocytes, UA: NEGATIVE
Nitrite, UA: NEGATIVE
PROTEIN UA: NEGATIVE mg/dL
SPECIFIC GRAVITY, URINE: 1.005
Urobilinogen, Ur: 3.5
pH, UA: 6 (ref 5.0–8.0)

## 2017-11-27 LAB — RESULTS CONSOLE HPV: CHL HPV: NEGATIVE

## 2017-11-27 LAB — HM PAP SMEAR

## 2017-11-27 MED ORDER — NALTREXONE-BUPROPION HCL ER 8-90 MG PO TB12: 2.0000 | ORAL_TABLET | Freq: Two times a day (BID) | ORAL | 1 refills | Status: DC

## 2017-11-27 MED FILL — BENZOYL PEROXIDE 5% WASH: 5 | 30 days supply | Qty: 148 | Fill #1

## 2017-11-27 NOTE — Telephone Encounter (Signed)
Lets start 1 tablet daily x 2 weeks, then change to 1 tablet BID and stay at that dose for now, #60, 1 refill

## 2017-11-27 NOTE — Telephone Encounter (Signed)
Called to pharmacy and they state that need PA.

## 2017-11-27 NOTE — Telephone Encounter (Signed)
Per [place], Naltrexone-Bupropion directions need clarification. Per pharmacist, the tapering directions appear to be missing a weeks directions. Also the #120 quantity can not be given. This med will require a prior auth when they put it through insurance. Usually this med starts with the starter dosing with tapering instructions then goes over to the maintenance dosage. Call provider line at the paharmcy at [phone] to clarify and discuss further.

## 2017-11-27 NOTE — Progress Notes (Signed)
Subjective:   HPI  Jacqueline Winters is a 37 y.o. female who presents for a complete physical.  Medical care team includes:  Dr. Iran Planas, endocrinology, in remote past Dorothea Ogle, PA-C here for primary care    Concerns: Smoking a pack every 3 days.m  Wants to try Contrave we discussed prior.   Has tried using the 1-800-QUIT-NOW hotline prior.  Gets flu shot free at work.  Doing SBE, no recent lumps felt.      Menstrual periods more intense in recent years since scar tissue.    Periods are somewhat regular.   Generally around 1st of the month.   LMP July 31.  Sometimes periods ok for 3 days, sometimes 5 days very crampy.     Had pelvic US normal last year through OB/Gyn.  Using the minocycline for acne.  Sees some improvement so far.  Reviewed their medical, surgical, family, social, medication, and allergy history and updated chart as appropriate.  Review of Systems Constitutional: -fever, -chills, -sweats, -unexpected weight change, -decreased appetite, -fatigue Allergy: -sneezing, -itching, -congestion Dermatology: -changing moles, --rash, + acne ENT: -runny nose, -ear pain, -sore throat, -hoarseness, -sinus pain, -teeth pain, - ringing in ears, -hearing loss, -nosebleeds Cardiology: -chest pain, -palpitations, -swelling, -difficulty breathing when lying flat, -waking up short of breath Respiratory: -cough, -shortness of breath, -difficulty breathing with exercise or exertion, -wheezing, -coughing up blood Gastroenterology: -abdominal pain, -nausea, -vomiting, -diarrhea, -constipation, -blood in stool, -changes in bowel movement, -difficulty swallowing or eating Hematology: -bleeding, -bruising  Musculoskeletal: -joint aches, -muscle aches, -joint swelling, -back pain, -neck pain, -cramping, -changes in gait Ophthalmology: denies vision changes, eye redness, itching, discharge Urology: -burning with urination, -difficulty urinating, -blood in urine, -urinary frequency,  -urgency, -incontinence Neurology: -headache, -weakness, -tingling, -numbness, -memory loss, -falls, -dizziness Psychology: -depressed mood, -agitation, -sleep problems  Past Medical History:  Diagnosis Date  . Allergy   . GERD (gastroesophageal reflux disease)    with pregnancy  . Goiter   . Headache(784.0)   . Hidradenitis axillaris   . Impaired fasting blood sugar   . Insomnia   . Obesity   . PONV (postoperative nausea and vomiting)   . Thyroid nodule    biopsy 07/2013, consult with Dr. Iran Planas endocrinology  . Tobacco use disorder     Past Surgical History:  Procedure Laterality Date  . CESAREAN SECTION  12/21/2011   Procedure: CESAREAN SECTION;  Surgeon: Farrel Gobble. Harrington Challenger, MD;  Location: [facility];  Service: Obstetrics;  Laterality: N/A;  Bilateral Tubal Ligation  . CHOLECYSTECTOMY  2009  . INCISION AND DRAINAGE     right axillary boils  . PILONIDAL CYST / SINUS EXCISION  2008   [facility]  . TUBAL LIGATION    . VAGINAL DELIVERY  2002, 1998    Social History   Socioeconomic History  . Marital status: Married    Spouse name: Not on file  . Number of children: Not on file  . Years of education: Not on file  . Highest education level: Not on file  Occupational History  . Occupation: Optician, dispensing: [employer]  . Financial resource strain: Not on file  . Food insecurity:    Worry: Not on file    Inability: Not on file  . Transportation needs:    Medical: Not on file    Non-medical: Not on file  Tobacco Use  . Smoking status: Current Some Day Smoker    Packs/day: 0.30  Years: 12.00    Pack years: 3.60    Types: Cigarettes  . Smokeless tobacco: Never Used  Substance and Sexual Activity  . Alcohol use: Yes    Alcohol/week: 1.0 standard drinks    Types: 1 Shots of liquor per week  . Drug use: No  . Sexual activity: Not on file  Lifestyle  . Physical activity:    Days per week: Not on file    Minutes per session: Not on  file  . Stress: Not on file  Relationships  . Social connections:    Talks on phone: Not on file    Gets together: Not on file    Attends religious service: Not on file    Active member of club or organization: Not on file    Attends meetings of clubs or organizations: Not on file    Relationship status: Not on file  . Intimate partner violence:    Fear of current or ex partner: Not on file    Emotionally abused: Not on file    Physically abused: Not on file    Forced sexual activity: Not on file  Other Topics Concern  . Not on file  Social History Narrative   Married, 3 children ages 106 years old, 48 years old,  37yo, Darrick Meigs, exercises - zumba, fit camp.  Prior travel nursing.  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  11/2017    Family History  Problem Relation Age of Onset  . Hypertension Mother   . Diabetes Mother   . Hypertension Sister   . Cancer Maternal Grandmother        colon, ovarian  . Hypertension Sister   . Arthritis Father   . Cancer Maternal Aunt        breast  . Heart disease Neg Hx   . Stroke Neg Hx      Current Outpatient Medications:  .  b complex vitamins capsule, Take 1 capsule by mouth daily., Disp: , Rfl:  .  benzoyl peroxide (BENZOYL PEROXIDE) 5 % external liquid, Apply topically 2 (two) times daily., Disp: 142 g, Rfl: 12 .  cetaphil (CETAPHIL) lotion, Apply 1 application topically as needed for dry skin., Disp: 473 mL, Rfl: 2 .  cetirizine (ZYRTEC) 10 MG tablet, Take 1 tablet (10 mg total) by mouth daily., Disp: 30 tablet, Rfl: 11 .  minocycline (DYNACIN) 100 MG tablet, Take 1 tablet (100 mg total) by mouth daily., Disp: 30 tablet, Rfl: 1 .  Multiple Vitamin (MULTIVITAMIN) tablet, Take 1 tablet by mouth daily., Disp: , Rfl:  .  mupirocin ointment (BACTROBAN) 2 %, Place 1 application into the nose 2 (two) times daily., Disp: 30 g, Rfl: 2 .  triamcinolone (NASACORT) 55 MCG/ACT AERO nasal inhaler, Place 2 sprays  into the nose daily., Disp: 1 Inhaler, Rfl: 12 .  fluconazole (DIFLUCAN) 150 MG tablet, 1 tablet po once, may repeat in a week (Patient not taking: Reported on 11/27/2017), Disp: 2 tablet, Rfl: 0 .  ibuprofen (ADVIL,MOTRIN) 200 MG tablet, Take 400 mg by mouth every 6 (six) hours as needed. Reported on 07/30/2015, Disp: , Rfl:   Allergies  Allergen Reactions  . Vicodin [Hydrocodone-Acetaminophen]     uneasy     Objective:   Physical Exam  BP 110/78   Pulse 70   Temp 98.4 F (36.9 C) (Oral)   Resp 16   Ht 5\' 4"  (1.626 m)   Wt 202 lb (91.6 kg)  SpO2 99%   BMI 34.67 kg/m   Wt Readings from Last 3 Encounters:  11/27/17 202 lb (91.6 kg)  10/09/17 198 lb 3.2 oz (89.9 kg)  08/17/16 194 lb 3.2 oz (88.1 kg)   General appearance: alert, no distress, WD/WN,  African American female Skin: skin tags of neck, post auricular area on right, left face lateral to orbit, mild to moderate facial comedones, few scattered macules, no worrisome lesions HEENT: normocephalic, conjunctiva/corneas normal, sclerae anicteric, PERRLA, EOMi, nares patent, no discharge or erythema, pharynx normal Oral cavity: MMM, tongue normal, teeth normal Neck: supple, no lymphadenopathy, generalized goiter, no obvious nodules, normal ROM, no bruits Chest: non tender, normal shape and expansion Heart: RRR, normal S1, S2, no obvious murmurs Lungs: CTA bilaterally, no wheezes, rhonchi, or rales Abdomen: +bs, soft,  central upper, right upper and inferior to umbilicus port surgical scars, non tender, non distended, no masses, no hepatomegaly, no splenomegaly, no bruits Back: non tender, normal ROM, no scoliosis Musculoskeletal: Bilateral great toes deviate laterally causing overlap with second toes, there are dorsal calluses of bilateral second toe proximal phalanx, otherwise upper extremities non tender, no obvious deformity, normal ROM throughout, lower extremities non tender, no obvious deformity, normal ROM  throughout Extremities: no edema, no cyanosis, no clubbing Pulses: 2+ symmetric, upper and lower extremities, normal cap refill Neurological: alert, oriented x 3, CN2-12 intact, strength normal upper extremities and lower extremities, sensation normal throughout, DTRs 2+ throughout, no cerebellar signs, gait normal Psychiatric: normal affect, pleasant   Breast: nontender, no masses or lumps, no skin changes, no nipple discharge or inversion, no axillary lymphadenopathy Gyn: Normal external genitalia without lesions, vagina with normal mucosa, cervix with mild erythema, no cervical motion tenderness, no abnormal vaginal discharge.  Uterus and adnexa not enlarged, nontender, no masses.  Pap performed.  Exam chaperoned by nurse. Rectal: anus normal appearing    Assessment and Plan :    Encounter Diagnoses  Name Primary?  . Routine general medical examination at a health care facility Yes  . Allergic rhinitis due to pollen, unspecified seasonality   . Impaired fasting blood sugar   . Thyroid goiter   . Hidradenitis axillaris   . Acne, unspecified acne type   . Insomnia, unspecified type   . Tobacco use disorder   . Vaccine counseling   . Screening for cervical cancer   . Screening for diabetes mellitus      Physical exam - discussed healthy lifestyle, diet, exercise, preventative care, vaccinations, and addressed their concerns.  Handout given. See your eye doctor yearly for routine vision care since she has had some recent vision changes, mild See your dentist yearly for routine dental care including hygiene visits twice yearly.  She will get free flu shot at work, and will send [place] copy of the documentation  Obesity - c/t efforts with healthy diet, exercise,  Begin trial of Contrave. Discussed risks/benefits of medication.   Contraception - s/p tubal ligation  Insomnia - no recent concerns  Thyroid nodule, goiter - reviewed 2016 ultrasound. Stable, repeat ultrasound ordered.     Smoker - counseled on cessation, begin trial of Contrave, advised she contact 1-800-QUIT-NOW for counseling  Acne, hidradenitis - c/t minocycline oral, topical benzoyl peroxide, call report 110mo  We will cal with lab results   Shaquita was seen today for cpe.  Diagnoses and all orders for this visit:  Routine general medical examination at a health care facility -     POCT Urinalysis DIP (Proadvantage Device) -  Comprehensive metabolic panel -     Hemoglobin A1c -     Cytology - PAP -     Lipid panel -     VITAMIN D 25 Hydroxy (Vit-D Deficiency, Fractures) -     US Soft Tissue Head/Neck; Future  Allergic rhinitis due to pollen, unspecified seasonality  Impaired fasting blood sugar -     Hemoglobin A1c  Thyroid goiter -     US Soft Tissue Head/Neck; Future  Hidradenitis axillaris  Acne, unspecified acne type  Insomnia, unspecified type  Tobacco use disorder  Vaccine counseling  Screening for cervical cancer -     Cytology - PAP  Screening for diabetes mellitus

## 2017-11-27 NOTE — Patient Instructions (Signed)
Thanks for trusting [place] with your health care and for coming in for a physical today.  Below are some general recommendations I have for you:  Yearly screenings See your eye doctor yearly for routine vision care. See your dentist yearly for routine dental care including hygiene visits twice yearly. See me here yearly for a routine physical and preventative care visit  Begin Contrave medication for help with stopping tobacco and for help with cravings and weight loss efforts    Please follow up yearly for a physical.    I have included other useful information below for your review.  Preventative Care for Adults - Female      MAINTAIN REGULAR HEALTH EXAMS:  A routine yearly physical is a good way to check in with your primary care provider about your health and preventive screening. It is also an opportunity to share updates about your health and any concerns you have, and receive a thorough all-over exam.   Most health insurance companies pay for at least some preventative services.  Check with your health plan for specific coverages.  WHAT PREVENTATIVE SERVICES DO WOMEN NEED?  Adult women should have their weight and blood pressure checked regularly.   Women age 8 and older should have their cholesterol levels checked regularly.  Women should be screened for cervical cancer with a Pap smear and pelvic exam beginning at either age 47, or 3 years after they become sexually activity.    Breast cancer screening generally begins at age 37 with a mammogram and breast exam by your primary care provider.    Beginning at age 15 and continuing to age 36, women should be screened for colorectal cancer.  Certain people may need continued testing until age 57.  Updating vaccinations is part of preventative care.  Vaccinations help protect against diseases such as the flu.  Osteoporosis is a disease in which the bones lose minerals and strength as we age. Women ages 40 and over should  discuss this with their caregivers, as should women after menopause who have other risk factors.  Lab tests are generally done as part of preventative care to screen for anemia and blood disorders, to screen for problems with the kidneys and liver, to screen for bladder problems, to check blood sugar, and to check your cholesterol level.  Preventative services generally include counseling about diet, exercise, avoiding tobacco, drugs, excessive alcohol consumption, and sexually transmitted infections.    GENERAL RECOMMENDATIONS FOR GOOD HEALTH:  Healthy diet:  Eat a variety of foods, including fruit, vegetables, animal or vegetable protein, such as meat, fish, chicken, and eggs, or beans, lentils, tofu, and grains, such as rice.  Drink plenty of water daily.  Decrease saturated fat in the diet, avoid lots of red meat, processed foods, sweets, fast foods, and fried foods.  Exercise:  Aerobic exercise helps maintain good heart health. At least 30-40 minutes of moderate-intensity exercise is recommended. For example, a brisk walk that increases your heart rate and breathing. This should be done on most days of the week.   Find a type of exercise or a variety of exercises that you enjoy so that it becomes a part of your daily life.  Examples are running, walking, swimming, water aerobics, and biking.  For motivation and support, explore group exercise such as aerobic class, spin class, Zumba, Yoga,or  martial arts, etc.    Set exercise goals for yourself, such as a certain weight goal, walk or run in a race such as  a 5k walk/run.  Speak to your primary care provider about exercise goals.  Disease prevention:  If you smoke or chew tobacco, find out from your caregiver how to quit. It can literally save your life, no matter how long you have been a tobacco user. If you do not use tobacco, never begin.   Maintain a healthy diet and normal weight. Increased weight leads to problems with blood  pressure and diabetes.   The Body Mass Index or BMI is a way of measuring how much of your body is fat. Having a BMI above 27 increases the risk of heart disease, diabetes, hypertension, stroke and other problems related to obesity. Your caregiver can help determine your BMI and based on it develop an exercise and dietary program to help you achieve or maintain this important measurement at a healthful level.  High blood pressure causes heart and blood vessel problems.  Persistent high blood pressure should be treated with medicine if weight loss and exercise do not work.   Fat and cholesterol leaves deposits in your arteries that can block them. This causes heart disease and vessel disease elsewhere in your body.  If your cholesterol is found to be high, or if you have heart disease or certain other medical conditions, then you may need to have your cholesterol monitored frequently and be treated with medication.   Ask if you should have a cardiac stress test if your history suggests this. A stress test is a test done on a treadmill that looks for heart disease. This test can find disease prior to there being a problem.  Menopause can be associated with physical symptoms and risks. Hormone replacement therapy is available to decrease these. You should talk to your caregiver about whether starting or continuing to take hormones is right for you.   Osteoporosis is a disease in which the bones lose minerals and strength as we age. This can result in serious bone fractures. Risk of osteoporosis can be identified using a bone density scan. Women ages 46 and over should discuss this with their caregivers, as should women after menopause who have other risk factors. Ask your caregiver whether you should be taking a calcium supplement and Vitamin D, to reduce the rate of osteoporosis.   Avoid drinking alcohol in excess (more than two drinks per day).  Avoid use of street drugs. Do not share needles with  anyone. Ask for professional help if you need assistance or instructions on stopping the use of alcohol, cigarettes, and/or drugs.  Brush your teeth twice a day with fluoride toothpaste, and floss once a day. Good oral hygiene prevents tooth decay and gum disease. The problems can be painful, unattractive, and can cause other health problems. Visit your dentist for a routine oral and dental check up and preventive care every 6-12 months.   Look at your skin regularly.  Use a mirror to look at your back. Notify your caregivers of changes in moles, especially if there are changes in shapes, colors, a size larger than a pencil eraser, an irregular border, or development of new moles.  Safety:  Use seatbelts 100% of the time, whether driving or as a passenger.  Use safety devices such as hearing protection if you work in environments with loud noise or significant background noise.  Use safety glasses when doing any work that could send debris in to the eyes.  Use a helmet if you ride a bike or motorcycle.  Use appropriate safety gear for contact  sports.  Talk to your caregiver about gun safety.  Use sunscreen with a SPF (or skin protection factor) of 15 or greater.  Lighter skinned people are at a greater risk of skin cancer. Don't forget to also wear sunglasses in order to protect your eyes from too much damaging sunlight. Damaging sunlight can accelerate cataract formation.   Practice safe sex. Use condoms. Condoms are used for birth control and to help reduce the spread of sexually transmitted infections (or STIs).  Some of the STIs are gonorrhea (the clap), chlamydia, syphilis, trichomonas, herpes, HPV (human papilloma virus) and HIV (human immunodeficiency virus) which causes AIDS. The herpes, HIV and HPV are viral illnesses that have no cure. These can result in disability, cancer and death.   Keep carbon monoxide and smoke detectors in your home functioning at all times. Change the batteries every  6 months or use a model that plugs into the wall.   Vaccinations:  Stay up to date with your tetanus shots and other required immunizations. You should have a booster for tetanus every 10 years. Be sure to get your flu shot every year, since 5%-20% of the [place] population comes down with the flu. The flu vaccine changes each year, so being vaccinated once is not enough. Get your shot in the fall, before the flu season peaks.   Other vaccines to consider:  Human Papilloma Virus or HPV causes cancer of the cervix, and other infections that can be transmitted from person to person. There is a vaccine for HPV, and females should get immunized between the ages of 29 and 57. It requires a series of 3 shots.   Pneumococcal vaccine to protect against certain types of pneumonia.  This is normally recommended for adults age 50 or older.  However, adults younger than 36 years old with certain underlying conditions such as diabetes, heart or lung disease should also receive the vaccine.  Shingles vaccine to protect against Varicella Zoster if you are older than age 45, or younger than 36 years old with certain underlying illness.  If you have not had the Shingrix vaccine, please call your insurer to inquire about coverage for the Shingrix vaccine given in 2 doses.   Some insurers cover this vaccine after age 53, some cover this after age 15.  If your insurer covers this, then call to schedule appointment to have this vaccine here  Hepatitis A vaccine to protect against a form of infection of the liver by a virus acquired from food.  Hepatitis B vaccine to protect against a form of infection of the liver by a virus acquired from blood or body fluids, particularly if you work in health care.  If you plan to travel internationally, check with your local health department for specific vaccination recommendations.  Cancer Screening:  Breast cancer screening is essential to preventive care for women. All women age  5 and older should perform a breast self-exam every month. At age 40 and older, women should have their caregiver complete a breast exam each year. Women at ages 59 and older should have a mammogram (x-ray film) of the breasts. Your caregiver can discuss how often you need mammograms.    Cervical cancer screening includes taking a Pap smear (sample of cells examined under a microscope) from the cervix (end of the uterus). It also includes testing for HPV (Human Papilloma Virus, which can cause cervical cancer). Screening and a pelvic exam should begin at age 53, or 3 years after a woman  becomes sexually active. Screening should occur every year, with a Pap smear but no HPV testing, up to age 59. After age 26, you should have a Pap smear every 3 years with HPV testing, if no HPV was found previously.   Most routine colon cancer screening begins at the age of 64. On a yearly basis, doctors may provide special easy to use take-home tests to check for hidden blood in the stool. Sigmoidoscopy or colonoscopy can detect the earliest forms of colon cancer and is life saving. These tests use a small camera at the end of a tube to directly examine the colon. Speak to your caregiver about this at age 67, when routine screening begins (and is repeated every 5 years unless early forms of pre-cancerous polyps or small growths are found).

## 2017-11-28 ENCOUNTER — Other Ambulatory Visit: Payer: Self-pay | Admitting: Medical

## 2017-11-28 LAB — LIPID PANEL
CHOL/HDL RATIO: 2.3 ratio (ref 0.0–4.4)
Cholesterol, Total: 162 mg/dL (ref 100–199)
HDL: 70 mg/dL (ref >39)
LDL CALC: 75 mg/dL (ref 0–99)
Triglycerides: 86 mg/dL (ref 0–149)
VLDL Cholesterol Cal: 17 mg/dL (ref 5–40)

## 2017-11-28 LAB — COMPREHENSIVE METABOLIC PANEL
A/G RATIO: 1.3 (ref 1.2–2.2)
ALBUMIN: 4.4 g/dL (ref 3.5–5.5)
ALT: 17 IU/L (ref 0–32)
AST: 17 IU/L (ref 0–40)
Alkaline Phosphatase: 56 IU/L (ref 39–117)
BILIRUBIN TOTAL: 0.2 mg/dL (ref 0.0–1.2)
BUN / CREAT RATIO: 11 (ref 9–23)
BUN: 11 mg/dL (ref 6–20)
CHLORIDE: 101 mmol/L (ref 96–106)
CO2: 20 mmol/L (ref 20–29)
Calcium: 9.2 mg/dL (ref 8.7–10.2)
Creatinine, Ser: 0.99 mg/dL (ref 0.57–1.00)
GFR, EST AFRICAN AMERICAN: 85 mL/min/{1.73_m2} (ref >59)
GFR, EST NON AFRICAN AMERICAN: 74 mL/min/{1.73_m2} (ref >59)
GLOBULIN, TOTAL: 3.3 g/dL (ref 1.5–4.5)
Glucose: 93 mg/dL (ref 65–99)
POTASSIUM: 3.6 mmol/L (ref 3.5–5.2)
SODIUM: 140 mmol/L (ref 134–144)
TOTAL PROTEIN: 7.7 g/dL (ref 6.0–8.5)

## 2017-11-28 LAB — VITAMIN D 25 HYDROXY (VIT D DEFICIENCY, FRACTURES): VIT D 25 HYDROXY: 26.7 ng/mL — AB (ref 30.0–100.0)

## 2017-11-28 LAB — HEMOGLOBIN A1C
Est. average glucose Bld gHb Est-mCnc: 111 mg/dL
Hgb A1c MFr Bld: 5.5 % (ref 4.8–5.6)

## 2017-11-28 MED ORDER — VITAMIN D (ERGOCALCIFEROL) 1.25 MG (50000 UNIT) PO CAPS: 50000.0000 [IU] | ORAL_CAPSULE | ORAL | 0 refills | Status: DC

## 2017-11-28 MED FILL — VIT D2 1.25 MG (50,000 UNIT: 1.25 MG | 84 days supply | Qty: 12 | Fill #0

## 2017-11-29 LAB — CYTOLOGY - PAP: HPV: NOT DETECTED

## 2017-11-30 ENCOUNTER — Telehealth: Payer: Self-pay

## 2017-11-30 NOTE — Telephone Encounter (Signed)
Patient has an appointment on December 20, 2017 at 930 am at [facility] ob/[facility]. Left message on voicemail for patient to call back for appointment info.

## 2017-12-02 NOTE — Telephone Encounter (Signed)
Initiated P.A. CONTRAVE

## 2017-12-07 ENCOUNTER — Ambulatory Visit
Admission: RE | Admit: 2017-12-07 | Discharge: 2017-12-07 | Disposition: A | Discharge status: Home / Self Care | Payer: 59 | Source: Ambulatory Visit | Attending: Medical | Admitting: Medical

## 2017-12-07 DIAGNOSIS — Z0000 Encounter for general adult medical examination without abnormal findings: Secondary | ICD-10-CM

## 2017-12-07 DIAGNOSIS — E049 Nontoxic goiter, unspecified: Secondary | ICD-10-CM

## 2017-12-07 DIAGNOSIS — Z8639 Personal history of other endocrine, nutritional and metabolic disease: Secondary | ICD-10-CM | POA: Diagnosis not present

## 2017-12-07 MED FILL — CONTRAVE ER 8-90 MG TABLET: 8-90 | 30 days supply | Qty: 60 | Fill #0

## 2017-12-16 ENCOUNTER — Telehealth: Payer: Self-pay | Admitting: Medical

## 2017-12-16 NOTE — Telephone Encounter (Signed)
P.A. CONTRAVE approved til 04/04/18, pt informed

## 2017-12-20 DIAGNOSIS — R87612 Low grade squamous intraepithelial lesion on cytologic smear of cervix (LGSIL): Secondary | ICD-10-CM | POA: Diagnosis not present

## 2018-02-20 ENCOUNTER — Ambulatory Visit: Payer: 59 | Admitting: Family Medicine

## 2018-02-20 ENCOUNTER — Encounter: Payer: Self-pay | Admitting: Family Medicine

## 2018-02-20 VITALS — BP 110/70 | HR 57 | Temp 98.1°F | Resp 16 | Wt 206.8 lb

## 2018-02-20 DIAGNOSIS — K140 Glossitis: Secondary | ICD-10-CM | POA: Diagnosis not present

## 2018-02-20 DIAGNOSIS — J029 Acute pharyngitis, unspecified: Secondary | ICD-10-CM

## 2018-02-20 LAB — POCT RAPID STREP A (OFFICE): Rapid Strep A Screen: NEGATIVE

## 2018-02-20 MED ORDER — MAGIC MOUTHWASH W/LIDOCAINE: 5.0000 mL | Freq: Three times a day (TID) | ORAL | 0 refills | Status: DC

## 2018-02-20 MED FILL — MAGIC MOUTHWASH W/LIDO 1:1: 8 days supply | Qty: 120 | Fill #0

## 2018-02-20 NOTE — Progress Notes (Signed)
Chief Complaint  Patient presents with  . sore throat    throat is irriated and started today. tongue has bumps and stinging on both sides that started saturday    Subjective:  Jacqueline Winters is a 37 y.o. female who presents for 4 day history of sores on her tongue and pain. Today she developed a sore throat. No lip or tongue swelling. No difficulty swallowing.  History of shingles and strep.  Denies fever, chills, headache, vision changes, hoarseness, chest pain, palpitations, cough, shortness of breath, abdominal pain, N/V/D.   She stopped taking Micocycline one month ago. She has also taken Diflucan recently.   She smokes.   LMP: currently menstruating.   Treatment to date: Aleve.  Denies sick contacts.  No other aggravating or relieving factors.  No other c/o.  ROS as in subjective.   Objective: Vitals:   02/20/18 0911  BP: 110/70  Pulse: (!) 57  Resp: 16  Temp: 98.1 F (36.7 C)  SpO2: 98%    General appearance: Alert, WD/WN, no distress, mildly ill appearing                             Skin: warm, no rash                           Head: no sinus tenderness                            Eyes: conjunctiva normal, corneas clear, PERRLA                            Ears: pearly TMs, external ear canals normal                          Nose: septum midline, turbinates swollen, with erythema and no discharge             Mouth/throat: MMM, tongue with erythema, posterior and lateral inflamed papilla and ulcers to right lateral tongue, mild pharyngeal erythema, no edema or exudate.                            Neck: supple, no adenopathy, no thyromegaly, nontender                          Heart: RRR, normal S1, S2, no murmurs                         Lungs: CTA bilaterally, no wheezes, rales, or rhonchi      Assessment: Acute pharyngitis, unspecified etiology - Plan: POCT rapid strep A, magic mouthwash w/lidocaine SOLN, DISCONTINUED: magic mouthwash w/lidocaine SOLN  Tongue  inflammation - Plan: POCT rapid strep A, Herpes simplex virus culture, magic mouthwash w/lidocaine SOLN, DISCONTINUED: magic mouthwash w/lidocaine SOLN    Plan: Negative rapid strep. Suspect her symptoms are related to a viral illness and will treat her with supportive care for now. She may also follow up with her dentist if not improving.  Magic Mouthwash prescribed. She may also try salt water or hydrogen peroxide gargles, swish and spit.  Tylenol or Ibuprofen OTC for fever and malaise.  Call/return in 2-3 days if symptoms are worsening or  aren't resolving.

## 2018-02-23 LAB — HERPES SIMPLEX VIRUS CULTURE

## 2018-03-13 ENCOUNTER — Ambulatory Visit: Payer: 59 | Admitting: Medical

## 2018-03-13 VITALS — BP 120/80 | HR 83 | Wt 208.0 lb

## 2018-03-13 DIAGNOSIS — E669 Obesity, unspecified: Secondary | ICD-10-CM | POA: Diagnosis not present

## 2018-03-13 DIAGNOSIS — Z6833 Body mass index (BMI) 33.0-33.9, adult: Secondary | ICD-10-CM | POA: Diagnosis not present

## 2018-03-13 DIAGNOSIS — E559 Vitamin D deficiency, unspecified: Secondary | ICD-10-CM

## 2018-03-13 DIAGNOSIS — R5383 Other fatigue: Secondary | ICD-10-CM

## 2018-03-13 DIAGNOSIS — Z566 Other physical and mental strain related to work: Secondary | ICD-10-CM

## 2018-03-13 MED ORDER — VITAMIN D 25 MCG (1000 UNIT) PO TABS: 1000.0000 [IU] | ORAL_TABLET | Freq: Every day | ORAL | 3 refills | Status: DC

## 2018-03-13 MED ORDER — VITAMIN B-12 1000 MCG PO TABS: 1000.0000 ug | ORAL_TABLET | Freq: Every day | ORAL | 1 refills | Status: DC

## 2018-03-13 NOTE — Progress Notes (Signed)
Subjective: Chief Complaint  Patient presents with  . follow-up    3 month follow-up on weight loss and Vitamin D   Here today for 59-month follow-up.  This visit was supposed to be a follow-up about efforts at weight loss and vitamin D deficiency.  She did complete 3 months of weekly 50,000 unit vitamin D.  She does eat fish regularly.  Her main concern today is extreme fatigue as she is exhausted with everything going on.  She is working full-time, she is also going to school almost full-time, only getting about 4 hours of sleep per night.  She is dealing with a financial issue from where they bought a new house and there was a discrepancy with the survey causing her taxes to be higher with an extra $500 per month bill.  Her husband works in [employer] out of town 5 days a week with a lot of hours, leaving her to manage the kids and their schedule  She plans to graduate July 2020, so she still has several months ago.  She denies snoring, witnessed apnea, no concern for sleep apnea.  No new c/o.  Past Medical History:  Diagnosis Date  . Allergy   . GERD (gastroesophageal reflux disease)    with pregnancy  . Goiter   . Headache(784.0)   . Hidradenitis axillaris   . Impaired fasting blood sugar   . Insomnia   . Obesity   . PONV (postoperative nausea and vomiting)   . Thyroid nodule    biopsy 07/2013, consult with Dr. Iran Planas endocrinology  . Tobacco use disorder    Current Outpatient Medications on File Prior to Visit  Medication Sig Dispense Refill  . b complex vitamins capsule Take 1 capsule by mouth daily.    . benzoyl peroxide (BENZOYL PEROXIDE) 5 % external liquid Apply topically 2 (two) times daily. 142 g 12  . cetaphil (CETAPHIL) lotion Apply 1 application topically as needed for dry skin. 473 mL 2  . cetirizine (ZYRTEC) 10 MG tablet Take 1 tablet (10 mg total) by mouth daily. 30 tablet 11  . ibuprofen (ADVIL,MOTRIN) 200 MG tablet Take 400 mg by mouth every 6 (six) hours  as needed. Reported on 07/30/2015    . Multiple Vitamin (MULTIVITAMIN) tablet Take 1 tablet by mouth daily.    Marland Kitchen triamcinolone (NASACORT) 55 MCG/ACT AERO nasal inhaler Place 2 sprays into the nose daily. 1 Inhaler 12   No current facility-administered medications on file prior to visit.    ROS as in subjective    Objective BP 120/80   Pulse 83   Wt 208 lb (94.3 kg)   BMI 35.70 kg/m   Wt Readings from Last 3 Encounters:  03/13/18 208 lb (94.3 kg)  02/20/18 206 lb 12.8 oz (93.8 kg)  11/27/17 202 lb (91.6 kg)   General: Well-developed well-nourished no acute distress Seems fatigued and sleepy today, as she just got off third shift    Assessment: Encounter Diagnoses  Name Primary?  . Fatigue, unspecified type Yes  . Class 1 obesity with serious comorbidity and body mass index (BMI) of 33.0 to 33.9 in adult, unspecified obesity type   . Vitamin D deficiency   . Stress at work      Plan: I encouraged her to try to work on schedule to allow more sleep than 4 hours per night.  Counseled on going towards a plant-based diet, using supplements below, but currently unless she improves to sleep situation, she will continue to be  very exhausted.  I reviewed her labs from last visit mainly with low vitamin D being an issue.  I reviewed the thyroid ultrasound and thyroid labs regarding goiter that was stable.  I will plan on seeing her back yearly for physical, sooner as needed  Jacqueline Winters was seen today for follow-up.  Diagnoses and all orders for this visit:  Fatigue, unspecified type  Class 1 obesity with serious comorbidity and body mass index (BMI) of 33.0 to 33.9 in adult, unspecified obesity type  Vitamin D deficiency  Stress at work  Other orders -     cholecalciferol (VITAMIN D3) 25 MCG (1000 UT) tablet; Take 1 tablet (1,000 Units total) by mouth daily. -     vitamin B-12 (CYANOCOBALAMIN) 1000 MCG tablet; Take 1 tablet (1,000 mcg total) by mouth daily.

## 2018-03-13 NOTE — Patient Instructions (Addendum)
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 [place] [phone] (direct) [phone] (fax) [email or web address]    Recommendations Begin a daily vitamin B-12 supplement and vitamin D 1000 units daily for the foreseeable future I sent both of these to the pharmacy  Work with your husband and employer to lighten your schedule and get more sleep  I would like you to try the eating plan below:  Lets change strategies and try something that may work better than what you are currently doing  I want you to eat 3 meals a day +2 snacks, one midmorning snack and one mid afternoon snack  Breakfast You may eat 1 of the following  Smoothie with Almond milk, handful of kale or spinach, and 1-2 fruit servings of your choice such as berries or 1/2 banana  Whole grain slice of toast and thin layer of low sugar jam or small amount of honey  Whole grain slice of toast and avocado spread  1/2 cup of steel cut oats (oatmeal)   Mid-morning snack 1 fruit serving such as one of the following:  medium-sized apple  medium-sized orange,  Tangerine  1/2 banana   3/4 cup of fresh berries or frozen berries  A protein source such as one of the following:  8 almonds   small handful of walnuts or other nuts  Hummus and vegetable such as carrots   Lunch A protein source such as 1 of the following: . 1 serving of beans such as black beans, pinto beans, green beans, or edamame (soy beans) . Veggie burger  . Non breaded fish such as salmon or tuna, either baked, grilled, or broiled Vegetable - Half of your plate should be a non-starchy vegetables!  So avoid white potatoes and corn.  Otherwise, eat a large portion of vegetables. . Avocado, cucumber, tomato, carrots, greens, lettuce, squash, okra, etc.  . Vegetables can include salad with olive oil/vinaigrette dressing Grains such as 1/2 cup of brown rice, quinoa, barley or other whole grain or  1 or 2 slices of whole grain bread   Mid-afternoon snack 1 fruit serving such as one of the following:  medium-sized apple  medium-sized orange,  Tangerine  1/2 banana   3/4 cup of fresh berries or frozen berries  A protein source such as one of the following:  8 almonds   small handful of walnuts or other nuts  Hummus and vegetable such as carrots   Dinner A protein source such as 1 of the following: . 1 serving of beans such as black beans, pinto beans, green beans, or edamame (soy beans) . Veggie burger  . Non breaded fish such as salmon or tuna, either baked, grilled, or broiled Vegetable - Half of your plate should be a non-starchy vegetables!  So avoid white potatoes and corn.  Otherwise, eat a large portion of vegetables. . Avocado, cucumber, tomato, carrots, greens, lettuce, squash, okra, etc.  . Vegetables can include salad with olive oil/vinaigrette dressing Grains such as 1/2 cup of brown rice, quinoa, barley or other whole grain or 1 or 2 slices of whole grain bread   Beverages: Water Unsweet tea Home made juice with a juicer without sugar added other than small bit of honey or agave nectar Water with sugar free flavor such as Mio   AVOID.... For the time being I want you to cut out the following items completely: . Soda, sweet tea, juice, beer or wine or alcohol . Sweets such  as cake, candy, pies, chips, cookies, chocolate

## 2018-05-15 ENCOUNTER — Telehealth: Payer: Self-pay | Admitting: Medical

## 2018-05-15 NOTE — Telephone Encounter (Signed)
See her email.    She will either need to be seen for eval or referred to different gyn if insurance requires different provider than her current gyn

## 2018-05-16 NOTE — Telephone Encounter (Signed)
Patient called back states that she has been bleeding for 2 weeks and is having terrible pain.  She already has an appointment for tomorrow at 25.

## 2018-05-16 NOTE — Telephone Encounter (Signed)
Left message on voicemail for patient to call back regarding referral to Gyn.

## 2018-05-18 ENCOUNTER — Ambulatory Visit: Payer: Self-pay | Admitting: Medical

## 2018-05-18 ENCOUNTER — Ambulatory Visit (INDEPENDENT_AMBULATORY_CARE_PROVIDER_SITE_OTHER): Payer: No Typology Code available for payment source | Admitting: Medical

## 2018-05-18 ENCOUNTER — Encounter: Payer: Self-pay | Admitting: Medical

## 2018-05-18 VITALS — BP 122/88 | HR 72 | Temp 98.2°F | Wt 200.0 lb

## 2018-05-18 DIAGNOSIS — R1084 Generalized abdominal pain: Secondary | ICD-10-CM | POA: Diagnosis not present

## 2018-05-18 DIAGNOSIS — R102 Pelvic and perineal pain: Secondary | ICD-10-CM | POA: Diagnosis not present

## 2018-05-18 DIAGNOSIS — R1032 Left lower quadrant pain: Secondary | ICD-10-CM

## 2018-05-18 LAB — POCT URINALYSIS DIP (PROADVANTAGE DEVICE)
Bilirubin, UA: NEGATIVE
Blood, UA: NEGATIVE
Glucose, UA: NEGATIVE mg/dL
Ketones, POC UA: NEGATIVE mg/dL
Leukocytes, UA: NEGATIVE
NITRITE UA: NEGATIVE
PH UA: 6 (ref 5.0–8.0)
PROTEIN UA: NEGATIVE mg/dL
Specific Gravity, Urine: 1.01
Urobilinogen, Ur: 3.5

## 2018-05-18 LAB — POCT WET PREP (WET MOUNT)
Clue Cells Wet Prep Whiff POC: NEGATIVE
TRICHOMONAS WET PREP HPF POC: ABSENT

## 2018-05-18 NOTE — Progress Notes (Signed)
Subjective: Chief Complaint  Patient presents with  . left lower abdominal pain ongoing years gotten worse    just fininshed 2 week period   Here for lower abdominal pain for years intermittent .  Last 2 weeks intense pain LLQ.   Pain shoots down to suprapubic area.  Saw gyn about the same in past, had ultrasound last year of pelvis, saw scar tissue, but no cysts, no tumor.  Still has ovaries and uterus.   Has hx/o tubal ligation.   Has had hx/o abnormal pap.   No colposcopy done last gyn visit.    In general deals with the pain, uses ibuprofen.   This episode is the longest time she has had this intense of pain.   Pain doesn't matter if standing, siting or lying.  Has daily normal BM, no diarrhea, no constipation.  Sometimes bloated with periods, but no bowel c/o.  Currently no urinary issue, no urinary frequency, no urgency, no burning with urination.  No fever, no sweats, no body aches, no chills.  LMP stopped 3 days ago, but bled for 2 weeks, heavy, lots of small clots.   No vaginal discharge.     Periods have been mostly regularly, not particularly heavy until this one.  Past Medical History:  Diagnosis Date  . Allergy   . GERD (gastroesophageal reflux disease)    with pregnancy  . Goiter   . Headache(784.0)   . Hidradenitis axillaris   . Impaired fasting blood sugar   . Insomnia   . Obesity   . PONV (postoperative nausea and vomiting)   . Thyroid nodule    biopsy 07/2013, consult with Dr. Iran Planas endocrinology  . Tobacco use disorder    Current Outpatient Medications on File Prior to Visit  Medication Sig Dispense Refill  . b complex vitamins capsule Take 1 capsule by mouth daily.    . benzoyl peroxide (BENZOYL PEROXIDE) 5 % external liquid Apply topically 2 (two) times daily. 142 g 12  . cetaphil (CETAPHIL) lotion Apply 1 application topically as needed for dry skin. 473 mL 2  . cetirizine (ZYRTEC) 10 MG tablet Take 1 tablet (10 mg total) by mouth daily. 30 tablet 11  .  ibuprofen (ADVIL,MOTRIN) 200 MG tablet Take 400 mg by mouth every 6 (six) hours as needed. Reported on 07/30/2015    . Multiple Vitamin (MULTIVITAMIN) tablet Take 1 tablet by mouth daily.    Marland Kitchen triamcinolone (NASACORT) 55 MCG/ACT AERO nasal inhaler Place 2 sprays into the nose daily. 1 Inhaler 12  . vitamin B-12 (CYANOCOBALAMIN) 1000 MCG tablet Take 1 tablet (1,000 mcg total) by mouth daily. 90 tablet 1  . cholecalciferol (VITAMIN D3) 25 MCG (1000 UT) tablet Take 1 tablet (1,000 Units total) by mouth daily. (Patient not taking: Reported on 05/18/2018) 90 tablet 3   No current facility-administered medications on file prior to visit.    ROS as in subjective   Objective: BP 122/88 (BP Location: Right Arm, Patient Position: Sitting, Cuff Size: Normal)   Pulse 72   Temp 98.2 F (36.8 C) (Oral)   Wt 200 lb (90.7 kg)   SpO2 98%   BMI 34.33 kg/m   Wt Readings from Last 3 Encounters:  05/18/18 200 lb (90.7 kg)  03/13/18 208 lb (94.3 kg)  02/20/18 206 lb 12.8 oz (93.8 kg)   General appearance: alert, no distress, WD/WN, AA female Abdomen: +bs, soft, quite tender LLQ and somewhat suprapubic region, otherwise non tender, non distended, no masses, no hepatomegaly,  no splenomegaly Pulses: 2+ symmetric, upper and lower extremities, normal cap refill Gyn: Normal external genitalia without lesions, vagina with normal mucosa, cervix without lesions, no cervical motion tenderness, no abnormal vaginal discharge.  Uterus and adnexa not enlarged, tender left adenxa, otherwise non tender, no masses.  Exam chaperoned by nurse.   Assessment: Encounter Diagnoses  Name Primary?  . Pelvic pain Yes  . LLQ abdominal pain   . Generalized abdominal pain     Plan: I reviewed her 2018 in 2019 gynecology notes as well as the 11/2016 pelvic ultrasound per gynecology visit notes was normal  There may be some scar tissue, this could be the cause of her pain, but I wonder about infection or cyst or other or bowel  issue.  She is quite tender today.  Labs below, wet prep reviewed and will likely set up for CT scan pending labs  Advise over-the-counter ibuprofen 3 tablets or 600 mg 3 times a day this weekend, rest, hydrate well, stool softener as needed and we will call back with labs and plan for CT  Otila was seen today for left lower abdominal pain ongoing years gotten worse.  Diagnoses and all orders for this visit:  Pelvic pain -     CBC with Differential/Platelet -     Basic metabolic panel -     POCT Wet Prep ([place] [place])  LLQ abdominal pain -     CBC with Differential/Platelet -     Basic metabolic panel -     POCT Wet Prep [facility])  Generalized abdominal pain -     CT Abdomen Pelvis W Contrast; Future -     CBC with Differential/Platelet -     Basic metabolic panel -     POCT Wet Prep [facility])

## 2018-05-19 LAB — CBC WITH DIFFERENTIAL/PLATELET
Basophils Absolute: 0 10*3/uL (ref 0.0–0.2)
Basos: 1 %
EOS (ABSOLUTE): 0.1 10*3/uL (ref 0.0–0.4)
Eos: 1 %
HEMOGLOBIN: 12.1 g/dL (ref 11.1–15.9)
Hematocrit: 36.8 % (ref 34.0–46.6)
Immature Grans (Abs): 0 10*3/uL (ref 0.0–0.1)
Immature Granulocytes: 0 %
LYMPHS ABS: 2.3 10*3/uL (ref 0.7–3.1)
Lymphs: 27 %
MCH: 28.5 pg (ref 26.6–33.0)
MCHC: 32.9 g/dL (ref 31.5–35.7)
MCV: 87 fL (ref 79–97)
Monocytes Absolute: 0.5 10*3/uL (ref 0.1–0.9)
Monocytes: 6 %
Neutrophils Absolute: 5.4 10*3/uL (ref 1.4–7.0)
Neutrophils: 65 %
Platelets: 384 10*3/uL (ref 150–450)
RBC: 4.25 x10E6/uL (ref 3.77–5.28)
RDW: 13.5 % (ref 11.7–15.4)
WBC: 8.3 10*3/uL (ref 3.4–10.8)

## 2018-05-19 LAB — BASIC METABOLIC PANEL
BUN / CREAT RATIO: 12 (ref 9–23)
BUN: 9 mg/dL (ref 6–20)
CHLORIDE: 103 mmol/L (ref 96–106)
CO2: 23 mmol/L (ref 20–29)
Calcium: 9.1 mg/dL (ref 8.7–10.2)
Creatinine, Ser: 0.77 mg/dL (ref 0.57–1.00)
GFR calc Af Amer: 114 mL/min/{1.73_m2} (ref >59)
GFR calc non Af Amer: 99 mL/min/{1.73_m2} (ref >59)
Glucose: 76 mg/dL (ref 65–99)
Potassium: 4 mmol/L (ref 3.5–5.2)
Sodium: 141 mmol/L (ref 134–144)

## 2018-05-23 ENCOUNTER — Telehealth: Payer: Self-pay

## 2018-05-23 NOTE — Telephone Encounter (Signed)
Called patient and told her we needed to schedule appointment to complete paper work.  She states that employee health is going to send her copies of vaccine records that they had.   Please advise if she still needs to come in for a visit.

## 2018-05-23 NOTE — Telephone Encounter (Signed)
One of the test required was tuberculosis screen.  Has she had one recently we can get copy of result?

## 2018-05-23 NOTE — Telephone Encounter (Signed)
I am not sure she is having Employee health send her anything that she has had done.

## 2018-05-24 NOTE — Telephone Encounter (Signed)
Let me see the form again

## 2018-05-25 NOTE — Telephone Encounter (Signed)
Form given to you in purple folder.

## 2018-05-29 ENCOUNTER — Other Ambulatory Visit: Payer: No Typology Code available for payment source

## 2018-05-29 ENCOUNTER — Other Ambulatory Visit: Payer: Self-pay

## 2018-05-29 DIAGNOSIS — Z021 Encounter for pre-employment examination: Secondary | ICD-10-CM

## 2018-05-29 DIAGNOSIS — Z139 Encounter for screening, unspecified: Secondary | ICD-10-CM

## 2018-05-29 DIAGNOSIS — Z0100 Encounter for examination of eyes and vision without abnormal findings: Secondary | ICD-10-CM

## 2018-05-30 LAB — VARICELLA ZOSTER ANTIBODY, IGG: Varicella zoster IgG: 1978 index (ref >165)

## 2018-05-30 LAB — MEASLES/MUMPS/RUBELLA IMMUNITY
MUMPS ABS, IGG: 264 [AU]/ml
RUBEOLA AB, IGG: 263 [AU]/ml
Rubella Antibodies, IGG: 9.8 {index}

## 2018-06-12 ENCOUNTER — Ambulatory Visit
Admission: RE | Admit: 2018-06-12 | Discharge: 2018-06-12 | Disposition: A | Discharge status: Home / Self Care | Payer: No Typology Code available for payment source | Source: Ambulatory Visit | Attending: Medical | Admitting: Medical

## 2018-06-12 DIAGNOSIS — R1084 Generalized abdominal pain: Secondary | ICD-10-CM

## 2018-06-12 MED ORDER — IOPAMIDOL (ISOVUE-300) INJECTION 61%
100.0000 mL | Freq: Once | INTRAVENOUS | Status: AC | PRN
  Administered 2018-06-12: 100 mL via INTRAVENOUS

## 2018-07-10 ENCOUNTER — Other Ambulatory Visit: Payer: Self-pay

## 2018-07-10 ENCOUNTER — Ambulatory Visit (INDEPENDENT_AMBULATORY_CARE_PROVIDER_SITE_OTHER): Payer: No Typology Code available for payment source | Admitting: Family Medicine

## 2018-07-10 ENCOUNTER — Telehealth: Payer: Self-pay | Admitting: Medical

## 2018-07-10 ENCOUNTER — Encounter: Payer: Self-pay | Admitting: Family Medicine

## 2018-07-10 VITALS — Temp 97.7°F | Wt 202.0 lb

## 2018-07-10 DIAGNOSIS — R0981 Nasal congestion: Secondary | ICD-10-CM | POA: Diagnosis not present

## 2018-07-10 DIAGNOSIS — J302 Other seasonal allergic rhinitis: Secondary | ICD-10-CM | POA: Diagnosis not present

## 2018-07-10 DIAGNOSIS — R52 Pain, unspecified: Secondary | ICD-10-CM

## 2018-07-10 NOTE — Progress Notes (Signed)
   Subjective:    Patient ID: Jacqueline Winters, female    DOB: 11-13-1980, 38 y.o.   MRN: [id number]  Documentation for virtual audio and video telecommunications through [place] encounter:  The provider was located in the office.  Husband and daughter are in the room with patient.  The patient did consent to this visit and is aware of possible charges through their insurance for this visit.  This virtual service is not related to other E/M service within previous 7 days.   HPI Chief Complaint  Patient presents with  . sick    body aches, no fever, stuffiness in nose, nasal drip. started with nasal drip off and on, body achy within 2 days.  worked with patient with positive mrsa and had worked with her prior to on sunday with no ppe   Complains of an acute onset of generalized body aches that started last night after working 3 12-hour shifts in a row.  She also reports having some nasal congestion and rhinorrhea. No fever, chills, dizziness, ear pain, sore throat, cough, shortness of breath, abdominal pain, N/V/D.   Taking ibuprofen twice daily.  Warm tea and lemon. Multi-vitamins  Saline nasal rinse.  Treating underlying allergies with Zyrtec and Nasacort.   States she did some push ups a couple of days ago and thinks this may be contributing to upper body pain.    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  Smoker some days.   Reviewed allergies, medications, past medical, surgical, family, and social history.    Review of Systems Pertinent positives and negatives in the history of present illness.      Objective:   Physical Exam Temp 97.7 F (36.5 C) (Oral)   Wt 202 lb (91.6 kg)   BMI 34.67 kg/m   Alert and oriented and in no acute distress.  Speaking in complete sentences without any difficulty.  No cough.  Normal speech, mood and thought process.       Assessment & Plan:  Generalized body aches  Nasal  congestion  Seasonal allergies  Discussed that she has no red flag symptoms or any obvious infectious process. Recommend that she continue with symptomatic treatment including Tylenol or ibuprofen, saline nasal rinse, Zyrtec and Nasacort for allergies.  She will stay well-hydrated.  Advised her to rest today if she can.  She will call back if symptoms are worsening. This visit was conducted via [employer] and video.  She is aware that she will be billed for this visit.

## 2018-07-10 NOTE — Telephone Encounter (Signed)
Pt is going to see Jacqueline Winters this afternoon

## 2018-07-10 NOTE — Telephone Encounter (Signed)
Call and see if she wants to do a virtual visit tomorrow with me or with someone here today?  Please help her know that the only way we can test for coronavirus is for a patient to go to the triage tent at the hospital which is mainly for people that are so slightly need to potentially be hospitalized  Otherwise we are handling all these things through conversations virtually to decide what needs to be done

## 2018-07-18 ENCOUNTER — Other Ambulatory Visit: Payer: Self-pay | Admitting: Medical

## 2018-07-18 MED ORDER — IBUPROFEN 600 MG PO TABS: 600.0000 mg | ORAL_TABLET | Freq: Four times a day (QID) | ORAL | 0 refills | Status: DC | PRN

## 2018-07-18 MED ORDER — GUAIFENESIN ER 600 MG PO TB12: 600.0000 mg | ORAL_TABLET | Freq: Two times a day (BID) | ORAL | 1 refills | Status: AC

## 2018-07-18 MED ORDER — VITAMIN D 25 MCG (1000 UNIT) PO TABS: 1000.0000 [IU] | ORAL_TABLET | Freq: Every day | ORAL | 3 refills | Status: DC

## 2018-07-18 MED ORDER — GUAIFENESIN ER 600 MG PO TB12: 600.0000 mg | ORAL_TABLET | Freq: Two times a day (BID) | ORAL | 1 refills | Status: DC

## 2018-08-15 ENCOUNTER — Other Ambulatory Visit: Payer: Self-pay | Admitting: Medical

## 2018-08-15 DIAGNOSIS — J301 Allergic rhinitis due to pollen: Secondary | ICD-10-CM

## 2018-08-15 MED ORDER — CETIRIZINE HCL 10 MG PO TABS: 10.0000 mg | ORAL_TABLET | Freq: Every day | ORAL | 11 refills | Status: DC

## 2018-11-30 ENCOUNTER — Encounter: Payer: 59 | Admitting: Medical

## 2019-03-22 ENCOUNTER — Ambulatory Visit (INDEPENDENT_AMBULATORY_CARE_PROVIDER_SITE_OTHER): Payer: No Typology Code available for payment source | Admitting: Medical

## 2019-03-22 ENCOUNTER — Other Ambulatory Visit: Payer: Self-pay

## 2019-03-22 VITALS — Temp 97.3°F | Ht 64.0 in | Wt 200.0 lb

## 2019-03-22 DIAGNOSIS — L732 Hidradenitis suppurativa: Secondary | ICD-10-CM

## 2019-03-22 MED ORDER — TRAMADOL HCL 50 MG PO TABS: 50.0000 mg | ORAL_TABLET | Freq: Three times a day (TID) | ORAL | 0 refills | Status: DC | PRN

## 2019-03-22 MED ORDER — CLINDAMYCIN PHOS-BENZOYL PEROX 1-5 % EX GEL: Freq: Two times a day (BID) | CUTANEOUS | 1 refills | Status: DC

## 2019-03-22 MED ORDER — DOXYCYCLINE HYCLATE 100 MG PO TABS: 100.0000 mg | ORAL_TABLET | Freq: Two times a day (BID) | ORAL | 1 refills | Status: DC

## 2019-03-22 NOTE — Progress Notes (Signed)
This visit type was conducted due to national recommendations for restrictions regarding the COVID-19 Pandemic (e.g. social distancing) in an effort to limit this patient's exposure and mitigate transmission in our community.  Due to their co-morbid illnesses, this patient is at least at moderate risk for complications without adequate follow up.  This format is felt to be most appropriate for this patient at this time.    Documentation for virtual audio and video telecommunications through Zoom encounter:  The patient was located at home. The provider was located in the office. The patient did consent to this visit and is aware of possible charges through their insurance for this visit.  The other persons participating in this telemedicine service were none. Time spent on call was 15 minutes and in review of previous records >15 minutes total.  This virtual service is not related to other E/M service within previous 7 days.   Subjective: Chief Complaint  Patient presents with  . Mass    underneath right armpit    She denies a flareup of lesion in the right armpit draining pus.  She has a history of hidradenitis for several years in the right armpit alone.  She has never had a problem in the left armpit.  She has scarring from prior lesions.  She gets flareups from time to time.  In the past year she has had at least 2 bad flareups.  She has been doing okay then all of a sudden last night about 3 AM she woke up with a swollen sore area under the right arm that was draining pus.  Back in 2017 we had used topical ointment and antibiotic which she would like a refill on today. No fever no body aches no chills she was using a natural deodorant without paraffin's but recently had switched back to a different roll on deodorant about a month ago which may have been a factor in this current flareup.  No other aggravating or relieving factors. No other complaint.  Past Medical History:  Diagnosis Date   . Allergy   . GERD (gastroesophageal reflux disease)    with pregnancy  . Goiter   . Headache(784.0)   . Hidradenitis axillaris   . Impaired fasting blood sugar   . Insomnia   . Obesity   . PONV (postoperative nausea and vomiting)   . Thyroid nodule    biopsy 07/2013, consult with Dr. Iran Planas endocrinology  . Tobacco use disorder    Current Outpatient Medications on File Prior to Visit  Medication Sig Dispense Refill  . Ascorbic Acid (VITAMIN C) 1000 MG tablet Take 1,000 mg by mouth daily.    Marland Kitchen b complex vitamins capsule Take 1 capsule by mouth daily.    . cetaphil (CETAPHIL) lotion Apply 1 application topically as needed for dry skin. 473 mL 2  . cetirizine (ZYRTEC) 10 MG tablet Take 1 tablet (10 mg total) by mouth daily. 30 tablet 11  . cholecalciferol (VITAMIN D3) 25 MCG (1000 UT) tablet Take 1 tablet (1,000 Units total) by mouth daily. 90 tablet 3  . guaiFENesin (MUCINEX) 600 MG 12 hr tablet Take 1 tablet (600 mg total) by mouth 2 (two) times daily. 20 tablet 1  . ibuprofen (ADVIL,MOTRIN) 600 MG tablet Take 1 tablet (600 mg total) by mouth every 6 (six) hours as needed. 30 tablet 0  . Multiple Vitamin (MULTIVITAMIN) tablet Take 1 tablet by mouth daily.    . vitamin B-12 (CYANOCOBALAMIN) 1000 MCG tablet Take 1 tablet (  1,000 mcg total) by mouth daily. 90 tablet 1  . triamcinolone (NASACORT) 55 MCG/ACT AERO nasal inhaler Place 2 sprays into the nose daily. (Patient not taking: Reported on 03/22/2019) 1 Inhaler 12   No current facility-administered medications on file prior to visit.   ROS as in subjective   Objective: Temp (!) 97.3 F (36.3 C)   Ht 5\' 4"  (1.626 m)   Wt 200 lb (90.7 kg)   BMI 34.33 kg/m   General well-developed well-nourished no acute distress Right axilla with large swollen fluctuant subcutaneous mass.  She squeezed the outside of the mass and I can visually see pus coming out the superior portion of the lesion through an opening.  She seems to be in  pain pressing on the lesion    Assessment: Encounter Diagnosis  Name Primary?  . Hidradenitis axillaris Yes     Plan: Begin medication as below, refer to plastic surgery for further options for more definitive treatment given the long-term nature of recurrence of this.   Gauri was seen today for mass.  Diagnoses and all orders for this visit:  Hidradenitis axillaris -     Ambulatory referral to Plastic Surgery  Other orders -     clindamycin-benzoyl peroxide (BENZACLIN) gel; Apply topically 2 (two) times daily. -     doxycycline (VIBRA-TABS) 100 MG tablet; Take 1 tablet (100 mg total) by mouth 2 (two) times daily. -     traMADol (ULTRAM) 50 MG tablet; Take 1 tablet (50 mg total) by mouth every 8 (eight) hours as needed.

## 2019-04-24 ENCOUNTER — Other Ambulatory Visit: Payer: Self-pay

## 2019-04-24 ENCOUNTER — Ambulatory Visit (INDEPENDENT_AMBULATORY_CARE_PROVIDER_SITE_OTHER): Payer: 59 | Admitting: Medical

## 2019-04-24 ENCOUNTER — Encounter: Payer: Self-pay | Admitting: Medical

## 2019-04-24 VITALS — BP 110/70 | HR 87 | Temp 98.4°F | Ht 64.0 in | Wt 204.4 lb

## 2019-04-24 DIAGNOSIS — M7062 Trochanteric bursitis, left hip: Secondary | ICD-10-CM | POA: Diagnosis not present

## 2019-04-24 DIAGNOSIS — Z01818 Encounter for other preprocedural examination: Secondary | ICD-10-CM

## 2019-04-24 DIAGNOSIS — Z720 Tobacco use: Secondary | ICD-10-CM | POA: Diagnosis not present

## 2019-04-24 MED ORDER — NAPROXEN 500 MG PO TABS: 500.0000 mg | ORAL_TABLET | Freq: Two times a day (BID) | ORAL | 0 refills | Status: DC

## 2019-04-24 NOTE — Progress Notes (Signed)
Subjective:  Jacqueline Winters is a 39 y.o. female who presents for Chief Complaint  Patient presents with  . Hip Pain    left     Here today for 2 issues.  She notes pain over the last upper thigh.  She had hip bursitis years ago.  She was seen here for this back in May be 2009 and Naprosyn help make it go away.  She has a flareup of it again.  The left hip has been hurting for the last week or so.  She walks a lot at work throughout the day.  She does use some stairs.  Has been exercising.  It hurts to lie on the left side.  She denies history of hip arthritis.  No recent injury or trauma.  No leg numbness or tingling.  No leg swelling.  She plans to have plastic surgery February 16 for a lower tummy tuck.  This will be IV sedation/conscious sedation.  Her sister just had surgery at the same facility and did well without any problems.  Lately she has been feeling fine otherwise.  No chest pain no shortness of breath no swelling, no bleeding, no other concerns.  She still smokes maybe 4 cigarettes/day.  No other aggravating or relieving factors.    No other c/o.  The following portions of the patient's history were reviewed and updated as appropriate: allergies, current medications, past family history, past medical history, past social history, past surgical history and problem list.  ROS Otherwise as in subjective above  Past Medical History:  Diagnosis Date  . Allergy   . GERD (gastroesophageal reflux disease)    with pregnancy  . Goiter   . Headache(784.0)   . Hidradenitis axillaris   . Impaired fasting blood sugar   . Insomnia   . Obesity   . PONV (postoperative nausea and vomiting)   . Thyroid nodule    biopsy 07/2013, consult with Dr. Iran Planas endocrinology  . Tobacco use disorder    Current Outpatient Medications on File Prior to Visit  Medication Sig Dispense Refill  . Ascorbic Acid (VITAMIN C) 1000 MG tablet Take 1,000 mg by mouth daily.    Marland Kitchen b complex  vitamins capsule Take 1 capsule by mouth daily.    . cetaphil (CETAPHIL) lotion Apply 1 application topically as needed for dry skin. 473 mL 2  . cetirizine (ZYRTEC) 10 MG tablet Take 1 tablet (10 mg total) by mouth daily. 30 tablet 11  . cholecalciferol (VITAMIN D3) 25 MCG (1000 UT) tablet Take 1 tablet (1,000 Units total) by mouth daily. 90 tablet 3  . guaiFENesin (MUCINEX) 600 MG 12 hr tablet Take 1 tablet (600 mg total) by mouth 2 (two) times daily. 20 tablet 1  . ibuprofen (ADVIL,MOTRIN) 600 MG tablet Take 1 tablet (600 mg total) by mouth every 6 (six) hours as needed. 30 tablet 0  . Multiple Vitamin (MULTIVITAMIN) tablet Take 1 tablet by mouth daily.    . traMADol (ULTRAM) 50 MG tablet Take 1 tablet (50 mg total) by mouth every 8 (eight) hours as needed. 20 tablet 0  . vitamin B-12 (CYANOCOBALAMIN) 1000 MCG tablet Take 1 tablet (1,000 mcg total) by mouth daily. 90 tablet 1  . clindamycin-benzoyl peroxide (BENZACLIN) gel Apply topically 2 (two) times daily. (Patient not taking: Reported on 04/24/2019) 25 g 1  . doxycycline (VIBRA-TABS) 100 MG tablet Take 1 tablet (100 mg total) by mouth 2 (two) times daily. (Patient not taking: Reported on 04/24/2019) 20  tablet 1  . triamcinolone (NASACORT) 55 MCG/ACT AERO nasal inhaler Place 2 sprays into the nose daily. (Patient not taking: Reported on 03/22/2019) 1 Inhaler 12   No current facility-administered medications on file prior to visit.     Objective: BP 110/70   Pulse 87   Temp 98.4 F (36.9 C)   Ht 5\' 4"  (1.626 m)   Wt 204 lb 6.4 oz (92.7 kg)   SpO2 98%   BMI 35.09 kg/m   General appearance: alert, no distress, well developed, well nourished Neck: supple, no lymphadenopathy, no thyromegaly, no masses Heart: RRR, normal S1, S2, no murmurs Lungs: CTA bilaterally, no wheezes, rhonchi, or rales Abdomen: +bs, soft, non tender, non distended, no masses, no hepatomegaly, no splenomegaly Pulses: 2+ radial pulses, 2+ pedal pulses, normal cap  refill Ext: no edema Tender over the left trochanter bursa, otherwise no other hip tenderness, mainly has point tenderness over the bursa with leg range of motion but it range of motion seems to be okay.  Rest of leg nontender. Legs neurovascularly intact   EKG Indication preop, rate 67 bpm, PR 138 ms, QRS 74 ms, QTC 424 ms, axis 31 degrees.  Normal sinus rhythm with sinus arrhythmia.  No acute changes   Assessment: Encounter Diagnoses  Name Primary?  . Preop examination Yes  . Trochanteric bursitis of left hip   . Tobacco use      Plan: Preop-I reviewed her health history, EKG reviewed.  She will return for labs below within the next 2 weeks.  Trochanteric bursitis-begin Naprosyn below, relative rest, can use ice therapy 20 minutes at a time, stretching, but avoid excessive walking and stair use.  If not much improved within the next 10 days we can consider steroid injection.  If the hip continues to give her problems weeks now then follow-up.  Denyla was seen today for hip pain.  Diagnoses and all orders for this visit:  Preop examination -     Basic Metabolic Panel; Future -     CBC; Future -     EKG 12-Lead  Trochanteric bursitis of left hip  Tobacco use -     Basic Metabolic Panel; Future -     CBC; Future -     EKG 12-Lead  Other orders -     naproxen (NAPROSYN) 500 MG tablet; Take 1 tablet (500 mg total) by mouth 2 (two) times daily with a meal.    Follow up: Within the next 10 days for labs so it will be within 30 days of her surgery.

## 2019-04-30 ENCOUNTER — Other Ambulatory Visit: Payer: Self-pay

## 2019-04-30 ENCOUNTER — Other Ambulatory Visit: Payer: 59

## 2019-04-30 DIAGNOSIS — Z720 Tobacco use: Secondary | ICD-10-CM | POA: Diagnosis not present

## 2019-04-30 DIAGNOSIS — Z01818 Encounter for other preprocedural examination: Secondary | ICD-10-CM

## 2019-04-30 LAB — CBC
Hematocrit: 37.5 % (ref 34.0–46.6)
Hemoglobin: 12.7 g/dL (ref 11.1–15.9)
MCH: 29.2 pg (ref 26.6–33.0)
MCHC: 33.9 g/dL (ref 31.5–35.7)
MCV: 86 fL (ref 79–97)
Platelets: 311 10*3/uL (ref 150–450)
RBC: 4.35 x10E6/uL (ref 3.77–5.28)
RDW: 12.7 % (ref 11.7–15.4)
WBC: 8 10*3/uL (ref 3.4–10.8)

## 2019-04-30 LAB — BASIC METABOLIC PANEL
BUN/Creatinine Ratio: 16 (ref 9–23)
BUN: 13 mg/dL (ref 6–20)
CO2: 24 mmol/L (ref 20–29)
Calcium: 9.4 mg/dL (ref 8.7–10.2)
Chloride: 104 mmol/L (ref 96–106)
Creatinine, Ser: 0.82 mg/dL (ref 0.57–1.00)
GFR calc Af Amer: 105 mL/min/{1.73_m2} (ref >59)
GFR calc non Af Amer: 91 mL/min/{1.73_m2} (ref >59)
Glucose: 95 mg/dL (ref 65–99)
Potassium: 4.6 mmol/L (ref 3.5–5.2)
Sodium: 141 mmol/L (ref 134–144)

## 2019-06-07 ENCOUNTER — Other Ambulatory Visit: Payer: Self-pay | Admitting: Medical

## 2019-06-07 NOTE — Telephone Encounter (Signed)
[facility] is requesting to fill pt clidamycin- benzoyl. Please advise [facility]

## 2019-07-05 ENCOUNTER — Telehealth: Payer: Self-pay | Admitting: Medical

## 2019-07-05 ENCOUNTER — Other Ambulatory Visit: Payer: Self-pay | Admitting: Medical

## 2019-07-05 MED ORDER — DOXYCYCLINE HYCLATE 100 MG PO TABS: 100.0000 mg | ORAL_TABLET | Freq: Every day | ORAL | 1 refills | Status: DC

## 2019-07-05 MED FILL — DOXYCYCLINE HYCLATE 100 MG: 100 | 30 days supply | Qty: 30 | Fill #0

## 2019-07-05 NOTE — Telephone Encounter (Signed)
Done

## 2019-07-05 NOTE — Telephone Encounter (Signed)
Please refer to plastic surgery for chronic hidradenitis axillaris.  Try Dr. Claudia Desanctis with [facility] but verify they actually take care of this issue.  If not then refer to Dr. Towanda Malkin

## 2019-07-18 MED FILL — IBUPROFEN 400 MG TABS: 400 | 8 days supply | Qty: 30 | Fill #0

## 2019-07-19 ENCOUNTER — Other Ambulatory Visit: Payer: Self-pay | Admitting: Medical

## 2019-07-19 MED ORDER — FLUCONAZOLE 150 MG PO TABS: ORAL_TABLET | ORAL | 0 refills | Status: DC

## 2019-07-19 MED FILL — FLUCONAZOLE 150 MG TABLET: 150 | 7 days supply | Qty: 2 | Fill #0

## 2019-09-28 ENCOUNTER — Other Ambulatory Visit: Payer: Self-pay

## 2019-09-28 ENCOUNTER — Other Ambulatory Visit: Payer: Self-pay | Admitting: Medical

## 2019-09-30 MED ORDER — IBUPROFEN 600 MG PO TABS: 600.0000 mg | ORAL_TABLET | Freq: Four times a day (QID) | ORAL | 0 refills | Status: DC | PRN

## 2019-10-01 ENCOUNTER — Telehealth: Payer: 59 | Admitting: Medical

## 2019-10-01 ENCOUNTER — Encounter: Payer: Self-pay | Admitting: Medical

## 2019-10-01 ENCOUNTER — Other Ambulatory Visit: Payer: Self-pay

## 2019-10-01 VITALS — Ht 64.5 in | Wt 211.0 lb

## 2019-10-01 DIAGNOSIS — L732 Hidradenitis suppurativa: Secondary | ICD-10-CM | POA: Diagnosis not present

## 2019-10-01 DIAGNOSIS — J988 Other specified respiratory disorders: Secondary | ICD-10-CM | POA: Diagnosis not present

## 2019-10-01 MED ORDER — AMOXICILLIN-POT CLAVULANATE 875-125 MG PO TABS: 1.0000 | ORAL_TABLET | Freq: Two times a day (BID) | ORAL | 0 refills | Status: DC

## 2019-10-01 MED FILL — AMOX-CLAV 875-125 MG TABLET: 875-125 | 10 days supply | Qty: 20 | Fill #0

## 2019-10-01 NOTE — Progress Notes (Signed)
Subjective:     Patient ID: [name], female   DOB: March 28, 1981, 39 y.o.   MRN: [id number]  This visit type was conducted due to national recommendations for restrictions regarding the COVID-19 Pandemic (e.g. social distancing) in an effort to limit this patient's exposure and mitigate transmission in our community.  Due to their co-morbid illnesses, this patient is at least at moderate risk for complications without adequate follow up.  This format is felt to be most appropriate for this patient at this time.    Documentation for virtual audio and video telecommunications through Zoom encounter:  The patient was located at home. The provider was located in the office. The patient did consent to this visit and is aware of possible charges through their insurance for this visit.  The other persons participating in this telemedicine service were none. Time spent on call was 20 minutes and in review of previous records 20 minutes total.  This virtual service is not related to other E/M service within previous 7 days.   HPI Chief Complaint  Patient presents with  . Sinus Problem    post nasal drip, ear irritation   . Abdominal Pain    where she had her c-section-taking ibuprofen   . Recurrent Skin Infections    right armpit    Virtual consult for 2 issues today  She notes about a week of sinus pressure, postnasal drainage, ear pressure, fatigue, but no fever,no vomiting, no wheezing or shortness of breath.  Some cough.  She has had nausea as well.  Using Zyrtec and ibuprofen but no improvements.  She did get a Covid test a few days ago that was negative.  Her daughter has been sick with similar symptoms.  She also has ongoing problems with the right armpit boils.  She has some thickened skin.  She would like to see a specialist about this as it is not improving with oral doxycycline and topical Benzagel  She recently had plastic surgery/cosmetic surgery unrelated.  She is doing  okay in this regard.  This was done in [place]  Past Medical History:  Diagnosis Date  . Allergy   . GERD (gastroesophageal reflux disease)    with pregnancy  . Goiter   . Headache(784.0)   . Hidradenitis axillaris   . Impaired fasting blood sugar   . Insomnia   . Obesity   . PONV (postoperative nausea and vomiting)   . Thyroid nodule    biopsy 07/2013, consult with Dr. Iran Planas endocrinology  . Tobacco use disorder    Current Outpatient Medications on File Prior to Visit  Medication Sig Dispense Refill  . b complex vitamins capsule Take 1 capsule by mouth daily.    . cetirizine (ZYRTEC) 10 MG tablet Take 1 tablet (10 mg total) by mouth daily. 30 tablet 11  . cholecalciferol (VITAMIN D) 25 MCG (1000 UNIT) tablet TAKE 1 TABLET BY MOUTH EVERY DAY 90 tablet 3  . clindamycin-benzoyl peroxide (BENZACLIN) gel APPLY TO AFFECTED AREA TWICE A DAY 25 g 1  . doxycycline (VIBRA-TABS) 100 MG tablet Take 1 tablet (100 mg total) by mouth daily. 30 tablet 1  . ibuprofen (ADVIL) 600 MG tablet Take 1 tablet (600 mg total) by mouth every 6 (six) hours as needed. 30 tablet 0  . Multiple Vitamin (MULTIVITAMIN) tablet Take 1 tablet by mouth daily.    . naproxen (NAPROSYN) 500 MG tablet Take 1 tablet (500 mg total) by mouth 2 (two) times daily with a meal.  30 tablet 0  . triamcinolone (NASACORT) 55 MCG/ACT AERO nasal inhaler Place 2 sprays into the nose daily. 1 Inhaler 12  . vitamin B-12 (CYANOCOBALAMIN) 1000 MCG tablet Take 1 tablet (1,000 mcg total) by mouth daily. 90 tablet 1   No current facility-administered medications on file prior to visit.    Review of Systems As in subjective    Objective:   Physical Exam Due to coronavirus pandemic stay at home measures, patient visit was virtual and they were not examined in person.   Gen: wd, wn nad Right axilla with scarring and one area of bulging pink/red lesion suggestive of active fluctuance lesion, left axilla normal     Assessment:      Encounter Diagnoses  Name Primary?  Marland Kitchen Respiratory tract infection Yes  . Hidradenitis axillaris        Plan:     Respiratory tract infection-advise rest, hydration, can continue Zyrtec or do Mucinex DM, and begin Augmentin  Chronic hidradenitis - c/t doxycycline for prevention, add Augmentin for acute flare, refer to dermatology for other management options.  Wendolyn was seen today for sinus problem, abdominal pain and recurrent skin infections.  Diagnoses and all orders for this visit:  Respiratory tract infection  Hidradenitis axillaris -     Ambulatory referral to Dermatology  Other orders -     amoxicillin-clavulanate (AUGMENTIN) 875-125 MG tablet; Take 1 tablet by mouth 2 (two) times daily.   F/u pending referral

## 2019-10-07 ENCOUNTER — Other Ambulatory Visit: Payer: Self-pay | Admitting: Medical

## 2019-10-07 MED ORDER — FLUCONAZOLE 150 MG PO TABS: 150.0000 mg | ORAL_TABLET | Freq: Once | ORAL | 0 refills | Status: AC

## 2019-10-07 MED FILL — FLUCONAZOLE 150 MG TABS: 150 | 7 days supply | Qty: 2 | Fill #0

## 2019-10-25 DIAGNOSIS — L732 Hidradenitis suppurativa: Secondary | ICD-10-CM | POA: Diagnosis not present

## 2019-11-20 ENCOUNTER — Ambulatory Visit: Payer: Self-pay | Admitting: Surgery

## 2019-11-20 DIAGNOSIS — L732 Hidradenitis suppurativa: Secondary | ICD-10-CM | POA: Diagnosis not present

## 2019-11-20 NOTE — H&P (Signed)
Surgical Evaluation   HPI: A very pleasant 39 year old woman presents with a 15 year history of chronic relapsing localized area of hidradenitis in the right axilla. She has had numerous flares of this with swelling, pain, and purulent drainage and has tried several antibiotics, topical therapies, etc. It has developed hypertrophic scar and is somewhat tender with pain radiating to the right shoulder. She is interested in having this area excised  She is a Marine scientist at [employer].  Allergies  Allergen Reactions  . Hydrocodone-Acetaminophen Nausea Only  . Vicodin [Hydrocodone-Acetaminophen]     uneasy    Past Medical History:  Diagnosis Date  . Allergy   . GERD (gastroesophageal reflux disease)    with pregnancy  . Goiter   . Headache(784.0)   . Hidradenitis axillaris   . Impaired fasting blood sugar   . Insomnia   . Obesity   . PONV (postoperative nausea and vomiting)   . Thyroid nodule    biopsy 07/2013, consult with Dr. Iran Planas endocrinology  . Tobacco use disorder     Past Surgical History:  Procedure Laterality Date  . CESAREAN SECTION  12/21/2011   Procedure: CESAREAN SECTION;  Surgeon: Farrel Gobble. Harrington Challenger, MD;  Location: [facility];  Service: Obstetrics;  Laterality: N/A;  Bilateral Tubal Ligation  . CHOLECYSTECTOMY  2009  . INCISION AND DRAINAGE     right axillary boils  . PILONIDAL CYST / SINUS EXCISION  2008   [facility]  . TUBAL LIGATION    . VAGINAL DELIVERY  2002, 1998    Family History  Problem Relation Age of Onset  . Hypertension Mother   . Diabetes Mother   . Hypertension Sister   . Cancer Maternal Grandmother        colon, ovarian  . Hypertension Sister   . Arthritis Father   . Cancer Maternal Aunt        breast  . Heart disease Neg Hx   . Stroke Neg Hx     Social History   Socioeconomic History  . Marital status: Married    Spouse name: Not on file  . Number of children: Not on file  . Years of education: Not on file  .  Highest education level: Not on file  Occupational History  . Occupation: Optician, dispensing: [employer]  Tobacco Use  . Smoking status: Former Smoker    Packs/day: 0.30    Years: 12.00    Pack years: 3.60    Types: Cigarettes  . Smokeless tobacco: Never Used  Vaping Use  . Vaping Use: Never used  Substance and Sexual Activity  . Alcohol use: Yes    Alcohol/week: 1.0 standard drink    Types: 1 Shots of liquor per week  . Drug use: No  . Sexual activity: Not on file  Other Topics Concern  . Not on file  Social History Narrative   Married, 3 children ages 33 years old, 62 years old,  39yo, Darrick Meigs, exercises - zumba, fit camp.  Prior travel nursing.  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  11/2017   Social Determinants of Health   Financial Resource Strain:   . Difficulty of Paying Living Expenses:   Food Insecurity:   . Worried About Charity fundraiser in the Last Year:   . Arboriculturist in the Last Year:   Transportation Needs:   . Film/video editor (Medical):   Marland Kitchen Lack  of Transportation (Non-Medical):   Physical Activity:   . Days of Exercise per Week:   . Minutes of Exercise per Session:   Stress:   . Feeling of Stress :   Social Connections:   . Frequency of Communication with Friends and Family:   . Frequency of Social Gatherings with Friends and Family:   . Attends Religious Services:   . Active Member of Clubs or Organizations:   . Attends Archivist Meetings:   Marland Kitchen Marital Status:     Current Outpatient Medications on File Prior to Visit  Medication Sig Dispense Refill  . amoxicillin-clavulanate (AUGMENTIN) 875-125 MG tablet Take 1 tablet by mouth 2 (two) times daily. 20 tablet 0  . b complex vitamins capsule Take 1 capsule by mouth daily.    . cetirizine (ZYRTEC) 10 MG tablet Take 1 tablet (10 mg total) by mouth daily. 30 tablet 11  . cholecalciferol (VITAMIN D) 25 MCG (1000 UNIT) tablet TAKE 1 TABLET BY  MOUTH EVERY DAY 90 tablet 3  . clindamycin-benzoyl peroxide (BENZACLIN) gel APPLY TO AFFECTED AREA TWICE A DAY 25 g 1  . doxycycline (VIBRA-TABS) 100 MG tablet Take 1 tablet (100 mg total) by mouth daily. 30 tablet 1  . ibuprofen (ADVIL) 600 MG tablet Take 1 tablet (600 mg total) by mouth every 6 (six) hours as needed. 30 tablet 0  . Multiple Vitamin (MULTIVITAMIN) tablet Take 1 tablet by mouth daily.    . naproxen (NAPROSYN) 500 MG tablet Take 1 tablet (500 mg total) by mouth 2 (two) times daily with a meal. 30 tablet 0  . triamcinolone (NASACORT) 55 MCG/ACT AERO nasal inhaler Place 2 sprays into the nose daily. 1 Inhaler 12  . vitamin B-12 (CYANOCOBALAMIN) 1000 MCG tablet Take 1 tablet (1,000 mcg total) by mouth daily. 90 tablet 1   No current facility-administered medications on file prior to visit.    Review of Systems: a complete, 10pt review of systems was completed with pertinent positives and negatives as documented in the HPI  Physical Exam: Vitals Weight: 210.56 lb Height: 64in Body Surface Area: 2 m Body Mass Index: 36.14 kg/m  Temp.: 98.38F  Pulse: 79 (Regular)  BP: 124/82(Sitting, Left Arm, Standard)  Alert and well-appearing. Unlabored respirations. In the right axilla there is approximately 6 cm x 2.5 cm area of chronic hypertrophic scar, no active fluctuance or erythema or drainage at this time.   CBC Latest Ref Rng & Units 04/30/2019 05/18/2018 10/09/2017  WBC 3.4 - 10.8 x10E3/uL 8.0 8.3 8.8  Hemoglobin 11.1 - 15.9 g/dL 12.7 12.1 13.2  Hematocrit 34.0 - 46.6 % 37.5 36.8 40.6  Platelets 150 - 450 x10E3/uL 311 384 297    CMP Latest Ref Rng & Units 04/30/2019 05/18/2018 11/27/2017  Glucose 65 - 99 mg/dL 95 76 93  BUN 6 - 20 mg/dL 13 9 11   Creatinine 0.57 - 1.00 mg/dL 0.82 0.77 0.99  Sodium 134 - 144 mmol/L 141 141 140  Potassium 3.5 - 5.2 mmol/L 4.6 4.0 3.6  Chloride 96 - 106 mmol/L 104 103 101  CO2 20 - 29 mmol/L 24 23 20   Calcium 8.7 - 10.2 mg/dL 9.4 9.1  9.2  Total Protein 6.0 - 8.5 g/dL - - 7.7  Total Bilirubin 0.0 - 1.2 mg/dL - - 0.2  Alkaline Phos 39 - 117 IU/L - - 56  AST 0 - 40 IU/L - - 17  ALT 0 - 32 IU/L - - 17    No results found for:  INR, PROTIME  Imaging: No results found.   A/P: RIGHT AXILLARY HIDRADENITIS (L73.2) Story: Fairly localized but recalcitrant for the last 15 years. I do recommend local excision. We discussed the procedure including technical details and risk of infection, bleeding, wound healing problems or incision dehiscence requiring local wound care, recurrence of the disease, ongoing pain or scarring. Questions were welcomed and answered. She would like to proceed with scheduling.    Patient Active Problem List   Diagnosis Date Noted  . Pelvic pain 05/18/2018  . LLQ abdominal pain 05/18/2018  . Generalized abdominal pain 05/18/2018  . Vitamin D deficiency 03/13/2018  . Screening for cervical cancer 11/27/2017  . Hair thinning 10/09/2017  . Skin change 10/09/2017  . Acne 10/09/2017  . Sinusitis 08/17/2016  . Vaccine counseling 08/17/2016  . Insomnia 07/30/2015  . Tobacco use disorder 07/30/2015  . Screening for diabetes mellitus 07/30/2015  . Rhinitis, allergic 07/30/2015  . History of anemia 07/30/2015  . Hidradenitis axillaris 07/30/2015  . Obesity 02/23/2015  . Cephalalgia 02/23/2015  . Thyroid goiter 02/23/2015  . Impaired fasting blood sugar 02/23/2015  . Fatigue 03/26/2011       Romana Juniper, MD [facility]  See AMION to contact appropriate on-call provider

## 2019-12-10 ENCOUNTER — Telehealth: Payer: Self-pay | Admitting: Medical

## 2019-12-10 NOTE — Telephone Encounter (Signed)
Dismissal letter in guarantor snapshot  °

## 2020-01-09 ENCOUNTER — Other Ambulatory Visit: Payer: Self-pay

## 2020-01-09 ENCOUNTER — Encounter: Payer: Self-pay | Admitting: Family Medicine

## 2020-01-09 ENCOUNTER — Ambulatory Visit (INDEPENDENT_AMBULATORY_CARE_PROVIDER_SITE_OTHER): Payer: 59 | Admitting: Family Medicine

## 2020-01-09 VITALS — BP 100/68 | HR 72 | Temp 98.9°F | Ht 64.0 in | Wt 210.0 lb

## 2020-01-09 DIAGNOSIS — L03221 Cellulitis of neck: Secondary | ICD-10-CM | POA: Diagnosis not present

## 2020-01-09 DIAGNOSIS — L0211 Cutaneous abscess of neck: Secondary | ICD-10-CM

## 2020-01-09 MED ORDER — IBUPROFEN 600 MG PO TABS: 600.0000 mg | ORAL_TABLET | Freq: Four times a day (QID) | ORAL | 0 refills | Status: DC | PRN

## 2020-01-09 MED ORDER — DOXYCYCLINE HYCLATE 100 MG PO TABS: 100.0000 mg | ORAL_TABLET | Freq: Every day | ORAL | 0 refills | Status: DC

## 2020-01-09 MED FILL — DOXYCYCLINE HYCLATE 100 MG: 100 | 10 days supply | Qty: 20 | Fill #0

## 2020-01-09 MED FILL — IBUPROFEN 600 MG TABLET: 600 | 8 days supply | Qty: 30 | Fill #0

## 2020-01-09 NOTE — Patient Instructions (Signed)
You have an abscess on your neck. Continue to apply warm compresses frequently.   Take the antibiotic twice daily for 10 days. If it doesn't start draining on its own, and it becomes larger and more painful, you may need to return to care to have it drained.  Use ibuprofen or tylenol if needed for pain.

## 2020-01-09 NOTE — Progress Notes (Signed)
Chief Complaint  Patient presents with  . Mass    on back of neck. Thinks bump developed about a week ago and it is sore. Has been having some pain in that area for about a month.   . Flu Vaccine    will get at work.     Starting possibly up to a month ago she noticed some soreness at the back of the neck. It got worse over a week ago, and she noticed a bump last week. This week it flared up, and is red and tender.  Causing spasms and tension into both sides of her neck.  Felt good/better in the hot shower, and with using warm compresses. There hasn't been any drainage.  She feels feverish, decreased appetite.  She has h/o hydradenitis suppurativa, and pain is similar, though in a new location. She used to take doxycycline daily preventatively, but has been off that for about 4-6 months.   Reports her HS is flaring some.   PMH< PSH, SH reviewed  Restarted smoking 3 cigarettes/day  Outpatient Encounter Medications as of 01/09/2020  Medication Sig  . acetaminophen (TYLENOL) 500 MG tablet Take 1,000 mg by mouth every 6 (six) hours as needed.  Marland Kitchen b complex vitamins capsule Take 1 capsule by mouth daily.  . cetirizine (ZYRTEC) 10 MG tablet Take 1 tablet (10 mg total) by mouth daily.  . Multiple Vitamin (MULTIVITAMIN) tablet Take 1 tablet by mouth daily.  . clindamycin-benzoyl peroxide (BENZACLIN) gel APPLY TO AFFECTED AREA TWICE A DAY (Patient not taking: Reported on 01/09/2020)  . ibuprofen (ADVIL) 600 MG tablet Take 1 tablet (600 mg total) by mouth every 6 (six) hours as needed. (Patient not taking: Reported on 01/09/2020)  . [DISCONTINUED] amoxicillin-clavulanate (AUGMENTIN) 875-125 MG tablet Take 1 tablet by mouth 2 (two) times daily.  . [DISCONTINUED] cholecalciferol (VITAMIN D) 25 MCG (1000 UNIT) tablet TAKE 1 TABLET BY MOUTH EVERY DAY  . [DISCONTINUED] doxycycline (VIBRA-TABS) 100 MG tablet Take 1 tablet (100 mg total) by mouth daily.  . [DISCONTINUED] naproxen (NAPROSYN) 500 MG  tablet Take 1 tablet (500 mg total) by mouth 2 (two) times daily with a meal.  . [DISCONTINUED] triamcinolone (NASACORT) 55 MCG/ACT AERO nasal inhaler Place 2 sprays into the nose daily.  . [DISCONTINUED] vitamin B-12 (CYANOCOBALAMIN) 1000 MCG tablet Take 1 tablet (1,000 mcg total) by mouth daily.   No facility-administered encounter medications on file as of 01/09/2020.   Allergies  Allergen Reactions  . Hydrocodone-Acetaminophen Nausea Only  . Vicodin [Hydrocodone-Acetaminophen]     uneasy   ROS: tactile fevers, no chills, no URI symptoms, cough, shortness of breath, chest pain.  No nausea, vomiting.  +shoulder pain, neck pain, decreased appetite. No other rash, no bleeding, bruising. Moods are good.   PHYSICAL EXAM:  BP 100/68   Pulse 72   Temp 98.9 F (37.2 C) (Tympanic)   Ht 5\' 4"  (1.626 m)   Wt 210 lb (95.3 kg)   LMP 01/04/2020   BMI 36.05 kg/m   Pleasant female, in mild discomfort, but otherwise good spirits. HEENT: conjunctiva and sclera are clear, EOMI. Wearing mask Neck: supple, no meningismus.  No lymphadenopathy. 3.5 x 4 cm tender mass at the lower posterior neck, just to the right of midline No significant fluctuance centrally. Mild overlying erythema, but not spread behind the raised portion of skin.   ASSESSMENT/PLAN:  Cellulitis and abscess of neck - not yet ready for I&D. Start Doxy BID x 10d. Cont moist compresses. Return for I&D  if worsening and not draining on its own. - Plan: doxycycline (VIBRA-TABS) 100 MG tablet

## 2020-01-20 ENCOUNTER — Telehealth: Payer: Self-pay | Admitting: Medical

## 2020-01-20 ENCOUNTER — Other Ambulatory Visit: Payer: Self-pay | Admitting: Medical

## 2020-01-20 MED ORDER — FLUCONAZOLE 100 MG PO TABS: ORAL_TABLET | ORAL | 0 refills | Status: DC

## 2020-01-20 MED FILL — FLUCONAZOLE 100 MG TABLET: 100 | 6 days supply | Qty: 3 | Fill #0

## 2020-01-20 NOTE — Telephone Encounter (Signed)
Spoke with Pt and she said the drainage was thick this morning but its better now and she will call back tomorrow if it gets worse she also wanted to see if she could get a Diflucan called in because the antibiotic has caused vaginal itching that she said is normal for her when she use an antibiotic. She uses the [facility] out patient pharmacy.

## 2020-01-20 NOTE — Telephone Encounter (Signed)
I do not think we actually saw her for this although there was likely some phone back-and-forth messages.  So at this point if not at least 50% improved we should probably see her in person to evaluate and examine  Sometimes we need to change antibiotics, sometimes we actually need to incise and help assist in the drainage of pus

## 2020-01-20 NOTE — Telephone Encounter (Signed)
done

## 2020-01-23 ENCOUNTER — Telehealth: Payer: Self-pay | Admitting: Medical

## 2020-01-23 NOTE — Telephone Encounter (Signed)
Sent message via mychart for patient to let us know what she decides

## 2020-01-23 NOTE — Telephone Encounter (Signed)
Given the recurrence and problems she has had in the past year, it is probably time to refer to dermatology or plastic surgery for consult.  Either 1 would be appropriate.  Let us refer depending on her preference

## 2020-02-03 ENCOUNTER — Ambulatory Visit (INDEPENDENT_AMBULATORY_CARE_PROVIDER_SITE_OTHER): Payer: 59 | Admitting: Medical

## 2020-02-03 ENCOUNTER — Encounter: Payer: Self-pay | Admitting: Medical

## 2020-02-03 ENCOUNTER — Other Ambulatory Visit: Payer: Self-pay | Admitting: Medical

## 2020-02-03 ENCOUNTER — Other Ambulatory Visit: Payer: Self-pay

## 2020-02-03 VITALS — BP 108/84 | HR 58 | Ht 64.0 in | Wt 214.6 lb

## 2020-02-03 DIAGNOSIS — Z13220 Encounter for screening for lipoid disorders: Secondary | ICD-10-CM | POA: Insufficient documentation

## 2020-02-03 DIAGNOSIS — Z23 Encounter for immunization: Secondary | ICD-10-CM

## 2020-02-03 DIAGNOSIS — Z139 Encounter for screening, unspecified: Secondary | ICD-10-CM | POA: Insufficient documentation

## 2020-02-03 DIAGNOSIS — Z131 Encounter for screening for diabetes mellitus: Secondary | ICD-10-CM | POA: Diagnosis not present

## 2020-02-03 DIAGNOSIS — E559 Vitamin D deficiency, unspecified: Secondary | ICD-10-CM

## 2020-02-03 DIAGNOSIS — Z809 Family history of malignant neoplasm, unspecified: Secondary | ICD-10-CM

## 2020-02-03 DIAGNOSIS — Z7185 Encounter for immunization safety counseling: Secondary | ICD-10-CM | POA: Diagnosis not present

## 2020-02-03 DIAGNOSIS — Z0000 Encounter for general adult medical examination without abnormal findings: Secondary | ICD-10-CM

## 2020-02-03 DIAGNOSIS — J329 Chronic sinusitis, unspecified: Secondary | ICD-10-CM

## 2020-02-03 DIAGNOSIS — E049 Nontoxic goiter, unspecified: Secondary | ICD-10-CM | POA: Diagnosis not present

## 2020-02-03 DIAGNOSIS — Z6836 Body mass index (BMI) 36.0-36.9, adult: Secondary | ICD-10-CM | POA: Insufficient documentation

## 2020-02-03 DIAGNOSIS — L732 Hidradenitis suppurativa: Secondary | ICD-10-CM | POA: Diagnosis not present

## 2020-02-03 DIAGNOSIS — Z720 Tobacco use: Secondary | ICD-10-CM | POA: Diagnosis not present

## 2020-02-03 DIAGNOSIS — R7301 Impaired fasting glucose: Secondary | ICD-10-CM

## 2020-02-03 DIAGNOSIS — J301 Allergic rhinitis due to pollen: Secondary | ICD-10-CM

## 2020-02-03 LAB — LIPID PANEL

## 2020-02-03 MED ORDER — DOXYCYCLINE HYCLATE 100 MG PO TABS: 100.0000 mg | ORAL_TABLET | Freq: Two times a day (BID) | ORAL | 2 refills | Status: DC

## 2020-02-03 MED ORDER — FLUCONAZOLE 150 MG PO TABS: 150.0000 mg | ORAL_TABLET | ORAL | 2 refills | Status: DC

## 2020-02-03 NOTE — Progress Notes (Signed)
Subjective:   HPI  Jacqueline Winters is a 39 y.o. female who presents for Chief Complaint  Patient presents with  . Annual Exam    with fasting labs     Patient Care Team: Zahraa Bhargava, Camelia Eng, PA-C as PCP - General (Family Medicine) Sees dentist: Dr. Marian Sorrow [facility]) Sees eye doctor:Lens Crafter  Concerns:wants abscess on back to be looked at. Had recent abscess pop up on upper back.  Saw Dr. Tomi Bamberger here for this, but not completely resolved .  Tylenol doesn't seem to help the pains.  Gynecological history: last pap 11/27/2017.  Saw Dr. Deatra Ina after 2019 abnormal pap.  Since HPV negative at that time after seeing gyn, Dr. Clint Guy wasn't worried.    3 pregnancies, 3 live births. Periods are regular, periods are not heavy except first 2 days. patient sees gynecology for gynecology care.  Takes Ibuprofen every night.  Takes this night to relax, for menstrual cramps, for abscess pain in armpits, and for sinus problems  Stopped tobacco in February but still smokes occasionally.  Not smoking daily.  Reviewed their medical, surgical, family, social, medication, and allergy history and updated chart as appropriate.  Past Medical History:  Diagnosis Date  . Allergy   . GERD (gastroesophageal reflux disease)    with pregnancy  . Goiter   . Headache(784.0)   . Hidradenitis axillaris   . Impaired fasting blood sugar   . Insomnia   . Obesity   . PONV (postoperative nausea and vomiting)   . Thyroid nodule    biopsy 07/2013, consult with Dr. Iran Planas endocrinology  . Tobacco use disorder     Family History  Problem Relation Age of Onset  . Hypertension Mother   . Diabetes Mother   . Hypertension Sister   . Cancer Maternal Grandmother        colon, ovarian  . Hypertension Sister   . Arthritis Father   . Cancer Maternal Aunt        breast  . Heart disease Neg Hx   . Stroke Neg Hx      Current Outpatient Medications:  .  acetaminophen (TYLENOL) 500 MG tablet, Take  1,000 mg by mouth every 6 (six) hours as needed., Disp: , Rfl:  .  b complex vitamins capsule, Take 1 capsule by mouth daily., Disp: , Rfl:  .  cetirizine (ZYRTEC) 10 MG tablet, Take 1 tablet (10 mg total) by mouth daily., Disp: 30 tablet, Rfl: 11 .  ibuprofen (ADVIL) 600 MG tablet, Take 1 tablet (600 mg total) by mouth every 6 (six) hours as needed., Disp: 30 tablet, Rfl: 0 .  Multiple Vitamin (MULTIVITAMIN) tablet, Take 1 tablet by mouth daily., Disp: , Rfl:  .  clindamycin-benzoyl peroxide (BENZACLIN) gel, APPLY TO AFFECTED AREA TWICE A DAY (Patient not taking: Reported on 01/09/2020), Disp: 25 g, Rfl: 1 .  doxycycline (VIBRA-TABS) 100 MG tablet, Take 1 tablet (100 mg total) by mouth daily. (Patient not taking: Reported on 02/03/2020), Disp: 20 tablet, Rfl: 0 .  doxycycline (VIBRA-TABS) 100 MG tablet, Take 1 tablet (100 mg total) by mouth 2 (two) times daily. Recurrent boils, Disp: 14 tablet, Rfl: 2 .  fluconazole (DIFLUCAN) 100 MG tablet, 1 tablet every other day (Patient not taking: Reported on 02/03/2020), Disp: 3 tablet, Rfl: 0 .  fluconazole (DIFLUCAN) 150 MG tablet, Take 1 tablet (150 mg total) by mouth once a week. Antibiotic induced, Disp: 2 tablet, Rfl: 2  Allergies  Allergen Reactions  . Hydrocodone-Acetaminophen Nausea  Only  . Vicodin [Hydrocodone-Acetaminophen]     uneasy     Review of Systems Constitutional: -fever, -chills, -sweats, -unexpected weight change, -decreased appetite, -fatigue Allergy: -sneezing, -itching, -congestion Dermatology: -changing moles, +rash, -lumps ENT: -runny nose, -ear pain, -sore throat, -hoarseness, +sinus pain, -teeth pain, - ringing in ears, -hearing loss, -nosebleeds Cardiology: -chest pain, -palpitations, -swelling, -difficulty breathing when lying flat, -waking up short of breath Respiratory: -cough, -shortness of breath, -difficulty breathing with exercise or exertion, -wheezing, -coughing up blood Gastroenterology: -abdominal pain,  -nausea, -vomiting, -diarrhea, -constipation, -blood in stool, -changes in bowel movement, -difficulty swallowing or eating Hematology: -bleeding, -bruising  Musculoskeletal: -joint aches, -muscle aches, -joint swelling, -back pain, -neck pain, -cramping, -changes in gait Ophthalmology: denies vision changes, eye redness, itching, discharge Urology: -burning with urination, -difficulty urinating, -blood in urine, -urinary frequency, -urgency, -incontinence Neurology: -headache, -weakness, -tingling, -numbness, -memory loss, -falls, -dizziness Psychology: -depressed mood, -agitation, -sleep problems Breast/gyn: -breast tendnerss, -discharge, -lumps, -vaginal discharge,- irregular periods, -heavy periods     Objective:  BP 108/84   Pulse (!) 58   Ht 5\' 4"  (1.626 m)   Wt 214 lb 9.6 oz (97.3 kg)   LMP 01/29/2020   SpO2 100%   BMI 36.84 kg/m   Wt Readings from Last 3 Encounters:  02/03/20 214 lb 9.6 oz (97.3 kg)  01/09/20 210 lb (95.3 kg)  10/01/19 211 lb (95.7 kg)    General appearance: alert, no distress, WD/WN, African American female Skin: Scarring right axilla from prior hidradenitis issues, posterior superior cervical spine over C7 with 3 cm round somewhat darkish colored area from recent abscess that is healing, otherwise no worrisome lesions HEENT: normocephalic, conjunctiva/corneas normal, sclerae anicteric, PERRLA, EOMi, nares patent, no discharge or erythema, pharynx normal Oral cavity: Not examined due to Covid precautions Neck: supple, no lymphadenopathy, generalized goiter without nodules, no masses, normal ROM, no bruits Chest: non tender, normal shape and expansion Heart: RRR, normal S1, S2, no murmurs Lungs: CTA bilaterally, no wheezes, rhonchi, or rales Abdomen: +bs, soft, non tender, non distended, no masses, no hepatomegaly, no splenomegaly, no bruits Back: non tender, normal ROM, no scoliosis Musculoskeletal: upper extremities non tender, no obvious deformity,  normal ROM throughout, lower extremities non tender, no obvious deformity, normal ROM throughout Extremities: no edema, no cyanosis, no clubbing Pulses: 2+ symmetric, upper and lower extremities, normal cap refill Neurological: alert, oriented x 3, CN2-12 intact, strength normal upper extremities and lower extremities, sensation normal throughout, DTRs 2+ throughout, no cerebellar signs, gait normal Psychiatric: normal affect, behavior normal, pleasant  Breast/gyn/rectal - deferred to gynecology   Assessment and Plan :   Encounter Diagnoses  Name Primary?  . Encounter for health maintenance examination in adult Yes  . Hidradenitis axillaris   . Vaccine counseling   . Vitamin D deficiency   . Screening for diabetes mellitus   . Screening for lipid disorders   . Tobacco use   . Impaired fasting blood sugar   . Thyroid goiter   . Sinusitis, unspecified chronicity, unspecified location   . Allergic rhinitis due to pollen, unspecified seasonality   . BMI 36.0-36.9,adult   . Screening for condition   . Family history of cancer     Physical exam - discussed and counseled on healthy lifestyle, diet, exercise, preventative care, vaccinations, sick and well care, proper use of emergency dept and after hours care, and addressed their concerns.    Health screening: Advised they see their eye doctor yearly for routine vision care. Advised they  see their dentist yearly for routine dental care including hygiene visits twice yearly. See your gynecologist yearly for routine gynecological care.  Cancer screening Counseled on self breast exams, mammograms, cervical cancer screening  She has plans to follow-up with gynecology for breast exam and surveillance   Vaccinations: Advised yearly influenza vaccine Counseled on the influenza virus vaccine.  Vaccine information sheet given.  Influenza vaccine given after consent obtained.  We discussed Covid booster.  She wants to check for nebulizer  before doing vaccination booster today.  She has had the initial Covid vaccine     Separate significant issues discussed: Hidradenitis-refilled doxycycline to have on hand for flares as she has gotten quite a few these.  She does have follow-up with plastic surgery scheduled soon.  She does plan to have plastic surgery of the right axilla.  I also refilled Diflucan to have on hand for possible yeast infection when she uses doxycycline or antibiotics.  I reviewed her 2019 ultrasound thyroid that did not warrant any repeat biopsy.  Labs today.  Consider ultrasound periodically.  No obvious enlargement of the thyroid on today's exam, no obvious nodule  Vitamin D deficiency-continue supplement  Tobacco use intermittent-advised complete cessation  Impaired glucose-labs today  Sinus issues-advise she continue allergy medicine but consider seeing ENT or considering other plans such as nasal saline flush.  Either way she needs to quit using NSAIDs regularly  Referral to geneticist for further discussion and potential screening given her concern for cancer screening given family history of cancers   Manasi was seen today for annual exam.  Diagnoses and all orders for this visit:  Encounter for health maintenance examination in adult -     TSH -     Hepatitis C antibody -     Comprehensive metabolic panel -     CBC with Differential/Platelet -     Hemoglobin A1c -     Lipid panel -     VITAMIN D 25 Hydroxy (Vit-D Deficiency, Fractures) -     SAR CoV2 Serology (COVID 19)AB(IGG)IA  Hidradenitis axillaris  Vaccine counseling  Vitamin D deficiency -     VITAMIN D 25 Hydroxy (Vit-D Deficiency, Fractures)  Screening for diabetes mellitus -     Hemoglobin A1c  Screening for lipid disorders -     Lipid panel  Tobacco use  Impaired fasting blood sugar -     Hemoglobin A1c  Thyroid goiter -     TSH  Sinusitis, unspecified chronicity, unspecified location  Allergic rhinitis due  to pollen, unspecified seasonality  BMI 36.0-36.9,adult  Screening for condition -     SAR CoV2 Serology (COVID 19)AB(IGG)IA  Family history of cancer -     Ambulatory referral to Genetics  Other orders -     Flu Vaccine QUAD 6+ mos PF IM (Fluarix Quad PF) -     doxycycline (VIBRA-TABS) 100 MG tablet; Take 1 tablet (100 mg total) by mouth 2 (two) times daily. Recurrent boils -     fluconazole (DIFLUCAN) 150 MG tablet; Take 1 tablet (150 mg total) by mouth once a week. Antibiotic induced   Follow-up pending labs, yearly for physical

## 2020-02-04 LAB — CBC WITH DIFFERENTIAL/PLATELET
Basophils Absolute: 0 10*3/uL (ref 0.0–0.2)
Basos: 1 %
EOS (ABSOLUTE): 0.1 10*3/uL (ref 0.0–0.4)
Eos: 1 %
Hematocrit: 36.3 % (ref 34.0–46.6)
Hemoglobin: 11.6 g/dL (ref 11.1–15.9)
Immature Grans (Abs): 0 10*3/uL (ref 0.0–0.1)
Immature Granulocytes: 0 %
Lymphocytes Absolute: 2.2 10*3/uL (ref 0.7–3.1)
Lymphs: 28 %
MCH: 28 pg (ref 26.6–33.0)
MCHC: 32 g/dL (ref 31.5–35.7)
MCV: 88 fL (ref 79–97)
Monocytes Absolute: 0.6 10*3/uL (ref 0.1–0.9)
Monocytes: 7 %
Neutrophils Absolute: 4.9 10*3/uL (ref 1.4–7.0)
Neutrophils: 63 %
Platelets: 343 10*3/uL (ref 150–450)
RBC: 4.14 x10E6/uL (ref 3.77–5.28)
RDW: 13.7 % (ref 11.7–15.4)
WBC: 7.9 10*3/uL (ref 3.4–10.8)

## 2020-02-04 LAB — COMPREHENSIVE METABOLIC PANEL
ALT: 18 IU/L (ref 0–32)
AST: 19 IU/L (ref 0–40)
Albumin/Globulin Ratio: 1.5 (ref 1.2–2.2)
Albumin: 4.2 g/dL (ref 3.8–4.8)
Alkaline Phosphatase: 61 IU/L (ref 44–121)
BUN/Creatinine Ratio: 12 (ref 9–23)
BUN: 10 mg/dL (ref 6–20)
Bilirubin Total: <0.2 mg/dL (ref 0.0–1.2)
CO2: 23 mmol/L (ref 20–29)
Calcium: 9.2 mg/dL (ref 8.7–10.2)
Chloride: 105 mmol/L (ref 96–106)
Creatinine, Ser: 0.81 mg/dL (ref 0.57–1.00)
GFR calc Af Amer: 107 mL/min/{1.73_m2} (ref >59)
GFR calc non Af Amer: 92 mL/min/{1.73_m2} (ref >59)
Globulin, Total: 2.8 g/dL (ref 1.5–4.5)
Glucose: 82 mg/dL (ref 65–99)
Potassium: 4.4 mmol/L (ref 3.5–5.2)
Sodium: 142 mmol/L (ref 134–144)
Total Protein: 7 g/dL (ref 6.0–8.5)

## 2020-02-04 LAB — LIPID PANEL
Chol/HDL Ratio: 2.3 ratio (ref 0.0–4.4)
Cholesterol, Total: 148 mg/dL (ref 100–199)
HDL: 64 mg/dL (ref >39)
LDL Chol Calc (NIH): 73 mg/dL (ref 0–99)
Triglycerides: 50 mg/dL (ref 0–149)
VLDL Cholesterol Cal: 11 mg/dL (ref 5–40)

## 2020-02-04 LAB — HEPATITIS C ANTIBODY: Hep C Virus Ab: <0.1 s/co ratio (ref 0.0–0.9)

## 2020-02-04 LAB — HEMOGLOBIN A1C
Est. average glucose Bld gHb Est-mCnc: 114 mg/dL
Hgb A1c MFr Bld: 5.6 % (ref 4.8–5.6)

## 2020-02-04 LAB — SAR COV2 SEROLOGY (COVID19)AB(IGG),IA: DiaSorin SARS-CoV-2 Ab, IgG: POSITIVE

## 2020-02-04 LAB — VITAMIN D 25 HYDROXY (VIT D DEFICIENCY, FRACTURES): Vit D, 25-Hydroxy: 33.5 ng/mL (ref 30.0–100.0)

## 2020-02-04 LAB — TSH: TSH: 0.717 u[IU]/mL (ref 0.450–4.500)

## 2020-02-04 MED FILL — DOXYCYCLINE HYCLATE 100 MG: 100 | 7 days supply | Qty: 14 | Fill #0

## 2020-02-04 MED FILL — FLUCONAZOLE 150 MG TABS: 150 | 14 days supply | Qty: 2 | Fill #0

## 2020-02-04 NOTE — Progress Notes (Signed)
Done

## 2020-02-13 ENCOUNTER — Telehealth: Payer: Self-pay | Admitting: Genetic Counselor

## 2020-02-13 NOTE — Telephone Encounter (Signed)
Patient left VM on my phone.  I called back and LM on her VM.  I provided CB instructions.

## 2020-02-20 NOTE — Telephone Encounter (Signed)
LM on VM that I was trying to call and set up appointment.  Left CB instructions.

## 2020-02-24 NOTE — Telephone Encounter (Signed)
Contacted patient.  Explained process and workflow for determining eligibility for genetic testing.  Patient wants to think about it. She is unsure if sihe will go through testing or wait until she had her mammogram at 34.

## 2020-03-12 MED FILL — DOXYCYCLINE HYCLATE 100 MG: 100 | 7 days supply | Qty: 14 | Fill #1

## 2020-03-31 MED FILL — FLUCONAZOLE 150 MG TABS: 150 | 14 days supply | Qty: 2 | Fill #1

## 2020-03-31 MED FILL — DOXYCYCLINE HYCLATE 100 MG: 100 | 7 days supply | Qty: 14 | Fill #2

## 2020-04-07 ENCOUNTER — Ambulatory Visit: Payer: Self-pay | Attending: Internal Medicine

## 2020-04-07 DIAGNOSIS — Z23 Encounter for immunization: Secondary | ICD-10-CM

## 2020-04-07 NOTE — Progress Notes (Signed)
   Covid-19 Vaccination Clinic  Name:  Jacqueline Winters    MRN: [id number] DOB: 08/29/1980  04/07/2020  [name] was observed post Covid-19 immunization for 15 minutes without incident. She was provided with Vaccine Information Sheet and instruction to access the V-Safe system.   [name] was instructed to call 911 with any severe reactions post vaccine: Marland Kitchen Difficulty breathing  . Swelling of face and throat  . A fast heartbeat  . A bad rash all over body  . Dizziness and weakness   Immunizations Administered    Name Date Dose VIS Date Route   Pfizer COVID-19 Vaccine 04/07/2020  1:45 PM 0.3 mL 01/29/2020 Intramuscular   Manufacturer: [employer], [employer]   Lot: [id number]   NDC: [address]-7

## 2020-04-16 ENCOUNTER — Telehealth: Payer: Self-pay | Admitting: Medical

## 2020-04-16 NOTE — Telephone Encounter (Signed)
Advised 

## 2020-04-16 NOTE — Telephone Encounter (Signed)
See her email message.  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   You can use Tylenol for pain or fever You can use over the counter Delsym or mucinex DM for cough. You can use over the counter Emetrol for nausea.    Consider EmergenC Immune plus vitamin pack over the counter  If you are having trouble breathing, if you are very weak, have high fever 103 or higher consistently despite Tylenol, or uncontrollable nausea and vomiting, then call or go to the emergency department.    Covid symptoms such as fatigue and cough can linger over 2 weeks, even after the initial fever, aches, chills, and other initial symptoms.  If worse or not improving in the next few days, particualr with breathing then call back.     Self Quarantine: The CDC, Centers for Disease Control has recommended a self quarantine of 7-10 days from the start of your illness until you are symptom-free including at least 24 hours of no symptoms including no fever, no shortness of breath, and no body aches and chills, by day 7-10 before returning to work or general contact with the public.  What does self quarantine mean: avoiding contact with people as much as possible.   Particularly in your house, isolate your self from others in a separate room, wear a mask when possible in the room, particularly if coughing a lot.   Have others bring food, water, medications, etc., to your door, but avoid direct contact with your household contacts during this time to avoid spreading the infection to them.   If you have a separate bathroom and living quarters during the next 2 weeks away  from others, that would be preferable.    If you can't completely isolate, then wear a mask, wash hands frequently with soap and water for at least 15 seconds, minimize close contact with others, and have a friend or family member check regularly from a distance to make sure you are not getting seriously worse.     You should not be going out in public, should not be going to stores, to work or other public places until all your symptoms have resolved and at least 7 days + 24 hours of no symptoms at all have transpired.   Ideally you should avoid contact with others for a full 7 days if possible.  One of the goals is to limit spread to high risk people; people that are older and elderly, people with multiple health issues like diabetes, heart disease, lung disease, and anybody that has weakened immune systems such as people with cancer or on immunosuppressive therapy.

## 2020-04-24 ENCOUNTER — Other Ambulatory Visit: Payer: Self-pay

## 2020-04-24 ENCOUNTER — Ambulatory Visit: Payer: 59 | Admitting: Medical

## 2020-04-24 ENCOUNTER — Ambulatory Visit
Admission: RE | Admit: 2020-04-24 | Discharge: 2020-04-24 | Disposition: A | Discharge status: Home / Self Care | Payer: 59 | Source: Ambulatory Visit | Attending: Medical | Admitting: Medical

## 2020-04-24 ENCOUNTER — Encounter: Payer: Self-pay | Admitting: Medical

## 2020-04-24 VITALS — BP 106/70 | HR 83 | Ht 64.0 in | Wt 212.0 lb

## 2020-04-24 DIAGNOSIS — U071 COVID-19: Secondary | ICD-10-CM

## 2020-04-24 DIAGNOSIS — R059 Cough, unspecified: Secondary | ICD-10-CM

## 2020-04-24 DIAGNOSIS — R52 Pain, unspecified: Secondary | ICD-10-CM

## 2020-04-24 DIAGNOSIS — R0789 Other chest pain: Secondary | ICD-10-CM | POA: Diagnosis not present

## 2020-04-24 DIAGNOSIS — B342 Coronavirus infection, unspecified: Secondary | ICD-10-CM | POA: Diagnosis not present

## 2020-04-24 NOTE — Progress Notes (Signed)
Subjective:  Jacqueline Winters is a 40 y.o. female who presents for Chief Complaint  Patient presents with  . Cough    Cough with chest discomfort post covid      Here for covid infection f/u.   First symptoms 04/09/20.   Tested positive 04/10/20.   Current symptoms include persistent mucous, burning in chest.  Initial symptoms were chills, fever, up to 100.4, spasm in throat, fatigue, elevated heart rate with activity, but would rest.  Has some nausea, belly cramping.  But period started last week as well.  Had 1 loose stool.  No wheezing, but has had some sob.  Is doing well with hydration.    Using some tylenol and aleve for aches.  Using mucinex BID.   Belching more of late.  Has been using some extra vit C, vit D, and zinc.  She is a smoker  Had the covid vaccine early in 2021, then just got booster 04/07/20.  No other aggravating or relieving factors.    No other c/o.  Past Medical History:  Diagnosis Date  . Allergy   . GERD (gastroesophageal reflux disease)    with pregnancy  . Goiter   . Headache(784.0)   . Hidradenitis axillaris   . Impaired fasting blood sugar   . Insomnia   . Obesity   . PONV (postoperative nausea and vomiting)   . Thyroid nodule    biopsy 07/2013, consult with Dr. Iran Planas endocrinology  . Tobacco use disorder     The following portions of the patient's history were reviewed and updated as appropriate: allergies, current medications, past family history, past medical history, past social history, past surgical history and problem list.  ROS Otherwise as in subjective above  Objective: BP 106/70   Pulse 83   Ht 5\' 4"  (1.626 m)   Wt 212 lb (96.2 kg)   SpO2 99%   BMI 36.39 kg/m   General appearance: alert, no distress, well developed, well nourished HEENT: normocephalic, sclerae anicteric, conjunctiva pink and moist, TMs pearly, nares patent, no discharge or erythema, pharynx with some post nasal drainage Oral cavity: MMM, no  lesions Neck: supple, no lymphadenopathy, no thyromegaly, no masses, no JVD Heart: RRR, normal S1, S2, no murmurs Lungs: mild dullness right mid fields, otherwise no wheezes, rhonchi, or rales Pulses: 2+ radial pulses, 2+ pedal pulses, normal cap refill Ext: no edema   Assessment: Encounter Diagnoses  Name Primary?  . Chest pressure Yes  . Cough   . COVID-19 virus infection   . Body aches      Plan: She is on day 15.  She did have the COVID-vaccine.  She is mostly improved.  There was some abnormality on lung fields today.  Continue to rest, hydrate, vitamins, add Anoro short-term sample to help with lung inflammation and chest pressure  No significant signs or symptoms to suggest pulmonary embolism  Discussed that fatigue, cough can linger for weeks after covid.  She does clinically seem much improved.  [place] was seen today for cough.  Diagnoses and all orders for this visit:  Chest pressure -     DG Chest 2 View; Future  Cough -     DG Chest 2 View; Future  COVID-19 virus infection -     DG Chest 2 View; Future  Body aches -     DG Chest 2 View; Future   Follow up: call report 1 wk.

## 2020-05-20 MED FILL — FLUCONAZOLE 150 MG TABS: 150 | 14 days supply | Qty: 2 | Fill #2

## 2020-06-08 ENCOUNTER — Other Ambulatory Visit: Payer: Self-pay | Admitting: Medical

## 2020-06-09 ENCOUNTER — Other Ambulatory Visit: Payer: Self-pay | Admitting: Medical

## 2020-06-09 MED FILL — DOXYCYCLINE HYCLATE 100 MG: 100 | 7 days supply | Qty: 14 | Fill #0

## 2020-06-10 MED FILL — FLUCONAZOLE 150 MG TABS: 150 | 14 days supply | Qty: 2 | Fill #0

## 2020-07-13 ENCOUNTER — Other Ambulatory Visit: Payer: Self-pay | Admitting: Medical

## 2020-07-13 ENCOUNTER — Other Ambulatory Visit (HOSPITAL_COMMUNITY): Payer: Self-pay

## 2020-07-13 MED FILL — Fluconazole Tab 150 MG: ORAL | 14 days supply | Qty: 2 | Fill #0 | Status: AC

## 2020-07-13 NOTE — Telephone Encounter (Signed)
Schedule recheck on antibiotic request and skin concerns

## 2020-07-13 NOTE — Telephone Encounter (Signed)
Is this ok to refill, reply to [facility]

## 2020-07-14 NOTE — Telephone Encounter (Signed)
Left pt a VM to call the office and schedule appt

## 2020-07-16 ENCOUNTER — Other Ambulatory Visit (HOSPITAL_COMMUNITY): Payer: Self-pay

## 2020-08-27 ENCOUNTER — Encounter: Payer: Self-pay | Admitting: Family Medicine

## 2020-08-27 ENCOUNTER — Telehealth (INDEPENDENT_AMBULATORY_CARE_PROVIDER_SITE_OTHER): Payer: 59 | Admitting: Family Medicine

## 2020-08-27 ENCOUNTER — Other Ambulatory Visit: Payer: Self-pay

## 2020-08-27 VITALS — Ht 64.0 in | Wt 209.0 lb

## 2020-08-27 DIAGNOSIS — K529 Noninfective gastroenteritis and colitis, unspecified: Secondary | ICD-10-CM

## 2020-08-27 DIAGNOSIS — J3489 Other specified disorders of nose and nasal sinuses: Secondary | ICD-10-CM | POA: Diagnosis not present

## 2020-08-27 NOTE — Patient Instructions (Addendum)
Take a COVID test today (and let us know if positive). Continue Zyrtec.  Start taking Flonase--2 gentle sniffs into each nostril once daily.  Stay well hydrated. You can use meclizine (Bonine, Dramamine-N) if needed for worsening vertigo, nausea related to equilibrium issues (don't use more than once daily if also taking zyrtec).  Follow a bland diet, advance as tolerated.  Avoid dairy and spicy/greasy/fatty foods.  You may need to contact [facility] due to your symptoms, depending on when you work next; you definitely will need to contact them if COVID test is positive.  Please seek re-evaluation if symptoms persist or worsen.

## 2020-08-27 NOTE — Progress Notes (Signed)
Start time: 10:30 End time: 10:53  Virtual Visit via Video Note  I connected with Jacqueline Winters on 08/27/20 by a video enabled telemedicine application and verified that I am speaking with the correct person using two identifiers.  Location: Patient: outside Provider: office   I discussed the limitations of evaluation and management by telemedicine and the availability of in person appointments. The patient expressed understanding and agreed to proceed.  History of Present Illness:  Chief Complaint  Patient presents with  . Diarrhea    VIRTUAL diarrhea and vomiting Sunday, subsided. Better today. Was having some loss of appetite and nausea but better today. Front of head is stuffy and "swimmy" feeling. Back of her sinuses has some some drainage. No fever. Her menstrual flow is very heavy.    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  The next morning, 5/15, she woke up feeling nauseated, vomited and had diarrhea. She vomited x 3, had 2 episodes of diarrhea. She didn't eat much that day.  Stools have remained slightly loose since then.  She has been staying hydrated, keeping fluids down.  She has a queasy feeling, improving, no further vomiting.  No fever or chills.  She has had some PND. Denies runny nose, sneezing or cough. She  Just did a 30 minute zumba class and tolerated it well.  She has a slight pressure, and feels foggy/swimmy-feeling across her forehead.  Feels llike equilibrium is a little off, no true vertigo. No lightheadedness. She takes zyrtec dailly, chronically.  Only uses Flonase sporadically, if nose feels stuffy. Not recently. She has heavy cycles. Lab Results  Component Value Date   HGB 11.6 02/03/2020    Had COVID in January, had a negative test in March. Hasn't tested with this illness. Had COVID vaccines x 3   PMH, PSH, SH reviewed.  Outpatient Encounter Medications as of 08/27/2020  Medication  Sig  . b complex vitamins capsule Take 1 capsule by mouth daily.  . cetirizine (ZYRTEC) 10 MG tablet Take 1 tablet (10 mg total) by mouth daily.  . cholecalciferol (VITAMIN D3) 25 MCG (1000 UNIT) tablet   . Multiple Vitamin (MULTIVITAMIN) tablet Take 1 tablet by mouth daily.  . Vitamin E 100 UNIT/GM CREA   . acetaminophen (TYLENOL) 500 MG tablet Take 1,000 mg by mouth every 6 (six) hours as needed. (Patient not taking: Reported on 08/27/2020)  . clindamycin-benzoyl peroxide (BENZACLIN) gel APPLY TO AFFECTED AREA TWICE A DAY (Patient not taking: No sig reported)  . fluconazole (DIFLUCAN) 150 MG tablet TAKE 1 TABLET (150 MG TOTAL) BY MOUTH ONCE A WEEK. ANTIBIOTIC INDUCED (Patient not taking: Reported on 08/27/2020)  . ibuprofen (ADVIL) 600 MG tablet Take 1 tablet (600 mg total) by mouth every 6 (six) hours as needed. (Patient not taking: Reported on 08/27/2020)  . [DISCONTINUED] doxycycline (VIBRA-TABS) 100 MG tablet Take 1 tablet (100 mg total) by mouth daily. (Patient not taking: No sig reported)  . [DISCONTINUED] doxycycline (VIBRA-TABS) 100 MG tablet TAKE 1 TABLET (100 MG TOTAL) BY MOUTH 2 (TWO) TIMES DAILY FOR RECURRENT BOILS  . [DISCONTINUED] fluconazole (DIFLUCAN) 100 MG tablet TAKE 1 TABLET BY MOUTH EVERY OTHER DAY (Patient not taking: No sig reported)   No facility-administered encounter medications on file as of 08/27/2020.   Allergies  Allergen Reactions  . Hydrocodone-Acetaminophen Nausea Only  . Vicodin [Hydrocodone-Acetaminophen]     uneasy   ROS:  No fever, chills, cough, shortness of  breath, chest pain.  +sinus pressure, dizziness, GI symptoms per HPI, improving. Denies abdominal pain. No rashes or other concerns, except as noted in HPI  Observations/Objective:  Ht 5\' 4"  (1.626 m)   Wt 209 lb (94.8 kg)   LMP 08/25/2020   BMI 35.87 kg/m   Well-appearing, pleasant female, in exercise clothes, outside (having just finished zumba).  She is in good spirits.  She is speaking  easily, in no distress Cranial nerves are grossly intact. No throat-clearing, coughing or sniffling during visit Exam is limited due to virtual nature of visit.  Assessment and Plan:  Gastroenteritis - episodes of n/v/d, now improved/improving. Encouraged COVID testing.  BRAT diet, bland, advance as tlerated, avoid dairy  Sinus pressure - start Flonase, continue antihistamine. COVID test recommended  Supportive measures reviewed, including diet, treatment of allergies, meclizine if needed. To f/u if symptoms persist or worsen. Discussed COVID testing, need to contact [facility]. Encouraged her to start Flonase.     Follow Up Instructions:    I discussed the assessment and treatment plan with the patient. The patient was provided an opportunity to ask questions and all were answered. The patient agreed with the plan and demonstrated an understanding of the instructions.   The patient was advised to call back or seek an in-person evaluation if the symptoms worsen or if the condition fails to improve as anticipated.  I spent 25 minutes dedicated to the care of this patient, including pre-visit review of records, face to face time, post-visit ordering of testing and documentation.    Vikki Ports, MD

## 2020-09-13 DIAGNOSIS — H6982 Other specified disorders of Eustachian tube, left ear: Secondary | ICD-10-CM | POA: Diagnosis not present

## 2020-11-02 ENCOUNTER — Telehealth: Payer: Self-pay | Admitting: Medical

## 2020-11-02 NOTE — Telephone Encounter (Signed)
Pt called and states that she needs a refill on her doxycyline states she normally has it on stand by for when she gets boils and sh has one now  She also states she needs diflucan to go with it Pt uses  [facility]

## 2020-11-03 ENCOUNTER — Other Ambulatory Visit (HOSPITAL_COMMUNITY): Payer: Self-pay

## 2020-11-03 ENCOUNTER — Other Ambulatory Visit: Payer: Self-pay | Admitting: Medical

## 2020-11-03 MED ORDER — FLUCONAZOLE 150 MG PO TABS
150.0000 mg | ORAL_TABLET | ORAL | 0 refills | Status: DC
  Filled 2020-11-03: qty 2, 14d supply, fill #0

## 2020-11-03 MED ORDER — DOXYCYCLINE HYCLATE 100 MG PO TABS
100.0000 mg | ORAL_TABLET | Freq: Two times a day (BID) | ORAL | 0 refills | Status: DC
  Filled 2020-11-03: qty 14, 7d supply, fill #0

## 2020-11-23 ENCOUNTER — Other Ambulatory Visit: Payer: Self-pay

## 2020-11-23 ENCOUNTER — Ambulatory Visit: Payer: 59 | Admitting: Medical

## 2020-11-23 VITALS — Temp 99.3°F | Wt 204.4 lb

## 2020-11-23 DIAGNOSIS — R55 Syncope and collapse: Secondary | ICD-10-CM | POA: Diagnosis not present

## 2020-11-23 DIAGNOSIS — F439 Reaction to severe stress, unspecified: Secondary | ICD-10-CM | POA: Insufficient documentation

## 2020-11-23 DIAGNOSIS — R002 Palpitations: Secondary | ICD-10-CM | POA: Insufficient documentation

## 2020-11-23 DIAGNOSIS — R42 Dizziness and giddiness: Secondary | ICD-10-CM | POA: Diagnosis not present

## 2020-11-23 NOTE — Progress Notes (Signed)
Subjective:  Jacqueline Winters is a 40 y.o. female who presents for Chief Complaint  Patient presents with   fainting episode    Fainting episode yesterday. Some shortness of breath and some chest pain. Husband said it was like a mimic of a seizure. Thinks she was out for like 3 minutes or so     Here with husband today for feeling lightheaded.  Yesterday she was coming back from [facility] riding as a passenger and her husband was driving.  At 1 point she passed out.  Husband thinks she was out for several seconds maybe even a minute or more.  She notes that before she passed out she felt lightheaded felt she needed there, asked her husband to roll the windows down.  He felt like her eyes rolled crossways and back in her head, she seemed to stop breathing shortly but then came to.  She was not confused when she came to.  She continues to feel a little dizzy and like she needed cool air.  Husband noted it was not hot in the car.    She notes that they were coming from the winery in [place].  She had had several mixed drinks there.  She had not eaten all day and did not drink any water.  The night before she also drank at least 7 mixed drinks along with not a lot of water.  They have been under a lot of stress so they were trying to have a we can get away.  She normally only drinks maybe 1 glass of wine per week so this was definitely more than her typical alcohol intake and she had not drink much water.    She also notes that due to recent stress and lack of sleep she has been taking some caffeine tablets.  Her mother-in-law's not doing well medically and they have been getting some phone calls in the middle the night about her situation which has awoken her.  They have 2 different family members that are having some major health issues right now which has been stressful.  She has not been sleeping well.  [place] sun [place] probably an hour and a  half  This sensation yesterday felt like passing out from being drunk of she does not get drunk regularly  She has been dealing with some eustachian tube issues recently.  She saw urgent care recently about eustachian tube issue was put on prednisone nasal spray.  She thinks she may have some hearing loss.    No other aggravating or relieving factors.    No other c/o.  The following portions of the patient's history were reviewed and updated as appropriate: allergies, current medications, past family history, past medical history, past social history, past surgical history and problem list.  ROS Otherwise as in subjective above  Objective: Temp 99.3 F (37.4 C)   Wt 204 lb 6.4 oz (92.7 kg)   SpO2 99%   BMI 35.09 kg/m   General appearance: alert, no distress, well developed, well nourished HEENT: normocephalic, sclerae anicteric, conjunctiva pink and moist, TMs pearly, nares patent, no discharge or erythema, pharynx normal Oral cavity: somewhat dry MM, no lesions Neck: supple, no lymphadenopathy, +general thyromegaly, no masses, no bruits Heart: RRR, normal S1, S2, no murmurs Lungs: CTA bilaterally, no wheezes, rhonchi, or rales Pulses: 2+ radial pulses, 2+ pedal pulses, normal cap refill Ext: no edema Neuro: CN II through XII intact,  nonfocal exam  EKG indication palpitations and syncope, rate 64 bpm, PR 130 ms, QRS 74 ms, QTC 416 ms, axis 49 degrees, sinus rhythm with premature supraventricular complex   Assessment: Encounter Diagnoses  Name Primary?   Syncope, unspecified syncope type Yes   Dizziness    Palpitation    Stress      Plan: Hearing test normal today  EKG reviewed.  PVC noted  Labs today for further evaluation  I suspect between a combination of dehydration, lack of good sleep recently, and the fact she is taking caffeine tablets and probably overdid it with alcohol this weekend, this may have led to her syncopal event  We will check electrolytes  and thyroid.  We discussed significantly hydrating up over the next few days, as well as getting good rest, and focusing on good sleep hygiene.  I advise she not take caffeine tablets.  No alcohol in the next week at least  We discussed some ways to deal with stress in general  Follow-up pending labs  [name] was seen today for fainting episode.  Diagnoses and all orders for this visit:  Syncope, unspecified syncope type -     EKG 12-Lead -     Comprehensive metabolic panel -     CBC with Differential/Platelet -     TSH  Dizziness -     EKG 12-Lead -     Comprehensive metabolic panel -     CBC with Differential/Platelet -     TSH  Palpitation -     EKG 12-Lead -     Comprehensive metabolic panel -     CBC with Differential/Platelet -     TSH  Stress -     EKG 12-Lead -     Comprehensive metabolic panel -     CBC with Differential/Platelet -     TSH    Follow up: pending labs

## 2020-11-24 LAB — COMPREHENSIVE METABOLIC PANEL
ALT: 15 IU/L (ref 0–32)
AST: 17 IU/L (ref 0–40)
Albumin/Globulin Ratio: 1.5 (ref 1.2–2.2)
Albumin: 4.4 g/dL (ref 3.8–4.8)
Alkaline Phosphatase: 60 IU/L (ref 44–121)
BUN/Creatinine Ratio: 14 (ref 9–23)
BUN: 9 mg/dL (ref 6–20)
Bilirubin Total: <0.2 mg/dL (ref 0.0–1.2)
CO2: 19 mmol/L — ABNORMAL LOW (ref 20–29)
Calcium: 8.4 mg/dL — ABNORMAL LOW (ref 8.7–10.2)
Chloride: 107 mmol/L — ABNORMAL HIGH (ref 96–106)
Creatinine, Ser: 0.66 mg/dL (ref 0.57–1.00)
Globulin, Total: 2.9 g/dL (ref 1.5–4.5)
Glucose: 98 mg/dL (ref 65–99)
Potassium: 4.3 mmol/L (ref 3.5–5.2)
Sodium: 141 mmol/L (ref 134–144)
Total Protein: 7.3 g/dL (ref 6.0–8.5)
eGFR: 114 mL/min/{1.73_m2} (ref >59)

## 2020-11-24 LAB — CBC WITH DIFFERENTIAL/PLATELET
Basophils Absolute: 0 10*3/uL (ref 0.0–0.2)
Basos: 0 %
EOS (ABSOLUTE): 0.2 10*3/uL (ref 0.0–0.4)
Eos: 2 %
Hematocrit: 37.8 % (ref 34.0–46.6)
Hemoglobin: 12.5 g/dL (ref 11.1–15.9)
Immature Grans (Abs): 0 10*3/uL (ref 0.0–0.1)
Immature Granulocytes: 0 %
Lymphocytes Absolute: 2.9 10*3/uL (ref 0.7–3.1)
Lymphs: 30 %
MCH: 28.1 pg (ref 26.6–33.0)
MCHC: 33.1 g/dL (ref 31.5–35.7)
MCV: 85 fL (ref 79–97)
Monocytes Absolute: 0.8 10*3/uL (ref 0.1–0.9)
Monocytes: 8 %
Neutrophils Absolute: 5.9 10*3/uL (ref 1.4–7.0)
Neutrophils: 60 %
Platelets: 322 10*3/uL (ref 150–450)
RBC: 4.45 x10E6/uL (ref 3.77–5.28)
RDW: 13.8 % (ref 11.7–15.4)
WBC: 9.8 10*3/uL (ref 3.4–10.8)

## 2020-11-24 LAB — TSH: TSH: 0.718 u[IU]/mL (ref 0.450–4.500)

## 2020-11-25 ENCOUNTER — Telehealth: Payer: Self-pay | Admitting: Medical

## 2020-11-25 ENCOUNTER — Other Ambulatory Visit: Payer: Self-pay | Admitting: Medical

## 2020-11-25 DIAGNOSIS — R42 Dizziness and giddiness: Secondary | ICD-10-CM

## 2020-11-25 DIAGNOSIS — R002 Palpitations: Secondary | ICD-10-CM

## 2020-11-25 DIAGNOSIS — R55 Syncope and collapse: Secondary | ICD-10-CM

## 2020-11-25 NOTE — Telephone Encounter (Signed)
Pt was notified that he is referring her to [facility] cardiology

## 2020-11-25 NOTE — Telephone Encounter (Signed)
Pt called and said she sent a mychart message buts she is still having palpitations. Pt is wondering if she needs to be referred to have a MRI of the chest and head

## 2020-11-30 ENCOUNTER — Ambulatory Visit (INDEPENDENT_AMBULATORY_CARE_PROVIDER_SITE_OTHER): Payer: 59

## 2020-11-30 ENCOUNTER — Ambulatory Visit (INDEPENDENT_AMBULATORY_CARE_PROVIDER_SITE_OTHER): Payer: 59 | Admitting: Cardiology

## 2020-11-30 ENCOUNTER — Other Ambulatory Visit: Payer: Self-pay

## 2020-11-30 ENCOUNTER — Encounter: Payer: Self-pay | Admitting: Cardiology

## 2020-11-30 VITALS — BP 90/70 | HR 60 | Ht 64.5 in | Wt 206.1 lb

## 2020-11-30 DIAGNOSIS — F17200 Nicotine dependence, unspecified, uncomplicated: Secondary | ICD-10-CM | POA: Diagnosis not present

## 2020-11-30 DIAGNOSIS — R002 Palpitations: Secondary | ICD-10-CM | POA: Diagnosis not present

## 2020-11-30 DIAGNOSIS — R55 Syncope and collapse: Secondary | ICD-10-CM

## 2020-11-30 NOTE — Progress Notes (Signed)
Cardiology Office Note:    Date:  11/30/2020   ID:  Jacqueline Winters, DOB 12-Mar-1981, MRN [id number]  PCP:  Carlena Hurl, PA-C   [facility] Providers Cardiologist:  None     Referring MD: Carlena Hurl, PA-C   Chief Complaint  Patient presents with   Other    Dizziness/Syncope/Palpitations. Meds reviewed verbally with pt.   Jacqueline Winters is a 40 y.o. female who is being seen today for the evaluation of palpitations at the request of Caryl Ada.   History of Present Illness:    Jacqueline Winters is a 40 y.o. female with a hx of cardiac murmur, GERD, current smoker x20+ years who presents due to syncope.  Patient was in a car with her husband about 8 days ago when she suddenly felt dizzy, flushed, heart palpitations prior to passing out.  Husband states she was unresponsive for about 2 to 3 minutes.  She suddenly woke up, felt a little confused but later became herself.  Over the next subsequent days, she has noticed dizziness, palpitations typically when she tries to exert himself.  Has not had any further episodes of syncope.  She is a current smoker.  Denies any prior episodes of syncope.  States having/feeling dizzy over the previous 2 days.  Did not have any stool or bowel incontinence.  Gets dizzy when she moves from sitting to standing, sometimes with walking.  He usually sits down to let symptoms pass.  Past Medical History:  Diagnosis Date   Allergy    GERD (gastroesophageal reflux disease)    with pregnancy   Goiter    Headache(784.0)    Heart murmur    Hidradenitis axillaris    Impaired fasting blood sugar    Insomnia    Obesity    PONV (postoperative nausea and vomiting)    Thyroid nodule    biopsy 07/2013, consult with Dr. Iran Planas endocrinology   Tobacco use disorder     Past Surgical History:  Procedure Laterality Date   CESAREAN SECTION  12/21/2011   Procedure: CESAREAN SECTION;  Surgeon: Farrel Gobble. Harrington Challenger, MD;  Location: [facility];   Service: Obstetrics;  Laterality: N/A;  Bilateral Tubal Ligation   CHOLECYSTECTOMY  2009   INCISION AND DRAINAGE     right axillary boils   PILONIDAL CYST / SINUS EXCISION  2008   [facility]   TUBAL LIGATION     VAGINAL DELIVERY  2002, 1998    Current Medications: Current Meds  Medication Sig   cholecalciferol (VITAMIN D3) 25 MCG (1000 UNIT) tablet    ELDERBERRY PO Take by mouth daily at 8 pm.   ibuprofen (ADVIL) 600 MG tablet Take 1 tablet (600 mg total) by mouth every 6 (six) hours as needed.   Magnesium 200 MG TABS Take by mouth in the morning and at bedtime.   Multiple Vitamin (MULTIVITAMIN) tablet Take 1 tablet by mouth daily.   Multiple Vitamins-Minerals (ZINC PO) Take by mouth daily in the afternoon.   Vitamin E 100 UNIT/GM CREA      Allergies:   Hydrocodone-acetaminophen and Vicodin [hydrocodone-acetaminophen]   Social History   Socioeconomic History   Marital status: Married    Spouse name: Not on file   Number of children: Not on file   Years of education: Not on file   Highest education level: Not on file  Occupational History   Occupation: nurse    Employer: [employer]  Tobacco Use  Smoking status: Some Days    Packs/day: 0.30    Years: 13.00    Pack years: 3.90    Types: Cigarettes   Smokeless tobacco: Never  Vaping Use   Vaping Use: Never used  Substance and Sexual Activity   Alcohol use: Yes    Comment: occasional   Drug use: No   Sexual activity: Yes    Partners: Male    Birth control/protection: Surgical  Other Topics Concern   Not on file  Social History Narrative   Married, 3 children.  Christian, exercises - zumba, fit camp.  Prior travel nursing.  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     Updated 01/2020   Social Determinants of Health   Financial Resource Strain: Not on file  Food Insecurity: Not on file  Transportation Needs: Not on file  Physical Activity: Not on file   Stress: Not on file  Social Connections: Not on file     Family History: The patient's family history includes Arthritis in her father; Cancer in her maternal aunt and maternal grandmother; Diabetes in her mother; Hypertension in her mother, sister, and sister. There is no history of Heart disease or Stroke.  ROS:   Please see the history of present illness.     All other systems reviewed and are negative.  EKGs/Labs/Other Studies Reviewed:    The following studies were reviewed today:   EKG:  EKG is  ordered today.  The ekg ordered today demonstrates normal sinus rhythm,  Recent Labs: 11/23/2020: ALT 15; BUN 9; Creatinine, Ser 0.66; Hemoglobin 12.5; Platelets 322; Potassium 4.3; Sodium 141; TSH 0.718  Recent Lipid Panel    Component Value Date/Time   CHOL 148 02/03/2020 1022   TRIG 50 02/03/2020 1022   HDL 64 02/03/2020 1022   CHOLHDL 2.3 02/03/2020 1022   CHOLHDL 2.3 08/17/2016 0823   VLDL 10 08/17/2016 0823   LDLCALC 73 02/03/2020 1022     Risk Assessment/Calculations:          Physical Exam:    VS:  BP 90/70 (BP Location: Left Arm, Patient Position: Sitting, Cuff Size: Normal)   Pulse 60   Ht 5' 4.5" (1.638 m)   Wt 206 lb 2 oz (93.5 kg)   SpO2 98%   BMI 34.83 kg/m     Wt Readings from Last 3 Encounters:  11/30/20 206 lb 2 oz (93.5 kg)  11/23/20 204 lb 6.4 oz (92.7 kg)  08/27/20 209 lb (94.8 kg)     GEN:  Well nourished, well developed in no acute distress HEENT: Normal NECK: No JVD; No carotid bruits LYMPHATICS: No lymphadenopathy CARDIAC: RRR, no murmurs, rubs, gallops RESPIRATORY:  Clear to auscultation without rales, wheezing or rhonchi  ABDOMEN: Soft, non-tender, non-distended MUSCULOSKELETAL:  No edema; No deformity  SKIN: Warm and dry NEUROLOGIC:  Alert and oriented x 3 PSYCHIATRIC:  Normal affect   ASSESSMENT:    1. Syncope and collapse   2. Palpitations   3. Smoking    PLAN:    In order of problems listed above:  Syncope,  etiology appears vasovagal with prodromal symptoms of flushing, blurry vision, dizziness, palpitations.  We will place a cardiac monitor to evaluate any significant arrhythmia, get echo to evaluate any structural abnormalities.  Orthostatic vitals did not show any evidence for orthostasis.  Symptoms could also be from positional vertigo.  Recommend neuro work-up as per PCP. Palpitations, cardiac monitor was removed. Current smoker, cessation advised.  Follow-up after echo and cardiac monitor.     Medication Adjustments/Labs and Tests Ordered: Current medicines are reviewed at length with the patient today.  Concerns regarding medicines are outlined above.  Orders Placed This Encounter  Procedures   LONG TERM MONITOR (3-14 DAYS)   EKG 12-Lead   ECHOCARDIOGRAM COMPLETE    No orders of the defined types were placed in this encounter.   Patient Instructions  Medication Instructions:  Your physician recommends that you continue on your current medications as directed. Please refer to the Current Medication list given to you today.  *If you need a refill on your cardiac medications before your next appointment, please call your pharmacy*   Lab Work: None ordered If you have labs (blood work) drawn today and your tests are completely normal, you will receive your results only by: [place] (if you have MyChart) OR A paper copy in the mail If you have any lab test that is abnormal or we need to change your treatment, we will call you to review the results.   Testing/Procedures:   Your physician has requested that you have an echocardiogram. Echocardiography is a painless test that uses sound waves to create images of your heart. It provides your doctor with information about the size and shape of your heart and how well your heart's chambers and valves are working. This procedure takes approximately one hour. There are no restrictions for this procedure.  2.    Your physician has  recommended that you wear a Zio XT monitor for 2 weeks.   This monitor is a medical device that records the heart's electrical activity. Doctors most often use these monitors to diagnose arrhythmias. Arrhythmias are problems with the speed or rhythm of the heartbeat. The monitor is a small device applied to your chest. You can wear one while you do your normal daily activities. While wearing this monitor if you have any symptoms to push the button and record what you felt. Once you have worn this monitor for the period of time provider prescribed (Usually 14 days), you will return the monitor device in the postage paid box. Once it is returned they will download the data collected and provide [place] with a report which the provider will then review and we will call you with those results. Important tips:  Avoid showering during the first 24 hours of wearing the monitor. Avoid excessive sweating to help maximize wear time. Do not submerge the device, no hot tubs, and no swimming pools. Keep any lotions or oils away from the patch. After 24 hours you may shower with the patch on. Take brief showers with your back facing the shower head.  Do not remove patch once it has been placed because that will interrupt data and decrease adhesive wear time. Push the button when you have any symptoms and write down what you were feeling. Once you have completed wearing your monitor, remove and place into box which has postage paid and place in your outgoing mailbox.  If for some reason you have misplaced your box then call our office and we can provide another box and/or mail it off for you.     Follow-Up: At [facility], you and your health needs are our priority.  As part of our continuing mission to provide you with exceptional heart care, we have created designated Provider Care Teams.  These Care Teams include your primary Cardiologist (physician) and Advanced Practice Providers (APPs -  Physician Assistants  and  Nurse Practitioners) who all work together to provide you with the care you need, when you need it.  We recommend signing up for the patient portal called "MyChart".  Sign up information is provided on this After Visit Summary.  MyChart is used to connect with patients for Virtual Visits (Telemedicine).  Patients are able to view lab/test results, encounter notes, upcoming appointments, etc.  Non-urgent messages can be sent to your provider as well.   To learn more about what you can do with MyChart, go to [email or web address].    Your next appointment:   6 week(s)  The format for your next appointment:   In Person  Provider:   You may see Dr. Garen Lah or one of the following Advanced Practice Providers on your designated Care Team:   Murray Hodgkins, NP Christell Faith, PA-C Marrianne Mood, PA-C Cadence Dexter, [place]   Other Instructions    Signed, Kate Sable, MD  11/30/2020 5:15 PM    [place]

## 2020-11-30 NOTE — Patient Instructions (Signed)
Medication Instructions:  Your physician recommends that you continue on your current medications as directed. Please refer to the Current Medication list given to you today.  *If you need a refill on your cardiac medications before your next appointment, please call your pharmacy*   Lab Work: None ordered If you have labs (blood work) drawn today and your tests are completely normal, you will receive your results only by: [place] (if you have MyChart) OR A paper copy in the mail If you have any lab test that is abnormal or we need to change your treatment, we will call you to review the results.   Testing/Procedures:   Your physician has requested that you have an echocardiogram. Echocardiography is a painless test that uses sound waves to create images of your heart. It provides your doctor with information about the size and shape of your heart and how well your heart's chambers and valves are working. This procedure takes approximately one hour. There are no restrictions for this procedure.  2.    Your physician has recommended that you wear a Zio XT monitor for 2 weeks.   This monitor is a medical device that records the heart's electrical activity. Doctors most often use these monitors to diagnose arrhythmias. Arrhythmias are problems with the speed or rhythm of the heartbeat. The monitor is a small device applied to your chest. You can wear one while you do your normal daily activities. While wearing this monitor if you have any symptoms to push the button and record what you felt. Once you have worn this monitor for the period of time provider prescribed (Usually 14 days), you will return the monitor device in the postage paid box. Once it is returned they will download the data collected and provide [place] with a report which the provider will then review and we will call you with those results. Important tips:  Avoid showering during the first 24 hours of wearing the  monitor. Avoid excessive sweating to help maximize wear time. Do not submerge the device, no hot tubs, and no swimming pools. Keep any lotions or oils away from the patch. After 24 hours you may shower with the patch on. Take brief showers with your back facing the shower head.  Do not remove patch once it has been placed because that will interrupt data and decrease adhesive wear time. Push the button when you have any symptoms and write down what you were feeling. Once you have completed wearing your monitor, remove and place into box which has postage paid and place in your outgoing mailbox.  If for some reason you have misplaced your box then call our office and we can provide another box and/or mail it off for you.     Follow-Up: At [facility], you and your health needs are our priority.  As part of our continuing mission to provide you with exceptional heart care, we have created designated Provider Care Teams.  These Care Teams include your primary Cardiologist (physician) and Advanced Practice Providers (APPs -  Physician Assistants and Nurse Practitioners) who all work together to provide you with the care you need, when you need it.  We recommend signing up for the patient portal called "MyChart".  Sign up information is provided on this After Visit Summary.  MyChart is used to connect with patients for Virtual Visits (Telemedicine).  Patients are able to view lab/test results, encounter notes, upcoming appointments, etc.  Non-urgent messages can be sent to your provider  as well.   To learn more about what you can do with MyChart, go to [email or web address].    Your next appointment:   6 week(s)  The format for your next appointment:   In Person  Provider:   You may see Dr. Garen Lah or one of the following Advanced Practice Providers on your designated Care Team:   Murray Hodgkins, NP Christell Faith, PA-C Marrianne Mood, PA-C Cadence Kathlen Mody, [place]   Other  Instructions

## 2020-12-02 ENCOUNTER — Other Ambulatory Visit: Payer: Self-pay | Admitting: Medical

## 2020-12-02 DIAGNOSIS — R55 Syncope and collapse: Secondary | ICD-10-CM

## 2020-12-02 DIAGNOSIS — R42 Dizziness and giddiness: Secondary | ICD-10-CM

## 2020-12-02 DIAGNOSIS — R270 Ataxia, unspecified: Secondary | ICD-10-CM

## 2020-12-03 ENCOUNTER — Telehealth: Payer: Self-pay | Admitting: Cardiology

## 2020-12-03 NOTE — Telephone Encounter (Signed)
Received forms from [employer].  Patient notified and will stop by office to complete ROI and $29 payment .

## 2020-12-07 ENCOUNTER — Telehealth: Payer: Self-pay | Admitting: Medical

## 2020-12-07 DIAGNOSIS — R42 Dizziness and giddiness: Secondary | ICD-10-CM

## 2020-12-07 DIAGNOSIS — R55 Syncope and collapse: Secondary | ICD-10-CM

## 2020-12-07 NOTE — Telephone Encounter (Signed)
Pt called and asked if we received FMLA papers for her from [employer]. Please advise pt at [phone].

## 2020-12-08 NOTE — Telephone Encounter (Signed)
Sent to patient  Pt has imagings scheduled already

## 2020-12-18 ENCOUNTER — Telehealth: Payer: Self-pay | Admitting: Medical

## 2020-12-18 NOTE — Telephone Encounter (Signed)
Pt called she is having MRI or brain and neck tomorrow.  She is wanting to know if you can add on to do soft tissue of neck also since she has thyroid issues.  Apparently she spoke to someone at MRI facility and they were talking to her about this

## 2020-12-19 ENCOUNTER — Ambulatory Visit
Admission: RE | Admit: 2020-12-19 | Discharge: 2020-12-19 | Disposition: A | Discharge status: Home / Self Care | Payer: 59 | Source: Ambulatory Visit | Attending: Medical | Admitting: Medical

## 2020-12-19 ENCOUNTER — Other Ambulatory Visit: Payer: Self-pay

## 2020-12-19 DIAGNOSIS — R42 Dizziness and giddiness: Secondary | ICD-10-CM | POA: Diagnosis not present

## 2020-12-19 DIAGNOSIS — R29818 Other symptoms and signs involving the nervous system: Secondary | ICD-10-CM | POA: Diagnosis not present

## 2020-12-19 DIAGNOSIS — R55 Syncope and collapse: Secondary | ICD-10-CM

## 2020-12-19 DIAGNOSIS — R270 Ataxia, unspecified: Secondary | ICD-10-CM

## 2020-12-19 DIAGNOSIS — S0990XA Unspecified injury of head, initial encounter: Secondary | ICD-10-CM | POA: Diagnosis not present

## 2020-12-19 DIAGNOSIS — R410 Disorientation, unspecified: Secondary | ICD-10-CM | POA: Diagnosis not present

## 2020-12-22 DIAGNOSIS — R55 Syncope and collapse: Secondary | ICD-10-CM | POA: Diagnosis not present

## 2020-12-22 NOTE — Telephone Encounter (Signed)
Pt coming in next week to discuss this

## 2020-12-24 ENCOUNTER — Ambulatory Visit (INDEPENDENT_AMBULATORY_CARE_PROVIDER_SITE_OTHER): Payer: 59

## 2020-12-24 ENCOUNTER — Other Ambulatory Visit: Payer: Self-pay

## 2020-12-24 DIAGNOSIS — R002 Palpitations: Secondary | ICD-10-CM

## 2020-12-24 DIAGNOSIS — R55 Syncope and collapse: Secondary | ICD-10-CM | POA: Diagnosis not present

## 2020-12-25 ENCOUNTER — Other Ambulatory Visit: Payer: Self-pay | Admitting: Medical

## 2020-12-25 ENCOUNTER — Other Ambulatory Visit (HOSPITAL_COMMUNITY): Payer: Self-pay

## 2020-12-25 LAB — ECHOCARDIOGRAM COMPLETE
AR max vel: 2.55 cm2
AV Area VTI: 2.41 cm2
AV Area mean vel: 2.17 cm2
AV Mean grad: 4 mmHg
AV Peak grad: 7.6 mmHg
Ao pk vel: 1.38 m/s
Area-P 1/2: 3.37 cm2
Calc EF: 70.1 %
S' Lateral: 3.2 cm
Single Plane A2C EF: 70.7 %
Single Plane A4C EF: 68.3 %

## 2020-12-25 NOTE — Telephone Encounter (Signed)
[facility] is requesting to fill pt doxy and diflucan. Please advise . Looks as if both have been D/C. [place]

## 2020-12-27 NOTE — Telephone Encounter (Signed)
Is she requesting this refill?

## 2020-12-28 ENCOUNTER — Other Ambulatory Visit (HOSPITAL_COMMUNITY): Payer: Self-pay

## 2020-12-28 ENCOUNTER — Other Ambulatory Visit: Payer: Self-pay

## 2020-12-28 ENCOUNTER — Ambulatory Visit: Payer: 59 | Admitting: Medical

## 2020-12-28 VITALS — BP 104/70 | HR 90 | Wt 203.6 lb

## 2020-12-28 DIAGNOSIS — R5382 Chronic fatigue, unspecified: Secondary | ICD-10-CM

## 2020-12-28 DIAGNOSIS — R202 Paresthesia of skin: Secondary | ICD-10-CM

## 2020-12-28 DIAGNOSIS — M6281 Muscle weakness (generalized): Secondary | ICD-10-CM

## 2020-12-28 DIAGNOSIS — R42 Dizziness and giddiness: Secondary | ICD-10-CM | POA: Diagnosis not present

## 2020-12-28 MED ORDER — FLUCONAZOLE 150 MG PO TABS
150.0000 mg | ORAL_TABLET | ORAL | 0 refills | Status: DC
  Filled 2020-12-28: qty 2, 14d supply, fill #0

## 2020-12-28 MED ORDER — DOXYCYCLINE HYCLATE 100 MG PO TABS
100.0000 mg | ORAL_TABLET | Freq: Two times a day (BID) | ORAL | 0 refills | Status: DC
  Filled 2020-12-28: qty 14, 7d supply, fill #0

## 2020-12-28 NOTE — Progress Notes (Signed)
Subjective: Chief Complaint  Patient presents with   discuss results    Discuss MRI and symptoms and FMLA forms   Here for follow-up.  I saw her in August for lightheaded and syncope symptoms.  At that time we referred to cardiology.  She had event monitoring and echocardiogram.  She has follow-up with cardiology again on October 3.  Initially she called back and did not feel like cardiology felt there was a cardiac issue to be concerned about.  She requested a referral to neurology to rule out other causes.  We have made that referral but she has not seen them yet.  Her appointment for neurology is on October 10.  She ended up having brain MRI and MRA of neck after her initial visit here.  She is here to discuss those results as well  She notes that she continues to have symptoms of concerns.  Lately her symptoms continue to be fatigued, and an elevated heart rate.  She notes that if she does anything active her heart rate jumps up fast and she gets shortness of breath and lightheadedness.  Due to this she is scared to go to work and pass out.  She has not been back to work since her visit here in August.  At times she feels weak in her extremities, occasionally she will get tingling in her fingertips.  No vision or hearing loss.   No incontinence.  She wanted about stress and anxiety but she has been using things like meditation and wrist since last visit but she still has the symptoms.  She knows to cardiology mention runs of SVT and PACs on the event monitor.  She denies fasting or skipping meals.  Heart rate can get up to around 140 with activity quickly, quicker than she thinks it should.  When she was here in August with her husband she had reported an episode of syncope that was witnessed by husband. At the time her husband follow-up she was out for several seconds maybe even a minute or more.  She notes that before she passed out she felt lightheaded  She was not confused when she came  to.  She continues to feel a little dizzy and like she needed cool air.  Husband noted it was not hot in the car.    She feels a fullness in her neck.  She felt a lump under her chin recently.  It seems to be gone or better now.  No other aggravating or relieving factors.    No other c/o.  The following portions of the patient's history were reviewed and updated as appropriate: allergies, current medications, past family history, past medical history, past social history, past surgical history and problem list.  ROS Otherwise as in subjective above   Objective: BP 104/70   Pulse 90   Wt 203 lb 9.6 oz (92.4 kg)   BMI 34.41 kg/m   General appearance: alert, no distress, well developed, well nourished Oral cavity: somewhat dry MM, no lesions Neck: supple, no lymphadenopathy, +general thyromegaly, no masses, no bruits Heart: RRR, normal S1, S2, no murmurs Lungs: CTA bilaterally, no wheezes, rhonchi, or rales Pulses: 2+ radial pulses, 2+ pedal pulses, normal cap refill Ext: no edema Neuro: CN II through XII intact, nonfocal exam  MRI brain 12/19/20 IMPRESSION: Unremarkable non-contrast MRI appearance of the brain. No evidence of acute intracranial abnormality.  MRA angio neck 12/19/20 IMPRESSION: Negative MRA of the neck with wide patency of both carotid artery systems  and vertebral arteries. No hemodynamically significant stenosis or other significant vascular abnormality.   Assessment: Encounter Diagnoses  Name Primary?   Lightheaded Yes   Chronic fatigue    Paresthesia    Muscle weakness      Plan: I reviewed her her recent visit with cardiology Dr. Kate Sable from August.  I reviewed her recent echocardiogram and notations from cardiology.  There were some runs of PACs when she felt an event and pressed the button on the event monitor.  At this point she will follow-up with cardiology and neurology.  She feels unsafe to go back to work for fear of another  syncopal event.  I advised that I would consult with the cardiologist to see what their impression is and recommendations.  I have not specifically taken her out of work.  We will check some additional labs as below.  We discussed the recent MRI/MRA findings which were normal.  No signs of mass, hydrocephalus, aneurysm or other abnormality.  No signs of MS.  I recommend she not fast and continue good hydration.  I recommend she establish with counseling.  She has access to counseling free through employee assistance program.  I asked her to get an appointment with them right away.  I have her FMLA forms.  I will reach out to cardiology before deciding how to complete the forms   Archana was seen today for discuss results.  Diagnoses and all orders for this visit:  Lightheaded -     Basic metabolic panel -     Vitamin B12 -     Sedimentation rate  Chronic fatigue -     Basic metabolic panel -     Vitamin B12 -     Sedimentation rate  Paresthesia -     Basic metabolic panel -     Vitamin B12 -     Sedimentation rate  Muscle weakness -     Basic metabolic panel -     Vitamin B12 -     Sedimentation rate   Follow up: pending labs

## 2020-12-29 ENCOUNTER — Other Ambulatory Visit (HOSPITAL_COMMUNITY): Payer: Self-pay

## 2020-12-29 LAB — BASIC METABOLIC PANEL
BUN/Creatinine Ratio: 16 (ref 9–23)
BUN: 14 mg/dL (ref 6–20)
CO2: 24 mmol/L (ref 20–29)
Calcium: 9.5 mg/dL (ref 8.7–10.2)
Chloride: 104 mmol/L (ref 96–106)
Creatinine, Ser: 0.88 mg/dL (ref 0.57–1.00)
Glucose: 81 mg/dL (ref 65–99)
Potassium: 4.2 mmol/L (ref 3.5–5.2)
Sodium: 142 mmol/L (ref 134–144)
eGFR: 86 mL/min/{1.73_m2} (ref >59)

## 2020-12-29 LAB — VITAMIN B12: Vitamin B-12: 1335 pg/mL — ABNORMAL HIGH (ref 232–1245)

## 2020-12-29 LAB — SEDIMENTATION RATE: Sed Rate: 21 mm/hr (ref 0–32)

## 2021-01-07 ENCOUNTER — Ambulatory Visit: Payer: 59 | Admitting: Cardiology

## 2021-01-11 ENCOUNTER — Other Ambulatory Visit: Payer: Self-pay

## 2021-01-11 ENCOUNTER — Encounter: Payer: Self-pay | Admitting: Cardiology

## 2021-01-11 ENCOUNTER — Ambulatory Visit (INDEPENDENT_AMBULATORY_CARE_PROVIDER_SITE_OTHER): Payer: 59 | Admitting: Cardiology

## 2021-01-11 VITALS — BP 106/80 | HR 82 | Ht 64.5 in | Wt 210.0 lb

## 2021-01-11 DIAGNOSIS — R42 Dizziness and giddiness: Secondary | ICD-10-CM

## 2021-01-11 DIAGNOSIS — R55 Syncope and collapse: Secondary | ICD-10-CM | POA: Diagnosis not present

## 2021-01-11 NOTE — Progress Notes (Signed)
Cardiology Office Note:    Date:  01/11/2021   ID:  Jacqueline Winters, DOB [birth year], MRN [id number]  PCP:  Carlena Hurl, PA-C   [facility] Providers Cardiologist:  None     Referring MD: Carlena Hurl, PA-C   Chief Complaint  Patient presents with   Follow-up    F/U after cardiac testing     History of Present Illness:    Jacqueline Winters is a 40 y.o. female with a hx of cardiac murmur, GERD, former smoker x20+ years who presents for follow-up.  Patient was previously seen due to syncope.  Symptoms were probably vasovagal in etiology.  Echocardiogram and cardiac monitor was placed to evaluate any cardiac etiology.  She states symptoms overall have improved although she occasionally feels dizzy with changes in positioning such as bending.  Has not had any further episodes of passing out.  Previous episode of passing out was associated with prodromal symptoms.  She feels okay, has an appointment with neurology next week.  Has no new concerns at this time.   Past Medical History:  Diagnosis Date   Allergy    GERD (gastroesophageal reflux disease)    with pregnancy   Goiter    Headache(784.0)    Heart murmur    Hidradenitis axillaris    Impaired fasting blood sugar    Insomnia    Obesity    PONV (postoperative nausea and vomiting)    Thyroid nodule    biopsy 07/2013, consult with Dr. Iran Planas endocrinology   Tobacco use disorder     Past Surgical History:  Procedure Laterality Date   CESAREAN SECTION  12/21/2011   Procedure: CESAREAN SECTION;  Surgeon: Farrel Gobble. Harrington Challenger, MD;  Location: [facility];  Service: Obstetrics;  Laterality: N/A;  Bilateral Tubal Ligation   CHOLECYSTECTOMY  2009   INCISION AND DRAINAGE     right axillary boils   PILONIDAL CYST / SINUS EXCISION  2008   [facility]   TUBAL LIGATION     VAGINAL DELIVERY  2002, 1998    Current Medications: Current Meds  Medication Sig   cholecalciferol (VITAMIN D3) 25 MCG (1000 UNIT) tablet  Take by mouth daily.   ibuprofen (ADVIL) 600 MG tablet Take 1 tablet (600 mg total) by mouth every 6 (six) hours as needed.   Magnesium 200 MG TABS Take 250 mg by mouth daily.   Multiple Vitamin (MULTIVITAMIN) tablet Take 1 tablet by mouth daily.   Multiple Vitamins-Minerals (ZINC PO) Take by mouth daily in the afternoon.   Omega-3 Fatty Acids (FISH OIL OMEGA-3 PO) Take 1 capsule by mouth daily.   VITAMIN E PO Take 1 capsule by mouth daily.     Allergies:   Hydrocodone-acetaminophen and Vicodin [hydrocodone-acetaminophen]   Social History   Socioeconomic History   Marital status: Married    Spouse name: Not on file   Number of children: Not on file   Years of education: Not on file   Highest education level: Not on file  Occupational History   Occupation: nurse    Employer: [employer]  Tobacco Use   Smoking status: Some Days    Packs/day: 0.30    Years: 13.00    Pack years: 3.90    Types: Cigarettes   Smokeless tobacco: Never  Vaping Use   Vaping Use: Never used  Substance and Sexual Activity   Alcohol use: Yes    Comment: occasional   Drug use: No   Sexual activity: Yes  Partners: Male    Birth control/protection: Surgical  Other Topics Concern   Not on file  Social History Narrative   Married, 3 children.  Christian, exercises - zumba, fit camp.  Prior travel nursing.  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     Updated 01/2020   Social Determinants of Health   Financial Resource Strain: Not on file  Food Insecurity: Not on file  Transportation Needs: Not on file  Physical Activity: Not on file  Stress: Not on file  Social Connections: Not on file     Family History: The patient's family history includes Arthritis in her father; Cancer in her maternal aunt and maternal grandmother; Diabetes in her mother; Hypertension in her mother, sister, and sister. There is no history of Heart disease or Stroke.  ROS:    Please see the history of present illness.     All other systems reviewed and are negative.  EKGs/Labs/Other Studies Reviewed:    The following studies were reviewed today:   EKG:  EKG not ordered today.    Recent Labs: 11/23/2020: ALT 15; Hemoglobin 12.5; Platelets 322; TSH 0.718 12/28/2020: BUN 14; Creatinine, Ser 0.88; Potassium 4.2; Sodium 142  Recent Lipid Panel    Component Value Date/Time   CHOL 148 02/03/2020 1022   TRIG 50 02/03/2020 1022   HDL 64 02/03/2020 1022   CHOLHDL 2.3 02/03/2020 1022   CHOLHDL 2.3 08/17/2016 0823   VLDL 10 08/17/2016 0823   LDLCALC 73 02/03/2020 1022     Risk Assessment/Calculations:          Physical Exam:    VS:  BP 106/80 (BP Location: Left Arm, Patient Position: Sitting, Cuff Size: Large)   Pulse 82   Ht 5' 4.5" (1.638 m)   Wt 210 lb (95.3 kg)   SpO2 99%   BMI 35.49 kg/m     Wt Readings from Last 3 Encounters:  01/11/21 210 lb (95.3 kg)  12/28/20 203 lb 9.6 oz (92.4 kg)  11/30/20 206 lb 2 oz (93.5 kg)     GEN:  Well nourished, well developed in no acute distress HEENT: Normal NECK: No JVD; No carotid bruits LYMPHATICS: No lymphadenopathy CARDIAC: RRR, no murmurs, rubs, gallops RESPIRATORY:  Clear to auscultation without rales, wheezing or rhonchi  ABDOMEN: Soft, non-tender, non-distended MUSCULOSKELETAL:  No edema; No deformity  SKIN: Warm and dry NEUROLOGIC:  Alert and oriented x 3 PSYCHIATRIC:  Normal affect   ASSESSMENT:    1. Syncope and collapse   2. Dizziness     PLAN:    In order of problems listed above:  Syncope, etiology likely vasovagal with prodromal symptoms including blurry vision, dizziness.  Echo with normal EF 60 to 65%, no gross structural abnormalities.cardiac monitor with no significant arrhythmias to suggest etiology of syncope.  Patient made aware, reassured. Dizziness, associated with bending.  Consistent with positional vertigo.  Recommend follow-up with PCP, consider trial of Antivert  if deemed appropriate.  Follow-up as needed.     Medication Adjustments/Labs and Tests Ordered: Current medicines are reviewed at length with the patient today.  Concerns regarding medicines are outlined above.  No orders of the defined types were placed in this encounter.   No orders of the defined types were placed in this encounter.   Patient Instructions  Medication Instructions:  Your physician recommends that you continue on your current medications as directed. Please refer to the Current Medication list given to you today.  *  If you need a refill on your cardiac medications before your next appointment, please call your pharmacy*   Lab Work: None ordered If you have labs (blood work) drawn today and your tests are completely normal, you will receive your results only by: [place] (if you have MyChart) OR A paper copy in the mail If you have any lab test that is abnormal or we need to change your treatment, we will call you to review the results.   Testing/Procedures: None ordered   Follow-Up: At [facility], you and your health needs are our priority.  As part of our continuing mission to provide you with exceptional heart care, we have created designated Provider Care Teams.  These Care Teams include your primary Cardiologist (physician) and Advanced Practice Providers (APPs -  Physician Assistants and Nurse Practitioners) who all work together to provide you with the care you need, when you need it.  We recommend signing up for the patient portal called "MyChart".  Sign up information is provided on this After Visit Summary.  MyChart is used to connect with patients for Virtual Visits (Telemedicine).  Patients are able to view lab/test results, encounter notes, upcoming appointments, etc.  Non-urgent messages can be sent to your provider as well.   To learn more about what you can do with MyChart, go to [email or web address].    Your next appointment:    Follow up as needed   The format for your next appointment:   In Person  Provider:   Kate Sable, MD   Other Instructions    Signed, Kate Sable, MD  01/11/2021 5:05 PM    [place]

## 2021-01-11 NOTE — Patient Instructions (Signed)

## 2021-01-18 ENCOUNTER — Encounter: Payer: Self-pay | Admitting: Neurology

## 2021-01-18 ENCOUNTER — Ambulatory Visit (INDEPENDENT_AMBULATORY_CARE_PROVIDER_SITE_OTHER): Payer: 59 | Admitting: Neurology

## 2021-01-18 VITALS — BP 101/69 | HR 74 | Ht 64.0 in | Wt 206.0 lb

## 2021-01-18 DIAGNOSIS — R55 Syncope and collapse: Secondary | ICD-10-CM | POA: Diagnosis not present

## 2021-01-18 NOTE — Patient Instructions (Signed)
Routine EEG  Return if worse

## 2021-01-18 NOTE — Progress Notes (Signed)
[facility]  PATIENT: Jacqueline Winters DOB: [birth year]  REFERRING CLINICIAN: Carlena Hurl, PA-C HISTORY FROM: Patient  REASON FOR VISIT: Syncope/Dizziness    HISTORICAL  CHIEF COMPLAINT:  Chief Complaint  Patient presents with   New Patient (Initial Visit)    Rm 12, alone, here to discuss dizziness, elevated heart rate , started in jan 2022    HISTORY OF PRESENT ILLNESS:  This is a 40 year old woman with past medical history of heart murmur, chronic sinusitis who is presenting after a a syncopal episode on August 14.  Patient said on that day she was sitting in the passenger seat, husband was driving, she suddenly felt hot and dizzy, then passed out. Husband had to pull on the side of the road and beat on her chest to wake her up.  She reported entire episode lasted about 3 minutes.  Afterward, she was tired but she knew that she was in the car and her husband was driving.  She reported at on that day they were coming from a winery and she had 3 glasses of wine.  She denies any previous history of syncope and since then she has not had any syncopal episode.  However she said in January she was diagnosed with COVID at that time she was struggling with dizziness and increased heart rate.  Since then she had episodes of dizzy spell in May and June.  She described the dizziness as lightheadedness and worse when bending down.  She tried prednisone, change her antiallergy medication from Claritin, to Zyrtec to Zocor and back to Claritin.  She also report a occasion she will have floaters.  Denies any headaches.  She has seen cardiologist, had a heart monitor and echocardiogram was told everything was normal except for a very mild diastolic dysfunction.  Sleep is okay, she reports about 5 to 7 hours sleep per day.  Currently her stress level is very high, her mother-in-law is in the ICU diagnosed with heart failure.  She also reports on the week of August 14 she was under a  lot of stress.  She had a family member who was in the ICU under ventilation support. 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. She also started exercising again, and stopped smoking.    OTHER MEDICAL CONDITIONS: Heart murmur, sinusitis    REVIEW OF SYSTEMS: Full 14 system review of systems performed and negative with exception of: as noted in the HPI.   ALLERGIES: Allergies  Allergen Reactions   Hydrocodone-Acetaminophen Nausea Only   Vicodin [Hydrocodone-Acetaminophen]     uneasy    HOME MEDICATIONS: Outpatient Medications Prior to Visit  Medication Sig Dispense Refill   cholecalciferol (VITAMIN D3) 25 MCG (1000 UNIT) tablet Take by mouth daily.     ibuprofen (ADVIL) 600 MG tablet Take 1 tablet (600 mg total) by mouth every 6 (six) hours as needed. 30 tablet 0   Magnesium 200 MG TABS Take 250 mg by mouth daily.     Multiple Vitamin (MULTIVITAMIN) tablet Take 1 tablet by mouth daily.     Multiple Vitamins-Minerals (ZINC PO) Take by mouth daily in the afternoon.     Omega-3 Fatty Acids (FISH OIL OMEGA-3 PO) Take 1 capsule by mouth daily.     VITAMIN E PO Take 1 capsule by mouth daily.     No facility-administered medications prior to visit.    PAST MEDICAL HISTORY: Past Medical History:  Diagnosis Date   Allergy    GERD (  gastroesophageal reflux disease)    with pregnancy   Goiter    Headache(784.0)    Heart murmur    Hidradenitis axillaris    Impaired fasting blood sugar    Insomnia    Obesity    PONV (postoperative nausea and vomiting)    Thyroid nodule    biopsy 07/2013, consult with Dr. Iran Planas endocrinology   Tobacco use disorder     PAST SURGICAL HISTORY: Past Surgical History:  Procedure Laterality Date   CESAREAN SECTION  12/21/2011   Procedure: CESAREAN SECTION;  Surgeon: Farrel Gobble. Harrington Challenger, MD;  Location: [facility];  Service: Obstetrics;  Laterality: N/A;  Bilateral Tubal Ligation   CHOLECYSTECTOMY  2009   INCISION AND DRAINAGE     right  axillary boils   PILONIDAL CYST / SINUS EXCISION  2008   [facility]   TUBAL LIGATION     VAGINAL DELIVERY  2002, 1998    FAMILY HISTORY: Family History  Problem Relation Age of Onset   Hypertension Mother    Diabetes Mother    Hypertension Sister    Cancer Maternal Grandmother        colon, ovarian   Hypertension Sister    Arthritis Father    Cancer Maternal Aunt        breast   Heart disease Neg Hx    Stroke Neg Hx     SOCIAL HISTORY: Social History   Socioeconomic History   Marital status: Married    Spouse name: Not on file   Number of children: Not on file   Years of education: Not on file   Highest education level: Not on file  Occupational History   Occupation: nurse    Employer: [employer]  Tobacco Use   Smoking status: Some Days    Packs/day: 0.30    Years: 13.00    Pack years: 3.90    Types: Cigarettes   Smokeless tobacco: Never  Vaping Use   Vaping Use: Never used  Substance and Sexual Activity   Alcohol use: Yes    Comment: occasional   Drug use: No   Sexual activity: Yes    Partners: Male    Birth control/protection: Surgical  Other Topics Concern   Not on file  Social History Narrative   Married, 3 children.  Christian, exercises - zumba, fit camp.  Prior travel nursing.  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     Updated 01/2020   Social Determinants of Health   Financial Resource Strain: Not on file  Food Insecurity: Not on file  Transportation Needs: Not on file  Physical Activity: Not on file  Stress: Not on file  Social Connections: Not on file  Intimate Partner Violence: Not on file     PHYSICAL EXAM  GENERAL EXAM/CONSTITUTIONAL: Vitals:  Vitals:   01/18/21 0828  BP: 101/69  Pulse: 74  Weight: 206 lb (93.4 kg)  Height: 5\' 4"  (1.626 m)   Body mass index is 35.36 kg/m. Wt Readings from Last 3 Encounters:  01/18/21 206 lb (93.4 kg)  01/11/21 210 lb (95.3  kg)  12/28/20 203 lb 9.6 oz (92.4 kg)   Patient is in no distress; well developed, nourished and groomed; neck is supple  EYES: Pupils round and reactive to light, Visual fields full to confrontation, Extraocular movements intacts,   MUSCULOSKELETAL: Gait, strength, tone, movements noted in Neurologic exam below  NEUROLOGIC: MENTAL STATUS:  No flowsheet data found. awake,  alert, oriented to person, place and time recent and remote memory intact normal attention and concentration language fluent, comprehension intact, naming intact fund of knowledge appropriate  CRANIAL NERVE: 2nd, 3rd, 4th, 6th - pupils equal and reactive to light, visual fields full to confrontation, extraocular muscles intact, no nystagmus 5th - facial sensation symmetric 7th - facial strength symmetric 8th - hearing intact 9th - palate elevates symmetrically, uvula midline 11th - shoulder shrug symmetric 12th - tongue protrusion midline  MOTOR:  normal bulk and tone, full strength in the BUE, BLE  SENSORY:  normal and symmetric to light touch, pinprick, temperature, vibration  COORDINATION:  finger-nose-finger, fine finger movements normal  REFLEXES:  deep tendon reflexes present and symmetric  GAIT/STATION:  normal   DIAGNOSTIC DATA (LABS, IMAGING, TESTING) - I reviewed patient records, labs, notes, testing and imaging myself where available.  Lab Results  Component Value Date   WBC 9.8 11/23/2020   HGB 12.5 11/23/2020   HCT 37.8 11/23/2020   MCV 85 11/23/2020   PLT 322 11/23/2020      Component Value Date/Time   NA 142 12/28/2020 1232   K 4.2 12/28/2020 1232   CL 104 12/28/2020 1232   CO2 24 12/28/2020 1232   GLUCOSE 81 12/28/2020 1232   GLUCOSE 94 08/17/2016 0823   BUN 14 12/28/2020 1232   CREATININE 0.88 12/28/2020 1232   CREATININE 0.82 08/17/2016 0823   CALCIUM 9.5 12/28/2020 1232   PROT 7.3 11/23/2020 1558   ALBUMIN 4.4 11/23/2020 1558   AST 17 11/23/2020 1558   ALT 15  11/23/2020 1558   ALKPHOS 60 11/23/2020 1558   BILITOT <0.2 11/23/2020 1558   GFRNONAA 92 02/03/2020 1022   GFRAA 107 02/03/2020 1022   Lab Results  Component Value Date   CHOL 148 02/03/2020   HDL 64 02/03/2020   LDLCALC 73 02/03/2020   TRIG 50 02/03/2020   CHOLHDL 2.3 02/03/2020   Lab Results  Component Value Date   HGBA1C 5.6 02/03/2020   Lab Results  Component Value Date   VITAMINB12 1,335 (H) 12/28/2020   Lab Results  Component Value Date   TSH 0.718 11/23/2020    MRI Brain 12/19/2020:  Unremarkable non-contrast MRI appearance of the brain. No evidence of acute intracranial abnormality.  MRA Neck 12/19/2020 Negative MRA of the neck with wide patency of both carotid artery systems and vertebral arteries. No hemodynamically significant stenosis or other significant vascular abnormality   ASSESSMENT AND PLAN  40 y.o. year old female with Past medical history of heart murmur, chronic sinusitis who is presenting with 1 episode of syncope.  Patient reported prodromal symptom of lightheadedness and dizziness prior to the syncopal episode and on that day she reports having 3 glasses of wine.  Patient syncope likely vasovagal.  She did have MRI of the brain which was negative for any acute intracranial pathology and MRA of the neck which was also normal.  For completion, I will order a routine EEG to rule out any abnormal brain activities.  I will contact the patient after completion of the EEG and if normal she can follow-up as needed.   1. Syncope, unspecified syncope type      PLAN: Routine EEG Return if worse     Orders Placed This Encounter  Procedures   EEG adult     No orders of the defined types were placed in this encounter.   Return if symptoms worsen or fail to improve.    Alric Ran, MD 01/18/2021,  9:11 AM  [facility] [address] [place], [place] [address] [phone]

## 2021-01-20 ENCOUNTER — Ambulatory Visit (INDEPENDENT_AMBULATORY_CARE_PROVIDER_SITE_OTHER): Payer: 59 | Admitting: Neurology

## 2021-01-20 ENCOUNTER — Other Ambulatory Visit: Payer: Self-pay

## 2021-01-20 DIAGNOSIS — R55 Syncope and collapse: Secondary | ICD-10-CM

## 2021-01-21 NOTE — Procedures (Signed)
    History:  43 yof with syncopal episode  EEG classification: Normal awake and drowsy  Description of the recording: The background rhythms of this recording consists of a fairly well modulated medium amplitude alpha rhythm of 9-10 Hz that is reactive to eye opening and closure. As the record progresses, the patient appears to remain in the waking state throughout the recording. Photic stimulation was performed, did not show any abnormalities. Hyperventilation was also performed, did not show any abnormalities. Toward the end of the recording, the patient enters the drowsy state with slight symmetric slowing seen. The patient never enters stage II sleep. No abnormal epileptiform discharges seen during this recording. There was no focal slowing. EKG monitor shows no evidence of cardiac rhythm abnormalities with a heart rate of 72 bpm.  Impression: This is a normal EEG recording in the waking and drowsy state. No evidence of interictal epileptiform discharges seen. A normal EEG does not exclude a diagnosis of epilepsy.    Alric Ran, MD [facility]

## 2021-01-22 ENCOUNTER — Encounter: Payer: Self-pay | Admitting: Family Medicine

## 2021-01-22 ENCOUNTER — Other Ambulatory Visit: Payer: Self-pay

## 2021-01-22 ENCOUNTER — Telehealth (INDEPENDENT_AMBULATORY_CARE_PROVIDER_SITE_OTHER): Payer: 59 | Admitting: Family Medicine

## 2021-01-22 ENCOUNTER — Other Ambulatory Visit (HOSPITAL_COMMUNITY): Payer: Self-pay

## 2021-01-22 VITALS — Temp 99.9°F | Wt 206.0 lb

## 2021-01-22 DIAGNOSIS — U071 COVID-19: Secondary | ICD-10-CM

## 2021-01-22 DIAGNOSIS — U099 Post covid-19 condition, unspecified: Secondary | ICD-10-CM

## 2021-01-22 MED ORDER — NIRMATRELVIR/RITONAVIR (PAXLOVID)TABLET: 3.0000 | ORAL_TABLET | Freq: Two times a day (BID) | ORAL | 0 refills | Status: DC

## 2021-01-22 MED ORDER — NIRMATRELVIR/RITONAVIR (PAXLOVID)TABLET
3.0000 | ORAL_TABLET | Freq: Two times a day (BID) | ORAL | 0 refills | Status: AC
Start: 2021-01-22 — End: 2021-01-27
  Filled 2021-01-22: qty 30, 5d supply, fill #0

## 2021-01-22 NOTE — Progress Notes (Signed)
   Subjective:    Patient ID: Jacqueline Winters, female    DOB: Aug 15, 1980, 40 y.o.   MRN: [id number]  HPI Documentation for virtual audio and video telecommunications through [place] encounter:  The patient was located at home. 2 patient identifiers used.  The provider was located in the office. The patient did consent to this visit and is aware of possible charges through their insurance for this visit. The other persons participating in this telemedicine service were none. Time spent on call was 5 minutes and in review of previous records >15 minutes total for counseling and coordination of care. This virtual service is not related to other E/M service within previous 7 days.  She states that on Wednesday she developed a slight headache and sore throat and progressed over the next day or so to fever, chills, myalgias, cough and fatigue.  She tested for COVID today and was positive.  She did test earlier in the week and was negative.  She has had 3 COVID vaccines.  She also had COVID in January of this year and did have long-haul her symptoms of tachycardia, dizziness and headache.  She has been seen by cardiology as well as neurology.  She had an FMLA and it was good until October 15.  Review of Systems     Objective:   Physical Exam Alert and in no distress otherwise not examined       Assessment & Plan:  COVID-19 virus infection - Plan: nirmatrelvir/ritonavir EUA (PAXLOVID) 20 x 150 MG & 10 x 100MG  TABS  COVID-19 long hauler I think it is reasonable to place her on Paxil that as well as treat her symptomatically.  Since she is a long-haul her I think treating her more aggressively is appropriate to hopefully reduce the likelihood of further long-haul or type symptoms.  Discussed worsening of cough, congestion and shortness of breath.  She is to stay sequestered for 5 days and as long as she is getting better she may return to work.  Note written for her to return to work on Tuesday  but did recommend she call health at work to verify what their procedures are.

## 2021-01-28 ENCOUNTER — Encounter: Payer: Self-pay | Admitting: Family Medicine

## 2021-01-28 ENCOUNTER — Telehealth: Payer: Self-pay | Admitting: Medical

## 2021-01-28 NOTE — Telephone Encounter (Signed)
I received another disability form from [place].  I have already completed 1 of these already  At this point she is seeing a specialist.  I have not taken her out of work or continue to take her out of work, so I am not sure with this form is for.  Was this suppose to go to neurology?

## 2021-01-28 NOTE — Telephone Encounter (Signed)
Pt is requesting disability forms since she is positive covid and has not returned to work. These forms are not related to passing out so shane will not fill out forms and since you saw her for covid recently- I will place forms in your folder for you to fill out

## 2021-02-02 ENCOUNTER — Ambulatory Visit: Payer: 59 | Admitting: Family Medicine

## 2021-02-02 ENCOUNTER — Other Ambulatory Visit: Payer: Self-pay

## 2021-02-02 ENCOUNTER — Encounter: Payer: Self-pay | Admitting: Family Medicine

## 2021-02-02 VITALS — BP 100/62 | HR 85 | Temp 98.6°F | Wt 205.6 lb

## 2021-02-02 DIAGNOSIS — U071 COVID-19: Secondary | ICD-10-CM | POA: Diagnosis not present

## 2021-02-02 NOTE — Progress Notes (Signed)
   Subjective:    Patient ID: Jacqueline Winters, female    DOB: January 02, 1981, 40 y.o.   MRN: [id number]  HPI She is here for a recheck.  She has had continued difficulty with COVID related symptoms.  She was seen virtually on October 14 for reoccurrence of COVID.  She did have an FMLA filled out which would have covered her third October 15.  Today is a follow-up.  She states that she is 90% better having only slight fatigue and occasional headache that does respond to ibuprofen.  No fever, chills, chest pain, cough or sore throat.  She does state that she has had some difficulty with her menses being longer than normal but has been slowly diminishing in nature.   Review of Systems     Objective:   Physical Exam Alert and in no distress. Tympanic membranes and canals are normal. Pharyngeal area is normal. Neck is supple without adenopathy or thyromegaly. Cardiac exam shows a regular sinus rhythm without murmurs or gallops. Lungs are clear to auscultation.        Assessment & Plan:  COVID-19 virus infection Since she is 90% better I will fill out the paperwork to allow her to return to work on Thursday.  Reassured her that I thought the symptoms of menstrual bleeding should slowly go away.

## 2021-02-03 ENCOUNTER — Ambulatory Visit (INDEPENDENT_AMBULATORY_CARE_PROVIDER_SITE_OTHER): Payer: 59 | Admitting: Medical

## 2021-02-03 ENCOUNTER — Other Ambulatory Visit: Payer: Self-pay

## 2021-02-03 ENCOUNTER — Other Ambulatory Visit (HOSPITAL_COMMUNITY): Payer: Self-pay

## 2021-02-03 ENCOUNTER — Encounter: Payer: Self-pay | Admitting: Medical

## 2021-02-03 VITALS — BP 124/82 | HR 70 | Ht 64.25 in | Wt 206.2 lb

## 2021-02-03 DIAGNOSIS — Z13220 Encounter for screening for lipoid disorders: Secondary | ICD-10-CM | POA: Diagnosis not present

## 2021-02-03 DIAGNOSIS — L732 Hidradenitis suppurativa: Secondary | ICD-10-CM

## 2021-02-03 DIAGNOSIS — R748 Abnormal levels of other serum enzymes: Secondary | ICD-10-CM | POA: Insufficient documentation

## 2021-02-03 DIAGNOSIS — R7989 Other specified abnormal findings of blood chemistry: Secondary | ICD-10-CM

## 2021-02-03 DIAGNOSIS — U071 COVID-19: Secondary | ICD-10-CM | POA: Diagnosis not present

## 2021-02-03 DIAGNOSIS — Z0000 Encounter for general adult medical examination without abnormal findings: Secondary | ICD-10-CM

## 2021-02-03 DIAGNOSIS — Z131 Encounter for screening for diabetes mellitus: Secondary | ICD-10-CM

## 2021-02-03 DIAGNOSIS — E041 Nontoxic single thyroid nodule: Secondary | ICD-10-CM | POA: Diagnosis not present

## 2021-02-03 DIAGNOSIS — Z87891 Personal history of nicotine dependence: Secondary | ICD-10-CM | POA: Diagnosis not present

## 2021-02-03 DIAGNOSIS — Z7185 Encounter for immunization safety counseling: Secondary | ICD-10-CM

## 2021-02-03 MED ORDER — CEPHALEXIN 500 MG PO CAPS: 500.0000 mg | ORAL_CAPSULE | Freq: Three times a day (TID) | ORAL | 2 refills | Status: DC

## 2021-02-03 MED ORDER — CEPHALEXIN 500 MG PO CAPS
500.0000 mg | ORAL_CAPSULE | Freq: Three times a day (TID) | ORAL | 2 refills | Status: DC
  Filled 2021-02-03: qty 30, 10d supply, fill #0

## 2021-02-03 NOTE — Progress Notes (Signed)
Subjective:   HPI  Jacqueline Winters is a 40 y.o. female who presents for Chief Complaint  Patient presents with   fasting cpe    Fasting cpe, would like to get pap done here if possible. Seen obgyn in past for abnormal pap.      Patient Care Team: Katrina Brosh, Leward Quan as PCP - General (Family Medicine) Sees dentist: Dr. Marian Sorrow (Family dentistry) Sees eye doctor:Lens Cra[facility] Dr. Alric Ran, neurology Dr. Kate Sable, cardiology   Concerns: Quit smoking 11/23/2020!  Had covid recently, just now getting back to normal after that illness.  Saw Dr. Redmond School here for covid recently.    In recent weeks saw neurology and cardiology after syncope episode.  Wants to recheck on thyroid nodule  She is followed by dermatology.  Saw plastic surgery about scar tissue in right axilla after recurrent hidradenitis.  Is planning to move forward but she would be unable to lift arm 4 weeks and can't take that time off work currently missing work recently for syncope, illness and then subsequent covid infection   Reviewed their medical, surgical, family, social, medication, and allergy history and updated chart as appropriate.  Past Medical History:  Diagnosis Date   Allergy    GERD (gastroesophageal reflux disease)    with pregnancy   Goiter    Headache(784.0)    Heart murmur    Hidradenitis axillaris    Impaired fasting blood sugar    Insomnia    Obesity    PONV (postoperative nausea and vomiting)    Thyroid nodule    biopsy 07/2013, consult with Dr. Iran Planas endocrinology   Tobacco use disorder     Family History  Problem Relation Age of Onset   Hypertension Mother    Diabetes Mother    Hypertension Sister    Cancer Maternal Grandmother        colon, ovarian   Hypertension Sister    Arthritis Father    Cancer Maternal Aunt        breast   Heart disease Neg Hx    Stroke Neg Hx      Current Outpatient Medications:    acetaminophen  (TYLENOL) 500 MG tablet, Take 500 mg by mouth every 6 (six) hours as needed., Disp: , Rfl:    cholecalciferol (VITAMIN D3) 25 MCG (1000 UNIT) tablet, Take by mouth daily., Disp: , Rfl:    ibuprofen (ADVIL) 600 MG tablet, Take 1 tablet (600 mg total) by mouth every 6 (six) hours as needed., Disp: 30 tablet, Rfl: 0   Iron-Vit C-Vit B12-Folic Acid (IRON 876 PLUS PO), , Disp: , Rfl:    Magnesium 200 MG TABS, Take 250 mg by mouth daily., Disp: , Rfl:    Multiple Vitamin (MULTIVITAMIN) tablet, Take 1 tablet by mouth daily., Disp: , Rfl:    Multiple Vitamins-Minerals (ZINC PO), Take by mouth daily in the afternoon., Disp: , Rfl:    Omega-3 Fatty Acids (FISH OIL OMEGA-3 PO), Take 1 capsule by mouth daily., Disp: , Rfl:    VITAMIN E PO, Take 1 capsule by mouth daily., Disp: , Rfl:   Allergies  Allergen Reactions   Hydrocodone-Acetaminophen Nausea Only   Vicodin [Hydrocodone-Acetaminophen]     uneasy     Review of Systems Constitutional: -fever, -chills, -sweats, -unexpected weight change, -decreased appetite, -fatigue Allergy: -sneezing, -itching, -congestion Dermatology: -changing moles, -rash, +lumps ENT: -runny nose, -ear pain, -sore throat, -hoarseness, -sinus pain, -teeth pain, - ringing in ears, -hearing loss, -  nosebleeds Cardiology: -chest pain, -palpitations, -swelling, -difficulty breathing when lying flat, -waking up short of breath Respiratory: -cough, -shortness of breath, -difficulty breathing with exercise or exertion, -wheezing, -coughing up blood Gastroenterology: -abdominal pain, -nausea, -vomiting, -diarrhea, -constipation, -blood in stool, -changes in bowel movement, -difficulty swallowing or eating Hematology: -bleeding, -bruising  Musculoskeletal: -joint aches, -muscle aches, -joint swelling, -back pain, -neck pain, -cramping, -changes in gait Ophthalmology: denies vision changes, eye redness, itching, discharge Urology: -burning with urination, -difficulty urinating, -blood  in urine, -urinary frequency, -urgency, -incontinence Neurology: -headache, -weakness, -tingling, -numbness, -memory loss, -falls, -dizziness Psychology: -depressed mood, -agitation, -sleep problems Breast/gyn: -breast tendnerss, -discharge, -lumps, -vaginal discharge,- irregular periods, -heavy periods     Objective:  BP 124/82   Pulse 70   Ht 5' 4.25" (1.632 m)   Wt 206 lb 3.2 oz (93.5 kg)   BMI 35.12 kg/m   Wt Readings from Last 3 Encounters:  02/03/21 206 lb 3.2 oz (93.5 kg)  02/02/21 205 lb 9.6 oz (93.3 kg)  01/22/21 206 lb (93.4 kg)    General appearance: alert, no distress, WD/WN, African American female Skin: Scarring right axilla from prior hidradenitis issues, otherwise no worrisome lesions HEENT: normocephalic, conjunctiva/corneas normal, sclerae anicteric, PERRLA, EOMi, nares patent, no discharge or erythema, pharynx normal Oral cavity: Not examined due to Covid precautions Neck: supple, no lymphadenopathy, generalized goiter without nodules, no masses, normal ROM, no bruits Chest: non tender, normal shape and expansion Heart: RRR, normal S1, S2, no murmurs Lungs: CTA bilaterally, no wheezes, rhonchi, or rales Abdomen: +bs, soft, non tender, non distended, no masses, no hepatomegaly, no splenomegaly, no bruits Back: non tender, normal ROM, no scoliosis Musculoskeletal: upper extremities non tender, no obvious deformity, normal ROM throughout, lower extremities non tender, no obvious deformity, normal ROM throughout Extremities: no edema, no cyanosis, no clubbing Pulses: 2+ symmetric, upper and lower extremities, normal cap refill Neurological: alert, oriented x 3, CN2-12 intact, strength normal upper extremities and lower extremities, sensation normal throughout, DTRs 2+ throughout, no cerebellar signs, gait normal Psychiatric: normal affect, behavior normal, pleasant  Breast/gyn/rectal - deferred to gynecology     Assessment and Plan :   Encounter Diagnoses   Name Primary?   Encounter for health maintenance examination in adult Yes   Screening for diabetes mellitus    Elevated vitamin B12 level    Vaccine counseling    COVID-19 virus infection    Thyroid nodule    Screening for lipid disorders    Former smoker      Physical exam - discussed and counseled on healthy lifestyle, diet, exercise, preventative care, vaccinations, sick and well care, proper use of emergency dept and after hours care, and addressed their concerns.    Health screening: Advised they see their eye doctor yearly for routine vision care. Advised they see their dentist yearly for routine dental care including hygiene visits twice yearly. See your gynecologist yearly for routine gynecological care.   Cancer screening Counseled on self breast exams, mammograms, cervical cancer screening.  Advised mammogram age 74  She has plans to follow-up with gynecology for breast exam and surveillance,f/u with pap with gyn soon  Colon cancer screen age 75  Vaccinations: Immunization History  Administered Date(s) Administered   DTaP 06/29/1981, 12/03/1981, 09/03/1982, 09/26/1983, 12/04/1986   Hepatitis B 01/12/1994, 02/16/1994, 07/27/1994   Hepatitis B, ped/adol 01/12/1994, 02/16/1994, 07/27/1994   IPV 06/29/1981, 12/03/1981, 09/03/1982, 09/26/1983, 12/03/1986   Influenza Split 12/23/2011   Influenza,inj,Quad PF,6+ Mos 02/03/2020   Influenza-Unspecified 01/09/2014, 01/12/2015  MMR 09/03/1982   PFIZER Comirnaty(Gray Top)Covid-19 Tri-Sucrose Vaccine 05/02/2019, 05/24/2019, 04/07/2020   PFIZER(Purple Top)SARS-COV-2 Vaccination 05/02/2019, 05/24/2019, 04/07/2020   PPD Test 07/07/2015   Pneumococcal Polysaccharide-23 12/23/2011   Tdap 12/22/2011   She notes having flu shot yesterday with health at work  Advised pneumococcal vaccine soon (Pneumococcal 23)  She just got over covid within last few weeks, so developing antibiotics for this     Separate significant issues  discussed: Hidradenitis, scarring from prior hidradenitis-she plans to potentially do plastic surgery to help with scar tissue.  She can use Keflex for flareups since doxycycline has not helped  Thyroid nodule-update ultrasound today at her request  Recent syncope, recent elevated B12 and screening for diabetes-additional labs today  Vitamin D deficiency-continue supplement  Former smoker, quit in August    Abbee was seen today for fasting cpe.  Diagnoses and all orders for this visit:  Encounter for health maintenance examination in adult -     Vitamin B12 -     Lipid panel -     VITAMIN D 25 Hydroxy (Vit-D Deficiency, Fractures) -     Cortisol -     Insulin and C-Peptide -     Hemoglobin A1c -     US THYROID; Future  Screening for diabetes mellitus -     Cortisol -     Insulin and C-Peptide -     Hemoglobin A1c  Elevated vitamin B12 level -     Vitamin B12 -     Cortisol -     Insulin and C-Peptide -     Hemoglobin A1c  Vaccine counseling  COVID-19 virus infection  Thyroid nodule -     US THYROID; Future  Screening for lipid disorders -     Lipid panel  Former smoker   Follow-up pending labs, yearly for physical

## 2021-02-04 ENCOUNTER — Ambulatory Visit
Admission: RE | Admit: 2021-02-04 | Discharge: 2021-02-04 | Disposition: A | Discharge status: Home / Self Care | Payer: 59 | Source: Ambulatory Visit | Attending: Medical | Admitting: Medical

## 2021-02-04 ENCOUNTER — Other Ambulatory Visit: Payer: Self-pay

## 2021-02-04 ENCOUNTER — Institutional Professional Consult (permissible substitution): Payer: 59 | Admitting: Family Medicine

## 2021-02-04 DIAGNOSIS — E042 Nontoxic multinodular goiter: Secondary | ICD-10-CM | POA: Diagnosis not present

## 2021-02-04 DIAGNOSIS — Z0000 Encounter for general adult medical examination without abnormal findings: Secondary | ICD-10-CM

## 2021-02-04 DIAGNOSIS — Z131 Encounter for screening for diabetes mellitus: Secondary | ICD-10-CM

## 2021-02-04 DIAGNOSIS — E041 Nontoxic single thyroid nodule: Secondary | ICD-10-CM

## 2021-02-04 LAB — LIPID PANEL
Chol/HDL Ratio: 2.6 ratio (ref 0.0–4.4)
Cholesterol, Total: 177 mg/dL (ref 100–199)
HDL: 67 mg/dL (ref >39)
LDL Chol Calc (NIH): 97 mg/dL (ref 0–99)
Triglycerides: 69 mg/dL (ref 0–149)
VLDL Cholesterol Cal: 13 mg/dL (ref 5–40)

## 2021-02-04 LAB — CORTISOL: Cortisol: 6 ug/dL

## 2021-02-04 LAB — HEMOGLOBIN A1C
Est. average glucose Bld gHb Est-mCnc: 111 mg/dL
Hgb A1c MFr Bld: 5.5 % (ref 4.8–5.6)

## 2021-02-04 LAB — INSULIN AND C-PEPTIDE, SERUM
C-Peptide: 2 ng/mL (ref 1.1–4.4)
INSULIN: 9.7 u[IU]/mL (ref 2.6–24.9)

## 2021-02-04 LAB — VITAMIN B12: Vitamin B-12: 1321 pg/mL — ABNORMAL HIGH (ref 232–1245)

## 2021-02-04 LAB — VITAMIN D 25 HYDROXY (VIT D DEFICIENCY, FRACTURES): Vit D, 25-Hydroxy: 33.3 ng/mL (ref 30.0–100.0)

## 2021-02-09 ENCOUNTER — Other Ambulatory Visit (HOSPITAL_COMMUNITY): Payer: Self-pay

## 2021-02-09 DIAGNOSIS — Z124 Encounter for screening for malignant neoplasm of cervix: Secondary | ICD-10-CM | POA: Diagnosis not present

## 2021-02-09 DIAGNOSIS — R87810 Cervical high risk human papillomavirus (HPV) DNA test positive: Secondary | ICD-10-CM | POA: Diagnosis not present

## 2021-02-09 DIAGNOSIS — Z01419 Encounter for gynecological examination (general) (routine) without abnormal findings: Secondary | ICD-10-CM | POA: Diagnosis not present

## 2021-02-09 DIAGNOSIS — R300 Dysuria: Secondary | ICD-10-CM | POA: Diagnosis not present

## 2021-02-09 MED ORDER — MEDROXYPROGESTERONE ACETATE 10 MG PO TABS
10.0000 mg | ORAL_TABLET | Freq: Every day | ORAL | 0 refills | Status: DC
  Filled 2021-02-09: qty 10, 10d supply, fill #0

## 2021-02-16 DIAGNOSIS — N920 Excessive and frequent menstruation with regular cycle: Secondary | ICD-10-CM | POA: Diagnosis not present

## 2021-02-16 DIAGNOSIS — Z6835 Body mass index (BMI) 35.0-35.9, adult: Secondary | ICD-10-CM | POA: Diagnosis not present

## 2021-02-16 DIAGNOSIS — E042 Nontoxic multinodular goiter: Secondary | ICD-10-CM | POA: Diagnosis not present

## 2021-02-16 DIAGNOSIS — I5189 Other ill-defined heart diseases: Secondary | ICD-10-CM | POA: Diagnosis not present

## 2021-02-22 NOTE — Telephone Encounter (Signed)
Patient called and wanted you to look at her lab results that she attached from Dr. Garnet Koyanagi. Please reply to patient

## 2021-03-18 ENCOUNTER — Other Ambulatory Visit (HOSPITAL_COMMUNITY): Payer: Self-pay | Admitting: *Deleted

## 2021-03-19 ENCOUNTER — Ambulatory Visit (HOSPITAL_COMMUNITY)
Admission: RE | Admit: 2021-03-19 | Discharge: 2021-03-19 | Disposition: A | Discharge status: Home / Self Care | Payer: 59 | Source: Ambulatory Visit | Attending: Internal Medicine | Admitting: Internal Medicine

## 2021-03-19 ENCOUNTER — Other Ambulatory Visit: Payer: Self-pay

## 2021-03-19 DIAGNOSIS — E2740 Unspecified adrenocortical insufficiency: Secondary | ICD-10-CM | POA: Insufficient documentation

## 2021-03-19 LAB — ACTH STIMULATION, 3 TIME POINTS
Cortisol, 30 Min: 26.2 ug/dL
Cortisol, 60 Min: 22.3 ug/dL
Cortisol, Base: 6.2 ug/dL

## 2021-03-19 MED ORDER — COSYNTROPIN 0.25 MG IJ SOLR
0.2500 mg | Freq: Once | INTRAMUSCULAR | Status: AC
  Administered 2021-03-19: 0.25 mg via INTRAMUSCULAR

## 2021-03-19 MED ORDER — COSYNTROPIN 0.25 MG IJ SOLR
INTRAMUSCULAR | Status: AC
  Filled 2021-03-19: qty 0.25

## 2021-03-19 NOTE — Progress Notes (Addendum)
Patient stated she has been NPO. 0845 Base line labs drawn (1 purple on ice, 1 gel ) and sent to lab.  8003- cosyntropin IM given  0920- 30 min cortisol level drawn and sent to lab  0950- 60 min cortisol level drawn and sent to lab Vital signs stable 122/77, 50, 100 o2 sat

## 2021-03-20 LAB — ACTH: C206 ACTH: <1.5 pg/mL — ABNORMAL LOW (ref 7.2–63.3)

## 2021-03-31 DIAGNOSIS — M5032 Other cervical disc degeneration, mid-cervical region, unspecified level: Secondary | ICD-10-CM | POA: Diagnosis not present

## 2021-03-31 DIAGNOSIS — M9903 Segmental and somatic dysfunction of lumbar region: Secondary | ICD-10-CM | POA: Diagnosis not present

## 2021-03-31 DIAGNOSIS — M542 Cervicalgia: Secondary | ICD-10-CM | POA: Diagnosis not present

## 2021-03-31 DIAGNOSIS — M9901 Segmental and somatic dysfunction of cervical region: Secondary | ICD-10-CM | POA: Diagnosis not present

## 2021-04-01 DIAGNOSIS — M542 Cervicalgia: Secondary | ICD-10-CM | POA: Diagnosis not present

## 2021-04-01 DIAGNOSIS — M5032 Other cervical disc degeneration, mid-cervical region, unspecified level: Secondary | ICD-10-CM | POA: Diagnosis not present

## 2021-04-01 DIAGNOSIS — M9903 Segmental and somatic dysfunction of lumbar region: Secondary | ICD-10-CM | POA: Diagnosis not present

## 2021-04-01 DIAGNOSIS — M9901 Segmental and somatic dysfunction of cervical region: Secondary | ICD-10-CM | POA: Diagnosis not present

## 2021-04-06 DIAGNOSIS — M9901 Segmental and somatic dysfunction of cervical region: Secondary | ICD-10-CM | POA: Diagnosis not present

## 2021-04-06 DIAGNOSIS — M542 Cervicalgia: Secondary | ICD-10-CM | POA: Diagnosis not present

## 2021-04-06 DIAGNOSIS — M5032 Other cervical disc degeneration, mid-cervical region, unspecified level: Secondary | ICD-10-CM | POA: Diagnosis not present

## 2021-04-06 DIAGNOSIS — M9903 Segmental and somatic dysfunction of lumbar region: Secondary | ICD-10-CM | POA: Diagnosis not present

## 2021-04-08 DIAGNOSIS — M9901 Segmental and somatic dysfunction of cervical region: Secondary | ICD-10-CM | POA: Diagnosis not present

## 2021-04-08 DIAGNOSIS — M5032 Other cervical disc degeneration, mid-cervical region, unspecified level: Secondary | ICD-10-CM | POA: Diagnosis not present

## 2021-04-08 DIAGNOSIS — M542 Cervicalgia: Secondary | ICD-10-CM | POA: Diagnosis not present

## 2021-04-08 DIAGNOSIS — M9903 Segmental and somatic dysfunction of lumbar region: Secondary | ICD-10-CM | POA: Diagnosis not present

## 2021-04-21 DIAGNOSIS — Z6835 Body mass index (BMI) 35.0-35.9, adult: Secondary | ICD-10-CM | POA: Diagnosis not present

## 2021-04-21 DIAGNOSIS — E042 Nontoxic multinodular goiter: Secondary | ICD-10-CM | POA: Diagnosis not present

## 2021-05-03 ENCOUNTER — Other Ambulatory Visit (HOSPITAL_COMMUNITY): Payer: Self-pay

## 2021-05-03 ENCOUNTER — Other Ambulatory Visit: Payer: Self-pay

## 2021-05-03 ENCOUNTER — Telehealth (INDEPENDENT_AMBULATORY_CARE_PROVIDER_SITE_OTHER): Payer: 59 | Admitting: Family Medicine

## 2021-05-03 ENCOUNTER — Encounter: Payer: Self-pay | Admitting: Family Medicine

## 2021-05-03 VITALS — Temp 98.3°F | Wt 206.0 lb

## 2021-05-03 DIAGNOSIS — M79651 Pain in right thigh: Secondary | ICD-10-CM

## 2021-05-03 MED ORDER — CARISOPRODOL 350 MG PO TABS
350.0000 mg | ORAL_TABLET | Freq: Every day | ORAL | 0 refills | Status: DC
  Filled 2021-05-03: qty 12, 12d supply, fill #0

## 2021-05-03 NOTE — Progress Notes (Signed)
° °  Subjective:    Patient ID: Jacqueline Winters, female    DOB: Jul 22, 1980, 41 y.o.   MRN: [id number]  HPI Documentation for virtual audio and video telecommunications through [place] encounter: The patient was located at home. 2 patient identifiers used.  The provider was located in the office. The patient did consent to this visit and is aware of possible charges through their insurance for this visit. The other persons participating in this telemedicine service were none. Time spent on call was 5 minutes and in review of previous records >20 minutes total for counseling and coordination of care. This virtual service is not related to other E/M service within previous 7 days.  She states that Friday she noted right thigh pain and what she describes as spasm in the thigh area.  She did take some Aleve as many as 4 at 1 time without much relief.  She then saw the chiropractor is apparently doing some manipulations and mention the possibility of a muscle relaxer for this.  Review of Systems     Objective:   Physical Exam She points to the anterior and lateral thigh area as to where she is having trouble and does note pain on motion of the hip especially with flexion and abduction.       Assessment & Plan:  Pain of right thigh - Plan: carisoprodol (SOMA) 350 MG tablet Recommended 2 Aleve twice per day and can also use 2 Tylenol 4 times per day.  Cautioned against using the Soma during the day and therefore mainly just at night.  She will call if she has further difficulty.

## 2021-05-06 ENCOUNTER — Encounter: Payer: Self-pay | Admitting: Internal Medicine

## 2021-06-22 ENCOUNTER — Other Ambulatory Visit: Payer: Self-pay

## 2021-06-22 ENCOUNTER — Other Ambulatory Visit (HOSPITAL_COMMUNITY): Payer: Self-pay

## 2021-06-22 ENCOUNTER — Ambulatory Visit: Payer: 59 | Admitting: Medical

## 2021-06-22 VITALS — BP 120/82 | HR 76 | Temp 98.3°F | Wt 210.0 lb

## 2021-06-22 DIAGNOSIS — L732 Hidradenitis suppurativa: Secondary | ICD-10-CM | POA: Diagnosis not present

## 2021-06-22 DIAGNOSIS — Z87891 Personal history of nicotine dependence: Secondary | ICD-10-CM

## 2021-06-22 MED ORDER — DOXYCYCLINE HYCLATE 100 MG PO TABS
100.0000 mg | ORAL_TABLET | Freq: Two times a day (BID) | ORAL | 0 refills | Status: DC
  Filled 2021-06-22: qty 20, 10d supply, fill #0

## 2021-06-22 NOTE — Progress Notes (Signed)
Subjective: ?Chief Complaint  ?Patient presents with  ? boil reoccuring  ?  Boil reoccuring. Bursted yesterday while at work  ? 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  She has dealt with this for several years now just on the right side.  Over the last 2 years she has taken antibiotics such as doxycycline on a fairly regular occasion.  She gets flareups fairly regularly.  The current flareup started in the last week and spontaneously ruptured and drained a few days ago.  It is still tender and swollen.  I referred her to dermatology in the past year but they felt like it was time to see plastic surgery. No other aggravating or relieving factors. No other complaint. ? ?Past Medical History:  ?Diagnosis Date  ? Allergy   ? GERD (gastroesophageal reflux disease)   ? with pregnancy  ? Goiter   ? Headache(784.0)   ? Heart murmur   ? Hidradenitis axillaris   ? Impaired fasting blood sugar   ? Insomnia   ? Obesity   ? PONV (postoperative nausea and vomiting)   ? Thyroid nodule   ? biopsy 07/2013, consult with Dr. Iran Planas endocrinology  ? Tobacco use disorder   ? ?Current Outpatient Medications on File Prior to Visit  ?Medication Sig Dispense Refill  ? acetaminophen (TYLENOL) 500 MG tablet Take 500 mg by mouth every 6 (six) hours as needed.    ? cholecalciferol (VITAMIN D3) 25 MCG (1000 UNIT) tablet Take by mouth daily.    ? ibuprofen (ADVIL) 600 MG tablet Take 1 tablet (600 mg total) by mouth every 6 (six) hours as needed. 30 tablet 0  ? Iron-Vit C-Vit B12-Folic Acid (IRON 403 PLUS PO)     ? Magnesium 200 MG TABS Take 250 mg by mouth daily.    ? Multiple Vitamin (MULTIVITAMIN) tablet Take 1 tablet by mouth daily.    ? Multiple Vitamins-Minerals (ZINC PO) Take by mouth daily in the afternoon.    ? Omega-3 Fatty Acids (FISH OIL OMEGA-3 PO) Take 1 capsule by mouth daily.    ? VITAMIN E PO Take 1 capsule by mouth daily.    ? ?No current facility-administered medications on file prior to visit.   ? ?ROS as in subjective ? ? ?Objective: ?BP 120/82   Pulse 76   Temp 98.3 ?F (36.8 ?C)   Wt 210 lb (95.3 kg)   BMI 35.77 kg/m?  ? ?General well-developed well-nourished no acute distress ?Right axilla with large swollen indurated subcutaneous mass.  She seems to be in pain pressing on the lesion, there is an opening to the wound that drained recently so there is some redness at the opening but no fluctuance.  No warmth ? ? ? ?Assessment: ?Encounter Diagnoses  ?Name Primary?  ? Hidradenitis axillaris Yes  ? Former smoker   ? ? ? ?Plan: ?Begin medication as below, refer to plastic surgery for further options for more definitive treatment given the long-term nature of recurrence ? ? ?Jacqueline Winters was seen today for boil reoccuring. ? ?Diagnoses and all orders for this visit: ? ?Hidradenitis axillaris ?-     Ambulatory referral to Plastic Surgery ? ?Former smoker ?-     Ambulatory referral to Plastic Surgery ? ?Other orders ?-     doxycycline (VIBRA-TABS) 100 MG tablet; Take 1 tablet (100 mg total) by mouth 2 (two) times daily. ? ? ?F/u with plastic surgery soon ? ?

## 2021-06-23 ENCOUNTER — Encounter: Payer: Self-pay | Admitting: Medical

## 2021-07-15 ENCOUNTER — Encounter: Payer: Self-pay | Admitting: Plastic Surgery

## 2021-07-15 ENCOUNTER — Ambulatory Visit (INDEPENDENT_AMBULATORY_CARE_PROVIDER_SITE_OTHER): Payer: 59 | Admitting: Plastic Surgery

## 2021-07-15 VITALS — BP 116/77 | HR 66 | Ht 64.5 in | Wt 210.0 lb

## 2021-07-15 DIAGNOSIS — L732 Hidradenitis suppurativa: Secondary | ICD-10-CM

## 2021-07-15 NOTE — Progress Notes (Signed)
? ?Referring Provider ?Tysinger, Camelia Eng, PA-C ?[address] ?[place],  [place] [address]  ? ?CC:  ?Chief Complaint  ?Patient presents with  ? Consult  ?   ? ?Jacqueline Winters is an 41 y.o. female.  ?HPI: Patient presents to discuss right axillary hidradenitis.  Is been a problem for 20 years.  She is try conservative measures such as antibiotics and steroid injections.  She has been unsuccessful in eradicating the disease in 1 specific spot in her right axilla.  Otherwise she feels adequately treated in other areas. ? ?Allergies  ?Allergen Reactions  ? Hydrocodone-Acetaminophen Nausea Only  ? Vicodin [Hydrocodone-Acetaminophen]   ?  uneasy  ? ? ?Outpatient Encounter Medications as of 07/15/2021  ?Medication Sig  ? acetaminophen (TYLENOL) 500 MG tablet Take 500 mg by mouth every 6 (six) hours as needed.  ? cholecalciferol (VITAMIN D3) 25 MCG (1000 UNIT) tablet Take by mouth daily.  ? ibuprofen (ADVIL) 600 MG tablet Take 1 tablet (600 mg total) by mouth every 6 (six) hours as needed.  ? Iron-Vit C-Vit B12-Folic Acid (IRON 956 PLUS PO)   ? Magnesium 200 MG TABS Take 250 mg by mouth daily.  ? Multiple Vitamin (MULTIVITAMIN) tablet Take 1 tablet by mouth daily.  ? Multiple Vitamins-Minerals (ZINC PO) Take by mouth daily in the afternoon.  ? Omega-3 Fatty Acids (FISH OIL OMEGA-3 PO) Take 1 capsule by mouth daily.  ? VITAMIN E PO Take 1 capsule by mouth daily.  ? [DISCONTINUED] doxycycline (VIBRA-TABS) 100 MG tablet Take 1 tablet (100 mg total) by mouth 2 (two) times daily.  ? ?No facility-administered encounter medications on file as of 07/15/2021.  ?  ? ?Past Medical History:  ?Diagnosis Date  ? Allergy   ? GERD (gastroesophageal reflux disease)   ? with pregnancy  ? Goiter   ? Headache(784.0)   ? Heart murmur   ? Hidradenitis axillaris   ? Impaired fasting blood sugar   ? Insomnia   ? Obesity   ? PONV (postoperative nausea and vomiting)   ? Thyroid nodule   ? biopsy 07/2013, consult with Dr. Iran Planas endocrinology  ?  Tobacco use disorder   ? ? ?Past Surgical History:  ?Procedure Laterality Date  ? CESAREAN SECTION  12/21/2011  ? Procedure: CESAREAN SECTION;  Surgeon: Farrel Gobble. Harrington Challenger, MD;  Location: [facility];  Service: Obstetrics;  Laterality: N/A;  Bilateral Tubal Ligation  ? CHOLECYSTECTOMY  2009  ? INCISION AND DRAINAGE    ? right axillary boils  ? PILONIDAL CYST / SINUS EXCISION  2008  ? [facility]  ? TUBAL LIGATION    ? VAGINAL DELIVERY  2002, 1998  ? ? ?Family History  ?Problem Relation Age of Onset  ? Hypertension Mother   ? Diabetes Mother   ? Hypertension Sister   ? Cancer Maternal Grandmother   ?     colon, ovarian  ? Hypertension Sister   ? Arthritis Father   ? Cancer Maternal Aunt   ?     breast  ? Heart disease Neg Hx   ? Stroke Neg Hx   ? ? ?Social History  ? ?Social History Narrative  ? Married, 3 children.  Christian, exercises - zumba, fit camp.  Prior travel nursing.  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    ? 01/2021  ?  ? ?Review of Systems ?General: Denies fevers, chills, weight loss ?CV: Denies chest pain, shortness of breath, palpitations ? ?  Physical Exam ? ?  07/15/2021  ? 10:23 AM 06/22/2021  ? 12:30 PM 05/03/2021  ?  2:42 PM  ?Vitals with BMI  ?Height 5' 4.5"    ?Weight 210 lbs 210 lbs 206 lbs  ?BMI 35.5    ?Systolic 751 700   ?Diastolic 77 82   ?Pulse 66 76   ?  ?General:  No acute distress,  Alert and oriented, Non-Toxic, Normal speech and affect ?Examination of the right axilla shows a linear segment of hidradenitis tissue.  No active drainage or infection at this time.  The area is about 6 x 3 cm in size. ? ?Assessment/Plan ?Patient presents with a focal area of right axillary hidradenitis that does not appear to be easily managed conservatively.  She is tried prior medications and topical treatments that have not been successful.  I think is reasonable to excise this and closed primarily.  We discussed risks include bleeding, infection, damage  to surrounding structures and need for additional procedures.  I discussed the potential for this to recur and adjacent to that site or another areas of the body and she is well aware that.  We will plan to move forward with scheduling this in the operating room.  All of her questions were answered. ? ?Cindra Presume ?07/15/2021, 10:36 AM  ? ? ?  ?

## 2021-07-20 ENCOUNTER — Telehealth: Payer: Self-pay | Admitting: Plastic Surgery

## 2021-07-20 NOTE — Telephone Encounter (Signed)
Called to schedule surgery. Provided date of 5/1, but patient requested a date in June. Advised her we could do 6/6 at the time of our call. Patient would like to speak with her husband and will call back to advise which date would work best. Patient is understanding that she would have to meet deductible prior to insurance paying 80% of charges.  ?

## 2021-08-02 ENCOUNTER — Other Ambulatory Visit (HOSPITAL_COMMUNITY): Payer: Self-pay

## 2021-08-02 ENCOUNTER — Encounter: Payer: Self-pay | Admitting: Family Medicine

## 2021-08-02 ENCOUNTER — Ambulatory Visit (INDEPENDENT_AMBULATORY_CARE_PROVIDER_SITE_OTHER): Payer: 59 | Admitting: Family Medicine

## 2021-08-02 VITALS — BP 100/70 | HR 72 | Temp 98.8°F | Ht 64.5 in | Wt 208.0 lb

## 2021-08-02 DIAGNOSIS — L732 Hidradenitis suppurativa: Secondary | ICD-10-CM | POA: Diagnosis not present

## 2021-08-02 DIAGNOSIS — R5381 Other malaise: Secondary | ICD-10-CM

## 2021-08-02 DIAGNOSIS — R197 Diarrhea, unspecified: Secondary | ICD-10-CM

## 2021-08-02 LAB — POC COVID19 BINAXNOW: SARS Coronavirus 2 Ag: NEGATIVE

## 2021-08-02 MED ORDER — DOXYCYCLINE HYCLATE 100 MG PO TABS
100.0000 mg | ORAL_TABLET | Freq: Two times a day (BID) | ORAL | 0 refills | Status: DC
  Filled 2021-08-02: qty 20, 10d supply, fill #0

## 2021-08-02 NOTE — Patient Instructions (Signed)
Stay well hydrated. ?Use tylenol if needed for any fever or pain. ?Avoid dairy for a few days since you had some loose stools. ?Follow a bland diet (no fried, greasy or spicy foods) ? ?Take the doxycycline for the flare of HA.  Be sure to use sunscreen when outside. ?If the flare is worsening, followup with Dr. Claudia Desanctis. ?Continue to use warm compresses at least 3x/day. ? ?If you develop high fever, vomiting, worsening swelling/rash, or other new symptoms, please return for re-evaluation. ?

## 2021-08-02 NOTE — Progress Notes (Signed)
Chief Complaint  ?Patient presents with  ? Nausea  ?  Chills and nausea. Just feels bad. Very tired over the weekend. Has a boil under right arm, has surgery scheduled for 09/14/21 with Dr.Pace.   ? ?All weekend she felt tired. ?3am today she started with epigastric pain, nausea. ?She had a lot of gas, loose stools. ? ?Hydradenitis suppurativa started flaring Thursday, a new pocket, not draining from the same spot. ?She felt feverish (T98.3). ?She has a slight headache.  Hasn't eaten anything today.  Decreased appetite.  ?Nausea resolved (occurred 3-8am) ?No diarrhea since 5am. ?No longer has epigastric pain. ? ?Doesn't usually feel this bad from HS flares. ?Hasn't drained yet, doing warm showers. ? ?Last Rx was doxy from [facility] in 3/14 ?Had appt 4/6 with Dr. Horris Latino, no active infection. Planning for surgery in June. ? ?PMH, PSH, SH reviewed ?S/p BTL ?Quit smoking ? ?Outpatient Encounter Medications as of 08/02/2021  ?Medication Sig Note  ? cholecalciferol (VITAMIN D3) 25 MCG (1000 UNIT) tablet Take by mouth daily.   ? Magnesium 200 MG TABS Take 250 mg by mouth daily.   ? Multiple Vitamin (MULTIVITAMIN) tablet Take 1 tablet by mouth daily.   ? Multiple Vitamins-Minerals (ZINC PO) Take by mouth daily in the afternoon.   ? VITAMIN E PO Take 1 capsule by mouth daily.   ? [DISCONTINUED] Iron-Vit C-Vit B12-Folic Acid (IRON 650 PLUS PO)    ? acetaminophen (TYLENOL) 500 MG tablet Take 500 mg by mouth every 6 (six) hours as needed. (Patient not taking: Reported on 08/02/2021) 08/02/2021: prn  ? ibuprofen (ADVIL) 600 MG tablet Take 1 tablet (600 mg total) by mouth every 6 (six) hours as needed. (Patient not taking: Reported on 08/02/2021) 08/02/2021: prn  ? Omega-3 Fatty Acids (FISH OIL OMEGA-3 PO) Take 1 capsule by mouth daily. (Patient not taking: Reported on 08/02/2021)   ? ?No facility-administered encounter medications on file as of 08/02/2021.  ? ?Allergies  ?Allergen Reactions  ? Hydrocodone-Acetaminophen Nausea Only  ? Vicodin  [Hydrocodone-Acetaminophen]   ?  uneasy  ? ?ROS:  tactile fever/chills, fatigue. ?No URI symptoms. Some watery eyes.  No cough, shortness of breath, chest pain. ?Nausea/diarrhea per HPI. No chest pain. No bleeding, bruising, or rash other than the HS flare in R axilla. ? ? ?PHYSICAL EXAM: ? ?BP 100/70   Pulse 72   Temp 98.8 ?F (37.1 ?C) (Tympanic)   Ht 5' 4.5" (1.638 m)   Wt 208 lb (94.3 kg)   LMP 07/20/2021 (Exact Date)   BMI 35.15 kg/m?  ? ?Well-appearing female, appears slightly tired. ?HEENT: conjunctiva and sclera are clear, EOMI. ?Nose-- mild edematous mucus membranes, no drainage, sinuses nontender.  ?Op clear, no erythema, lesions, moist mucus membranes ?Neck: no lymphadenopathy ?Heart: regular rate and hrythm ?Lungs clear bilaterally ?Back: no CVA tenderness ?Skin:  R axilla: ?Ridge of tissue 4 x 1.5cm prominent ?2 cm section centrally is softer, tender, not quite fluctuant ?No active drainage ?Very mildly erythematous centrally ? ?Rapid COVID test was negative ? ? ?ASSESSMENT/PLAN: ? ?Malaise - unclear if could be from a viral syndrome, vs related to HS flare. Supportive measures reviewed - Plan: POC COVID-19 ? ?Diarrhea, unspecified type - BRAT diet, avoid dairy. Nausea has improved.  Suspect viral syndrome.  - Plan: POC COVID-19 ? ?Hidradenitis axillaris - flaring; treat with doxy. f/u with Dr. Claudia Desanctis if not improving. - Plan: doxycycline (VIBRA-TABS) 100 MG tablet ? ?Stay well hydrated. ?Use tylenol if needed for any fever or  pain. ?Avoid dairy for a few days since you had some loose stools. ?Follow a bland diet (no fried, greasy or spicy foods) ? ?Take the doxycycline for the flare of HA.  Be sure to use sunscreen when outside. ?If the flare is worsening, followup with Dr. Claudia Desanctis. ?Continue to use warm compresses at least 3x/day. ? ?If you develop high fever, vomiting, worsening swelling/rash, or other new symptoms, please return for re-evaluation. ? ?I spent 33 minutes dedicated to the care of this  patient, including pre-visit review of records, face to face time, post-visit ordering of testing and documentation. ? ?

## 2021-08-03 DIAGNOSIS — Z6835 Body mass index (BMI) 35.0-35.9, adult: Secondary | ICD-10-CM | POA: Diagnosis not present

## 2021-08-03 DIAGNOSIS — E042 Nontoxic multinodular goiter: Secondary | ICD-10-CM | POA: Diagnosis not present

## 2021-08-03 DIAGNOSIS — E669 Obesity, unspecified: Secondary | ICD-10-CM | POA: Diagnosis not present

## 2021-08-09 DIAGNOSIS — R7989 Other specified abnormal findings of blood chemistry: Secondary | ICD-10-CM | POA: Diagnosis not present

## 2021-08-09 DIAGNOSIS — N920 Excessive and frequent menstruation with regular cycle: Secondary | ICD-10-CM | POA: Diagnosis not present

## 2021-08-09 DIAGNOSIS — D508 Other iron deficiency anemias: Secondary | ICD-10-CM | POA: Diagnosis not present

## 2021-08-09 DIAGNOSIS — I5189 Other ill-defined heart diseases: Secondary | ICD-10-CM | POA: Diagnosis not present

## 2021-08-09 DIAGNOSIS — E042 Nontoxic multinodular goiter: Secondary | ICD-10-CM | POA: Diagnosis not present

## 2021-08-09 DIAGNOSIS — Z6835 Body mass index (BMI) 35.0-35.9, adult: Secondary | ICD-10-CM | POA: Diagnosis not present

## 2021-08-10 ENCOUNTER — Other Ambulatory Visit: Payer: Self-pay | Admitting: Medical

## 2021-08-10 MED ORDER — POLYSACCHARIDE IRON COMPLEX 150 MG PO CAPS: 150.0000 mg | ORAL_CAPSULE | Freq: Every day | ORAL | 0 refills | Status: DC

## 2021-08-26 ENCOUNTER — Encounter: Payer: Self-pay | Admitting: Surgical

## 2021-08-26 ENCOUNTER — Other Ambulatory Visit (HOSPITAL_COMMUNITY): Payer: Self-pay

## 2021-08-26 ENCOUNTER — Ambulatory Visit (INDEPENDENT_AMBULATORY_CARE_PROVIDER_SITE_OTHER): Payer: 59 | Admitting: Surgical

## 2021-08-26 VITALS — BP 122/84 | HR 75 | Ht 64.0 in | Wt 210.4 lb

## 2021-08-26 DIAGNOSIS — L732 Hidradenitis suppurativa: Secondary | ICD-10-CM

## 2021-08-26 MED ORDER — ONDANSETRON HCL 4 MG PO TABS
4.0000 mg | ORAL_TABLET | Freq: Three times a day (TID) | ORAL | 0 refills | Status: DC | PRN
  Filled 2021-08-26: qty 20, 7d supply, fill #0

## 2021-08-26 MED ORDER — OXYCODONE HCL 5 MG PO TABS
5.0000 mg | ORAL_TABLET | Freq: Four times a day (QID) | ORAL | 0 refills | Status: AC | PRN
  Filled 2021-08-26: qty 16, 4d supply, fill #0

## 2021-08-26 MED ORDER — DOXYCYCLINE HYCLATE 100 MG PO TABS
100.0000 mg | ORAL_TABLET | Freq: Two times a day (BID) | ORAL | 0 refills | Status: DC
  Filled 2021-08-26: qty 28, 14d supply, fill #0

## 2021-08-26 NOTE — H&P (View-Only) (Signed)
Patient ID: Jacqueline Winters, female    DOB: [birth year], 41 y.o.   MRN: [id number]  Chief Complaint  Patient presents with   Pre-op Exam      ICD-10-CM   1. Hidradenitis axillaris  L73.2       History of Present Illness: Jacqueline Winters is a 41 y.o.  female  with a history of hidradenitis.  She presents for preoperative evaluation for upcoming procedure, excision of hidradenitis of the right axilla with complex closure, scheduled for 09/14/2021 with Dr. Claudia Desanctis.  History of postoperative nausea vomiting after anesthesia for her gallbladder surgery.  Reports no other anesthetic issues with previous surgeries. No history of DVT/PE.  Reports cousins have history of blood clotting issues, no known personal blood clot disorders.  Patient is not currently taking any blood thinners.  No history of CVA/MI.   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  She had a normal echo with EF 60 to 65%, no gross structural abnormalities.  Cardiac monitor with no significant arrhythmias.  Of note she also had appointment with neurology, had normal EEG.  Patient reports overall she is feeling well lately, does report recent flareup of her hidradenitis within the right axilla within the last month, had started doxycycline and this seemed to improve.  No active flareup at this time.  She also reports that she was recently seen by her PCP for low iron, started on Nu-iron and is going to have this checked once more prior to surgery.   Past Medical History: Allergies: Allergies  Allergen Reactions   Hydrocodone-Acetaminophen Nausea Only   Vicodin [Hydrocodone-Acetaminophen]     uneasy    Current Medications:  Current Outpatient Medications:    acetaminophen (TYLENOL) 500 MG tablet, Take  500 mg by mouth every 6 (six) hours as needed., Disp: , Rfl:    cholecalciferol (VITAMIN D3) 25 MCG (1000 UNIT) tablet, Take by mouth daily., Disp: , Rfl:    doxycycline (VIBRA-TABS) 100 MG tablet, Take 1 tablet (100 mg total) by mouth 2 (two) times daily for 14 days., Disp: 28 tablet, Rfl: 0   ibuprofen (ADVIL) 600 MG tablet, Take 1 tablet (600 mg total) by mouth every 6 (six) hours as needed., Disp: 30 tablet, Rfl: 0   iron polysaccharides (NIFEREX) 150 MG capsule, Take 1 capsule (150 mg total) by mouth daily., Disp: 90 capsule, Rfl: 0   Magnesium 200 MG TABS, Take 250 mg by mouth daily., Disp: , Rfl:    Multiple Vitamin (MULTIVITAMIN) tablet, Take 1 tablet by mouth daily., Disp: , Rfl:    Multiple Vitamins-Minerals (ZINC PO), Take by mouth daily in the afternoon., Disp: , Rfl:    Omega-3 Fatty Acids (FISH OIL OMEGA-3 PO), Take 1 capsule by mouth daily., Disp: , Rfl:    ondansetron (ZOFRAN) 4 MG tablet, Take 1 tablet (4 mg total) by mouth every 8 (eight) hours as needed for nausea or vomiting., Disp: 20 tablet, Rfl: 0   oxyCODONE (OXY IR/ROXICODONE) 5 MG immediate release tablet, Take 1 tablet (5 mg total) by mouth every 6 (six) hours as needed for up to 4 days for severe pain., Disp: 16 tablet, Rfl: 0   VITAMIN E PO, Take 1 capsule by mouth daily., Disp: , Rfl:   Past Medical Problems: Past  Medical History:  Diagnosis Date   Allergy    GERD (gastroesophageal reflux disease)    with pregnancy   Goiter    Headache(784.0)    Heart murmur    Hidradenitis axillaris    Impaired fasting blood sugar    Insomnia    Obesity    PONV (postoperative nausea and vomiting)    Thyroid nodule    biopsy 07/2013, consult with Dr. Iran Planas endocrinology   Tobacco use disorder     Past Surgical History: Past Surgical History:  Procedure Laterality Date   CESAREAN SECTION  12/21/2011   Procedure: CESAREAN SECTION;  Surgeon: Farrel Gobble. Harrington Challenger, MD;  Location: [facility];  Service: Obstetrics;  Laterality:  N/A;  Bilateral Tubal Ligation   CHOLECYSTECTOMY  2009   INCISION AND DRAINAGE     right axillary boils   PILONIDAL CYST / SINUS EXCISION  2008   [facility]   TUBAL LIGATION     VAGINAL DELIVERY  2002, 1998    Social History: Social History   Socioeconomic History   Marital status: Married    Spouse name: Not on file   Number of children: Not on file   Years of education: Not on file   Highest education level: Not on file  Occupational History   Occupation: nurse    Employer: [employer]  Tobacco Use   Smoking status: Former    Packs/day: 0.30    Years: 13.00    Pack years: 3.90    Types: Cigarettes    Quit date: 11/21/2020    Years since quitting: 0.7   Smokeless tobacco: Never  Vaping Use   Vaping Use: Never used  Substance and Sexual Activity   Alcohol use: Yes    Comment: occasional   Drug use: No   Sexual activity: Yes    Partners: Male    Birth control/protection: Surgical  Other Topics Concern   Not on file  Social History Narrative   Married, 3 children.  Christian, exercises - zumba, fit camp.  Prior travel nursing.  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     01/2021   Social Determinants of Health   Financial Resource Strain: Not on file  Food Insecurity: Not on file  Transportation Needs: Not on file  Physical Activity: Not on file  Stress: Not on file  Social Connections: Not on file  Intimate Partner Violence: Not on file    Family History: Family History  Problem Relation Age of Onset   Hypertension Mother    Diabetes Mother    Hypertension Sister    Cancer Maternal Grandmother        colon, ovarian   Hypertension Sister    Arthritis Father    Cancer Maternal Aunt        breast   Heart disease Neg Hx    Stroke Neg Hx     Review of Systems: Review of Systems  Constitutional: Negative.   Respiratory:  Negative for shortness of breath.   Cardiovascular:  Negative for  chest pain and palpitations.  Gastrointestinal: Negative.   Skin: Negative.   Neurological:  Positive for weakness. Negative for dizziness.   Physical Exam: Vital Signs BP 122/84 (BP Location: Left Arm, Patient Position: Sitting, Cuff Size: Large)   Pulse 75   Ht '5\' 4"'$  (1.626 m)   Wt 210 lb 6.4 oz (95.4 kg)   SpO2 99%   BMI 36.12 kg/m   Physical  Exam  Constitutional:      General: Not in acute distress.    Appearance: Normal appearance. Not ill-appearing.  HENT:     Head: Normocephalic and atraumatic.  Eyes:     Pupils: Pupils are equal, round Neck:     Musculoskeletal: Normal range of motion.  Cardiovascular:     Rate and Rhythm: Normal rate    Pulses: Normal pulses.  Pulmonary:     Effort: Pulmonary effort is normal. No respiratory distress.  Abdominal:     General: Abdomen is flat. There is no distension.  Musculoskeletal: Normal range of motion.  Skin:    General: Skin is warm and dry.  Right axillary scarring noted from hidradenitis.  No active swelling or erythema noted.    Findings: No erythema or rash.  Neurological:     General: No focal deficit present.     Mental Status: Alert and oriented to person, place, and time. Mental status is at baseline.     Motor: No weakness.  Psychiatric:        Mood and Affect: Mood normal.        Behavior: Behavior normal.    Assessment/Plan: The patient is scheduled for excision of right axilla hidradenitis with Dr. Claudia Desanctis.  Risks, benefits, and alternatives of procedure discussed, questions answered and consent obtained.    Smoking Status: Quit smoking 11/2020; Counseling Given?  We discussed increased risk of postoperative complications with the use of any tobacco or nicotine products, advised patient to hold any nicotine containing products including gum products.  Caprini Score: 2, low; Risk Factors include: BMI greater than 25, and length of planned surgery. Recommendation for mechanical prophylaxis. Encourage early  ambulation.   Pictures obtained: Pictures were obtained of the patient and placed in the chart with the patient's or guardian's permission.  Post-op Rx sent to pharmacy:  Oxycodone, Zofran, doxycycline (start 1 week prior to surgery and continue 1 week postoperatively)  Patient was provided with the General Surgical Risk consent document and Pain Medication Agreement prior to their appointment.  They had adequate time to read through the risk consent documents and Pain Medication Agreement. We also discussed them in person together during this preop appointment. All of their questions were answered to their satisfaction.  Recommended calling if they have any further questions.  Risk consent form and Pain Medication Agreement to be scanned into patient's chart.  We discussed the risks specifically associated with excision of right axillary hidradenitis including the possibility for infection, bleeding, inability to completely excise hidradenitis, need for additional procedures.   Electronically signed by: Carola Rhine Nonna Renninger, PA-C 08/26/2021 9:40 AM

## 2021-08-26 NOTE — Progress Notes (Signed)
Patient ID: Jacqueline Winters, female    DOB: 22-Jun-1980, 41 y.o.   MRN: [id number]  Chief Complaint  Patient presents with   Pre-op Exam      ICD-10-CM   1. Hidradenitis axillaris  L73.2       History of Present Illness: Jacqueline Winters is a 41 y.o.  female  with a history of hidradenitis.  She presents for preoperative evaluation for upcoming procedure, excision of hidradenitis of the right axilla with complex closure, scheduled for 09/14/2021 with Dr. Claudia Desanctis.  History of postoperative nausea vomiting after anesthesia for her gallbladder surgery.  Reports no other anesthetic issues with previous surgeries. No history of DVT/PE.  Reports cousins have history of blood clotting issues, no known personal blood clot disorders.  Patient is not currently taking any blood thinners.  No history of CVA/MI.   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  She had a normal echo with EF 60 to 65%, no gross structural abnormalities.  Cardiac monitor with no significant arrhythmias.  Of note she also had appointment with neurology, had normal EEG.  Patient reports overall she is feeling well lately, does report recent flareup of her hidradenitis within the right axilla within the last month, had started doxycycline and this seemed to improve.  No active flareup at this time.  She also reports that she was recently seen by her PCP for low iron, started on Nu-iron and is going to have this checked once more prior to surgery.   Past Medical History: Allergies: Allergies  Allergen Reactions   Hydrocodone-Acetaminophen Nausea Only   Vicodin [Hydrocodone-Acetaminophen]     uneasy    Current Medications:  Current Outpatient Medications:    acetaminophen (TYLENOL) 500 MG tablet, Take  500 mg by mouth every 6 (six) hours as needed., Disp: , Rfl:    cholecalciferol (VITAMIN D3) 25 MCG (1000 UNIT) tablet, Take by mouth daily., Disp: , Rfl:    doxycycline (VIBRA-TABS) 100 MG tablet, Take 1 tablet (100 mg total) by mouth 2 (two) times daily for 14 days., Disp: 28 tablet, Rfl: 0   ibuprofen (ADVIL) 600 MG tablet, Take 1 tablet (600 mg total) by mouth every 6 (six) hours as needed., Disp: 30 tablet, Rfl: 0   iron polysaccharides (NIFEREX) 150 MG capsule, Take 1 capsule (150 mg total) by mouth daily., Disp: 90 capsule, Rfl: 0   Magnesium 200 MG TABS, Take 250 mg by mouth daily., Disp: , Rfl:    Multiple Vitamin (MULTIVITAMIN) tablet, Take 1 tablet by mouth daily., Disp: , Rfl:    Multiple Vitamins-Minerals (ZINC PO), Take by mouth daily in the afternoon., Disp: , Rfl:    Omega-3 Fatty Acids (FISH OIL OMEGA-3 PO), Take 1 capsule by mouth daily., Disp: , Rfl:    ondansetron (ZOFRAN) 4 MG tablet, Take 1 tablet (4 mg total) by mouth every 8 (eight) hours as needed for nausea or vomiting., Disp: 20 tablet, Rfl: 0   oxyCODONE (OXY IR/ROXICODONE) 5 MG immediate release tablet, Take 1 tablet (5 mg total) by mouth every 6 (six) hours as needed for up to 4 days for severe pain., Disp: 16 tablet, Rfl: 0   VITAMIN E PO, Take 1 capsule by mouth daily., Disp: , Rfl:   Past Medical Problems: Past  Medical History:  Diagnosis Date   Allergy    GERD (gastroesophageal reflux disease)    with pregnancy   Goiter    Headache(784.0)    Heart murmur    Hidradenitis axillaris    Impaired fasting blood sugar    Insomnia    Obesity    PONV (postoperative nausea and vomiting)    Thyroid nodule    biopsy 07/2013, consult with Dr. Iran Planas endocrinology   Tobacco use disorder     Past Surgical History: Past Surgical History:  Procedure Laterality Date   CESAREAN SECTION  12/21/2011   Procedure: CESAREAN SECTION;  Surgeon: Farrel Gobble. Harrington Challenger, MD;  Location: [facility];  Service: Obstetrics;  Laterality:  N/A;  Bilateral Tubal Ligation   CHOLECYSTECTOMY  2009   INCISION AND DRAINAGE     right axillary boils   PILONIDAL CYST / SINUS EXCISION  2008   [facility]   TUBAL LIGATION     VAGINAL DELIVERY  2002, 1998    Social History: Social History   Socioeconomic History   Marital status: Married    Spouse name: Not on file   Number of children: Not on file   Years of education: Not on file   Highest education level: Not on file  Occupational History   Occupation: nurse    Employer: [employer]  Tobacco Use   Smoking status: Former    Packs/day: 0.30    Years: 13.00    Pack years: 3.90    Types: Cigarettes    Quit date: 11/21/2020    Years since quitting: 0.7   Smokeless tobacco: Never  Vaping Use   Vaping Use: Never used  Substance and Sexual Activity   Alcohol use: Yes    Comment: occasional   Drug use: No   Sexual activity: Yes    Partners: Male    Birth control/protection: Surgical  Other Topics Concern   Not on file  Social History Narrative   Married, 3 children.  Christian, exercises - zumba, fit camp.  Prior travel nursing.  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     01/2021   Social Determinants of Health   Financial Resource Strain: Not on file  Food Insecurity: Not on file  Transportation Needs: Not on file  Physical Activity: Not on file  Stress: Not on file  Social Connections: Not on file  Intimate Partner Violence: Not on file    Family History: Family History  Problem Relation Age of Onset   Hypertension Mother    Diabetes Mother    Hypertension Sister    Cancer Maternal Grandmother        colon, ovarian   Hypertension Sister    Arthritis Father    Cancer Maternal Aunt        breast   Heart disease Neg Hx    Stroke Neg Hx     Review of Systems: Review of Systems  Constitutional: Negative.   Respiratory:  Negative for shortness of breath.   Cardiovascular:  Negative for  chest pain and palpitations.  Gastrointestinal: Negative.   Skin: Negative.   Neurological:  Positive for weakness. Negative for dizziness.   Physical Exam: Vital Signs BP 122/84 (BP Location: Left Arm, Patient Position: Sitting, Cuff Size: Large)   Pulse 75   Ht '5\' 4"'$  (1.626 m)   Wt 210 lb 6.4 oz (95.4 kg)   SpO2 99%   BMI 36.12 kg/m   Physical  Exam  Constitutional:      General: Not in acute distress.    Appearance: Normal appearance. Not ill-appearing.  HENT:     Head: Normocephalic and atraumatic.  Eyes:     Pupils: Pupils are equal, round Neck:     Musculoskeletal: Normal range of motion.  Cardiovascular:     Rate and Rhythm: Normal rate    Pulses: Normal pulses.  Pulmonary:     Effort: Pulmonary effort is normal. No respiratory distress.  Abdominal:     General: Abdomen is flat. There is no distension.  Musculoskeletal: Normal range of motion.  Skin:    General: Skin is warm and dry.  Right axillary scarring noted from hidradenitis.  No active swelling or erythema noted.    Findings: No erythema or rash.  Neurological:     General: No focal deficit present.     Mental Status: Alert and oriented to person, place, and time. Mental status is at baseline.     Motor: No weakness.  Psychiatric:        Mood and Affect: Mood normal.        Behavior: Behavior normal.    Assessment/Plan: The patient is scheduled for excision of right axilla hidradenitis with Dr. Claudia Desanctis.  Risks, benefits, and alternatives of procedure discussed, questions answered and consent obtained.    Smoking Status: Quit smoking 11/2020; Counseling Given?  We discussed increased risk of postoperative complications with the use of any tobacco or nicotine products, advised patient to hold any nicotine containing products including gum products.  Caprini Score: 2, low; Risk Factors include: BMI greater than 25, and length of planned surgery. Recommendation for mechanical prophylaxis. Encourage early  ambulation.   Pictures obtained: Pictures were obtained of the patient and placed in the chart with the patient's or guardian's permission.  Post-op Rx sent to pharmacy:  Oxycodone, Zofran, doxycycline (start 1 week prior to surgery and continue 1 week postoperatively)  Patient was provided with the General Surgical Risk consent document and Pain Medication Agreement prior to their appointment.  They had adequate time to read through the risk consent documents and Pain Medication Agreement. We also discussed them in person together during this preop appointment. All of their questions were answered to their satisfaction.  Recommended calling if they have any further questions.  Risk consent form and Pain Medication Agreement to be scanned into patient's chart.  We discussed the risks specifically associated with excision of right axillary hidradenitis including the possibility for infection, bleeding, inability to completely excise hidradenitis, need for additional procedures.   Electronically signed by: Carola Rhine Fatina Sprankle, PA-C 08/26/2021 9:40 AM

## 2021-08-27 ENCOUNTER — Other Ambulatory Visit: Payer: Self-pay | Admitting: Medical

## 2021-08-27 ENCOUNTER — Telehealth: Payer: Self-pay

## 2021-08-27 DIAGNOSIS — E611 Iron deficiency: Secondary | ICD-10-CM

## 2021-08-27 NOTE — Telephone Encounter (Signed)
Pt made her recheck appointment . Will need future order to repeat her iron level. [place]

## 2021-08-27 NOTE — Progress Notes (Signed)
iron

## 2021-09-02 ENCOUNTER — Other Ambulatory Visit: Payer: Self-pay

## 2021-09-02 ENCOUNTER — Encounter (HOSPITAL_BASED_OUTPATIENT_CLINIC_OR_DEPARTMENT_OTHER): Payer: Self-pay | Admitting: Plastic Surgery

## 2021-09-02 NOTE — Progress Notes (Signed)
Surgical soap given with instructions, pt verbalized understanding.  

## 2021-09-07 ENCOUNTER — Other Ambulatory Visit: Payer: 59

## 2021-09-07 DIAGNOSIS — E611 Iron deficiency: Secondary | ICD-10-CM | POA: Diagnosis not present

## 2021-09-08 LAB — IRON: Iron: 27 ug/dL (ref 27–159)

## 2021-09-09 ENCOUNTER — Other Ambulatory Visit: Payer: Self-pay | Admitting: Medical

## 2021-09-09 DIAGNOSIS — E611 Iron deficiency: Secondary | ICD-10-CM

## 2021-09-13 ENCOUNTER — Other Ambulatory Visit: Payer: 59

## 2021-09-13 DIAGNOSIS — E611 Iron deficiency: Secondary | ICD-10-CM

## 2021-09-13 LAB — CBC WITH DIFFERENTIAL/PLATELET
Basophils Absolute: 0 10*3/uL (ref 0.0–0.2)
Basos: 1 %
EOS (ABSOLUTE): 0.1 10*3/uL (ref 0.0–0.4)
Eos: 1 %
Hematocrit: 33.8 % — ABNORMAL LOW (ref 34.0–46.6)
Hemoglobin: 11.5 g/dL (ref 11.1–15.9)
Immature Grans (Abs): 0 10*3/uL (ref 0.0–0.1)
Immature Granulocytes: 0 %
Lymphocytes Absolute: 2.1 10*3/uL (ref 0.7–3.1)
Lymphs: 30 %
MCH: 28 pg (ref 26.6–33.0)
MCHC: 34 g/dL (ref 31.5–35.7)
MCV: 82 fL (ref 79–97)
Monocytes Absolute: 0.6 10*3/uL (ref 0.1–0.9)
Monocytes: 8 %
Neutrophils Absolute: 4.1 10*3/uL (ref 1.4–7.0)
Neutrophils: 60 %
Platelets: 349 10*3/uL (ref 150–450)
RBC: 4.11 x10E6/uL (ref 3.77–5.28)
RDW: 13.8 % (ref 11.7–15.4)
WBC: 6.9 10*3/uL (ref 3.4–10.8)

## 2021-09-14 ENCOUNTER — Ambulatory Visit (HOSPITAL_BASED_OUTPATIENT_CLINIC_OR_DEPARTMENT_OTHER): Payer: 59 | Admitting: Anesthesiology

## 2021-09-14 ENCOUNTER — Ambulatory Visit (HOSPITAL_BASED_OUTPATIENT_CLINIC_OR_DEPARTMENT_OTHER)
Admission: RE | Admit: 2021-09-14 | Discharge: 2021-09-14 | Disposition: A | Discharge status: Home / Self Care | Payer: 59 | Attending: Plastic Surgery | Admitting: Plastic Surgery

## 2021-09-14 ENCOUNTER — Other Ambulatory Visit: Payer: Self-pay

## 2021-09-14 ENCOUNTER — Encounter (HOSPITAL_BASED_OUTPATIENT_CLINIC_OR_DEPARTMENT_OTHER)
Admission: RE | Disposition: A | Discharge status: Home / Self Care | Payer: Self-pay | Source: Home / Self Care | Attending: Plastic Surgery

## 2021-09-14 ENCOUNTER — Other Ambulatory Visit (HOSPITAL_COMMUNITY): Payer: Self-pay

## 2021-09-14 ENCOUNTER — Encounter (HOSPITAL_BASED_OUTPATIENT_CLINIC_OR_DEPARTMENT_OTHER): Payer: Self-pay | Admitting: Plastic Surgery

## 2021-09-14 DIAGNOSIS — E559 Vitamin D deficiency, unspecified: Secondary | ICD-10-CM | POA: Diagnosis not present

## 2021-09-14 DIAGNOSIS — L732 Hidradenitis suppurativa: Secondary | ICD-10-CM

## 2021-09-14 DIAGNOSIS — Z6836 Body mass index (BMI) 36.0-36.9, adult: Secondary | ICD-10-CM | POA: Diagnosis not present

## 2021-09-14 DIAGNOSIS — L089 Local infection of the skin and subcutaneous tissue, unspecified: Secondary | ICD-10-CM | POA: Diagnosis not present

## 2021-09-14 DIAGNOSIS — L02411 Cutaneous abscess of right axilla: Secondary | ICD-10-CM | POA: Diagnosis not present

## 2021-09-14 DIAGNOSIS — Z3A35 35 weeks gestation of pregnancy: Secondary | ICD-10-CM

## 2021-09-14 DIAGNOSIS — L720 Epidermal cyst: Secondary | ICD-10-CM | POA: Insufficient documentation

## 2021-09-14 DIAGNOSIS — Z01818 Encounter for other preprocedural examination: Secondary | ICD-10-CM

## 2021-09-14 DIAGNOSIS — Z6835 Body mass index (BMI) 35.0-35.9, adult: Secondary | ICD-10-CM | POA: Diagnosis not present

## 2021-09-14 HISTORY — PX: HYDRADENITIS EXCISION: SHX5243

## 2021-09-14 LAB — POCT PREGNANCY, URINE: Preg Test, Ur: NEGATIVE

## 2021-09-14 SURGERY — EXCISION, HIDRADENITIS, AXILLA
Anesthesia: General | Site: Axilla | Laterality: Right

## 2021-09-14 MED ORDER — GABAPENTIN 300 MG PO CAPS
ORAL_CAPSULE | ORAL | Status: AC
  Filled 2021-09-14: qty 1

## 2021-09-14 MED ORDER — OXYCODONE HCL 5 MG/5ML PO SOLN: 5.0000 mg | Freq: Once | ORAL | Status: DC | PRN

## 2021-09-14 MED ORDER — HYDROMORPHONE HCL 1 MG/ML IJ SOLN: 0.2500 mg | INTRAMUSCULAR | Status: DC | PRN

## 2021-09-14 MED ORDER — LIDOCAINE HCL (CARDIAC) PF 100 MG/5ML IV SOSY
PREFILLED_SYRINGE | INTRAVENOUS | Status: DC | PRN
  Administered 2021-09-14: 80 mg via INTRATRACHEAL

## 2021-09-14 MED ORDER — LACTATED RINGERS IV SOLN: INTRAVENOUS | Status: DC

## 2021-09-14 MED ORDER — CEFAZOLIN SODIUM-DEXTROSE 2-4 GM/100ML-% IV SOLN
2.0000 g | INTRAVENOUS | Status: AC
  Administered 2021-09-14: 2 g via INTRAVENOUS

## 2021-09-14 MED ORDER — ONDANSETRON HCL 4 MG/2ML IJ SOLN
INTRAMUSCULAR | Status: AC
  Filled 2021-09-14: qty 2

## 2021-09-14 MED ORDER — PROPOFOL 10 MG/ML IV BOLUS
INTRAVENOUS | Status: DC | PRN
  Administered 2021-09-14: 200 mg via INTRAVENOUS

## 2021-09-14 MED ORDER — ONDANSETRON HCL 4 MG/2ML IJ SOLN
INTRAMUSCULAR | Status: DC | PRN
  Administered 2021-09-14: 4 mg via INTRAVENOUS

## 2021-09-14 MED ORDER — BUPIVACAINE-EPINEPHRINE (PF) 0.5% -1:200000 IJ SOLN
INTRAMUSCULAR | Status: AC
  Filled 2021-09-14: qty 30

## 2021-09-14 MED ORDER — DEXAMETHASONE SODIUM PHOSPHATE 10 MG/ML IJ SOLN
INTRAMUSCULAR | Status: AC
  Filled 2021-09-14: qty 1

## 2021-09-14 MED ORDER — CHLORHEXIDINE GLUCONATE CLOTH 2 % EX PADS: 6.0000 | MEDICATED_PAD | Freq: Once | CUTANEOUS | Status: DC

## 2021-09-14 MED ORDER — ACETAMINOPHEN 500 MG PO TABS
ORAL_TABLET | ORAL | Status: AC
  Filled 2021-09-14: qty 2

## 2021-09-14 MED ORDER — GLYCOPYRROLATE 0.2 MG/ML IJ SOLN
INTRAMUSCULAR | Status: DC | PRN
  Administered 2021-09-14: .1 mg via INTRAVENOUS

## 2021-09-14 MED ORDER — FENTANYL CITRATE (PF) 100 MCG/2ML IJ SOLN
INTRAMUSCULAR | Status: DC | PRN
  Administered 2021-09-14 (×4): 25 ug via INTRAVENOUS

## 2021-09-14 MED ORDER — FLUCONAZOLE 100 MG PO TABS
100.0000 mg | ORAL_TABLET | Freq: Every day | ORAL | 0 refills | Status: DC
  Filled 2021-09-14: qty 2, 2d supply, fill #0

## 2021-09-14 MED ORDER — MIDAZOLAM HCL 2 MG/2ML IJ SOLN
INTRAMUSCULAR | Status: DC | PRN
  Administered 2021-09-14: 2 mg via INTRAVENOUS

## 2021-09-14 MED ORDER — ACETAMINOPHEN 500 MG PO TABS
1000.0000 mg | ORAL_TABLET | Freq: Once | ORAL | Status: AC
Start: 2021-09-14 — End: 2021-09-14
  Administered 2021-09-14: 1000 mg via ORAL

## 2021-09-14 MED ORDER — OXYCODONE HCL 5 MG PO TABS: 5.0000 mg | ORAL_TABLET | Freq: Once | ORAL | Status: DC | PRN

## 2021-09-14 MED ORDER — GLYCOPYRROLATE PF 0.2 MG/ML IJ SOSY
PREFILLED_SYRINGE | INTRAMUSCULAR | Status: AC
  Filled 2021-09-14: qty 1

## 2021-09-14 MED ORDER — FENTANYL CITRATE (PF) 100 MCG/2ML IJ SOLN
INTRAMUSCULAR | Status: AC
  Filled 2021-09-14: qty 2

## 2021-09-14 MED ORDER — AMISULPRIDE (ANTIEMETIC) 5 MG/2ML IV SOLN: 10.0000 mg | Freq: Once | INTRAVENOUS | Status: DC | PRN

## 2021-09-14 MED ORDER — MIDAZOLAM HCL 2 MG/2ML IJ SOLN
INTRAMUSCULAR | Status: AC
  Filled 2021-09-14: qty 2

## 2021-09-14 MED ORDER — BUPIVACAINE-EPINEPHRINE (PF) 0.25% -1:200000 IJ SOLN
INTRAMUSCULAR | Status: AC
  Filled 2021-09-14: qty 30

## 2021-09-14 MED ORDER — LIDOCAINE 2% (20 MG/ML) 5 ML SYRINGE
INTRAMUSCULAR | Status: AC
  Filled 2021-09-14: qty 5

## 2021-09-14 MED ORDER — LIDOCAINE HCL 1 % IJ SOLN
INTRAMUSCULAR | Status: DC | PRN
  Administered 2021-09-14: 30 mL via INTRAMUSCULAR

## 2021-09-14 MED ORDER — PROPOFOL 10 MG/ML IV BOLUS
INTRAVENOUS | Status: AC
  Filled 2021-09-14: qty 20

## 2021-09-14 MED ORDER — GABAPENTIN 300 MG PO CAPS
300.0000 mg | ORAL_CAPSULE | Freq: Once | ORAL | Status: AC
  Administered 2021-09-14: 300 mg via ORAL

## 2021-09-14 MED ORDER — METOPROLOL TARTRATE 5 MG/5ML IV SOLN
INTRAVENOUS | Status: DC | PRN
  Administered 2021-09-14: 1 mg via INTRAVENOUS

## 2021-09-14 MED ORDER — CEFAZOLIN SODIUM-DEXTROSE 2-4 GM/100ML-% IV SOLN
INTRAVENOUS | Status: AC
  Filled 2021-09-14: qty 100

## 2021-09-14 MED ORDER — DEXAMETHASONE SODIUM PHOSPHATE 10 MG/ML IJ SOLN
INTRAMUSCULAR | Status: DC | PRN
  Administered 2021-09-14: 5 mg via INTRAVENOUS

## 2021-09-14 SURGICAL SUPPLY — 66 items
APL PRP STRL LF DISP 70% ISPRP (MISCELLANEOUS) ×1
APL SKNCLS STERI-STRIP NONHPOA (GAUZE/BANDAGES/DRESSINGS)
BAND INSRT 18 STRL LF DISP RB (MISCELLANEOUS)
BAND RUBBER #18 3X1/16 STRL (MISCELLANEOUS) IMPLANT
BENZOIN TINCTURE PRP APPL 2/3 (GAUZE/BANDAGES/DRESSINGS) IMPLANT
BLADE CLIPPER SURG (BLADE) IMPLANT
BLADE SURG 15 STRL LF DISP TIS (BLADE) ×1 IMPLANT
BLADE SURG 15 STRL SS (BLADE) ×2
CANISTER SUCT 1200ML W/VALVE (MISCELLANEOUS) IMPLANT
CHLORAPREP W/TINT 26 (MISCELLANEOUS) ×2 IMPLANT
COVER BACK TABLE 60X90IN (DRAPES) ×2 IMPLANT
COVER MAYO STAND STRL (DRAPES) ×2 IMPLANT
DRAIN JP 10F RND SILICONE (MISCELLANEOUS) IMPLANT
DRAPE LAPAROTOMY 100X72 PEDS (DRAPES) IMPLANT
DRAPE U-SHAPE 76X120 STRL (DRAPES) IMPLANT
DRAPE UTILITY XL STRL (DRAPES) ×2 IMPLANT
DRSG TELFA 3X8 NADH (GAUZE/BANDAGES/DRESSINGS) IMPLANT
ELECT COATED BLADE 2.86 ST (ELECTRODE) IMPLANT
ELECT NDL BLADE 2-5/6 (NEEDLE) ×1 IMPLANT
ELECT NEEDLE BLADE 2-5/6 (NEEDLE) ×2 IMPLANT
ELECT REM PT RETURN 9FT ADLT (ELECTROSURGICAL)
ELECT REM PT RETURN 9FT PED (ELECTROSURGICAL)
ELECTRODE REM PT RETRN 9FT PED (ELECTROSURGICAL) IMPLANT
ELECTRODE REM PT RTRN 9FT ADLT (ELECTROSURGICAL) IMPLANT
EVACUATOR SILICONE 100CC (DRAIN) IMPLANT
GAUZE SPONGE 4X4 12PLY STRL LF (GAUZE/BANDAGES/DRESSINGS) IMPLANT
GAUZE XEROFORM 1X8 LF (GAUZE/BANDAGES/DRESSINGS) IMPLANT
GLOVE BIOGEL M STRL SZ7.5 (GLOVE) ×2 IMPLANT
GLOVE BIOGEL PI IND STRL 8 (GLOVE) ×1 IMPLANT
GLOVE BIOGEL PI INDICATOR 8 (GLOVE) ×1
GOWN STRL REUS W/ TWL LRG LVL3 (GOWN DISPOSABLE) ×2 IMPLANT
GOWN STRL REUS W/TWL LRG LVL3 (GOWN DISPOSABLE) ×4
NDL HYPO 27GX1-1/4 (NEEDLE) ×1 IMPLANT
NDL HYPO 30GX1 BEV (NEEDLE) IMPLANT
NDL SPNL 18GX3.5 QUINCKE PK (NEEDLE) IMPLANT
NEEDLE HYPO 27GX1-1/4 (NEEDLE) ×2 IMPLANT
NEEDLE HYPO 30GX1 BEV (NEEDLE) IMPLANT
NEEDLE SPNL 18GX3.5 QUINCKE PK (NEEDLE) IMPLANT
NS IRRIG 1000ML POUR BTL (IV SOLUTION) IMPLANT
PACK BASIN DAY SURGERY FS (CUSTOM PROCEDURE TRAY) ×2 IMPLANT
PAD DRESSING TELFA 3X8 NADH (GAUZE/BANDAGES/DRESSINGS) IMPLANT
PENCIL SMOKE EVACUATOR (MISCELLANEOUS) ×2 IMPLANT
SHEET MEDIUM DRAPE 40X70 STRL (DRAPES) IMPLANT
SLEEVE SCD COMPRESS KNEE MED (STOCKING) IMPLANT
SPONGE GAUZE 2X2 8PLY STRL LF (GAUZE/BANDAGES/DRESSINGS) IMPLANT
SPONGE T-LAP 18X18 ~~LOC~~+RFID (SPONGE) IMPLANT
STAPLER VISISTAT 35W (STAPLE) ×2 IMPLANT
STRIP CLOSURE SKIN 1/2X4 (GAUZE/BANDAGES/DRESSINGS) IMPLANT
SUCTION FRAZIER HANDLE 10FR (MISCELLANEOUS)
SUCTION TUBE FRAZIER 10FR DISP (MISCELLANEOUS) IMPLANT
SUT ETHILON 4 0 PS 2 18 (SUTURE) IMPLANT
SUT MNCRL AB 4-0 PS2 18 (SUTURE) ×1 IMPLANT
SUT MON AB 5-0 P3 18 (SUTURE) IMPLANT
SUT PDS 3-0 CT2 (SUTURE) ×2
SUT PDS II 3-0 CT2 27 ABS (SUTURE) IMPLANT
SUT PLAIN 5 0 P 3 18 (SUTURE) IMPLANT
SUT PROLENE 5 0 P 3 (SUTURE) IMPLANT
SUT VICRYL 4-0 PS2 18IN ABS (SUTURE) IMPLANT
SWAB COLLECTION DEVICE MRSA (MISCELLANEOUS) IMPLANT
SWAB CULTURE ESWAB REG 1ML (MISCELLANEOUS) IMPLANT
SYR BULB EAR ULCER 3OZ GRN STR (SYRINGE) IMPLANT
SYR CONTROL 10ML LL (SYRINGE) ×2 IMPLANT
TOWEL GREEN STERILE FF (TOWEL DISPOSABLE) ×2 IMPLANT
TRAY DSU PREP LF (CUSTOM PROCEDURE TRAY) IMPLANT
TUBE CONNECTING 20X1/4 (TUBING) IMPLANT
TUBING INFILTRATION IT-10001 (TUBING) IMPLANT

## 2021-09-14 NOTE — Discharge Instructions (Addendum)
Activity As tolerated. NO showers for 24 hours.  NO driving while in pain, taking pain medication or if you are unable to safely react to traffic. No heavy activities Take Pain medication (Oxycodone) as needed for severe pain. Otherwise, you can use ibuprofen or tylenol as needed. Avoid more than 3,000 mg of tylenol in 24 hours.  Continue doxycycline antibiotic for 1 week.  Diet: Regular. Drink plenty of fluids and eat healthy (high protein, low carbs), Try to optimize your nutrition with plenty of fruits and vegetables to improve healing. Protein shakes are a good option.  Wound Care: Keep dressing clean & dry. May remove bandage after 24hrs to shower. Replace dressing as needed. Mild wound drainage is common after breast reduction surgery and should not be cause for alarm.  Special Instructions: Call Doctor if any unusual problems occur such as pain, excessive Bleeding, unrelieved Nausea/vomiting, Fever &/or chills  Follow-up appointment: Previously scheduled.  No Tylenol before 4pm 09/14/21.   Post Anesthesia Home Care Instructions  Activity: Get plenty of rest for the remainder of the day. A responsible individual must stay with you for 24 hours following the procedure.  For the next 24 hours, DO NOT: -Drive a car -Paediatric nurse -Drink alcoholic beverages -Take any medication unless instructed by your physician -Make any legal decisions or sign important papers.  Meals: Start with liquid foods such as gelatin or soup. Progress to regular foods as tolerated. Avoid greasy, spicy, heavy foods. If nausea and/or vomiting occur, drink only clear liquids until the nausea and/or vomiting subsides. Call your physician if vomiting continues.  Special Instructions/Symptoms: Your throat may feel dry or sore from the anesthesia or the breathing tube placed in your throat during surgery. If this causes discomfort, gargle with warm salt water. The discomfort should disappear within 24  hours.  If you had a scopolamine patch placed behind your ear for the management of post- operative nausea and/or vomiting:  1. The medication in the patch is effective for 72 hours, after which it should be removed.  Wrap patch in a tissue and discard in the trash. Wash hands thoroughly with soap and water. 2. You may remove the patch earlier than 72 hours if you experience unpleasant side effects which may include dry mouth, dizziness or visual disturbances. 3. Avoid touching the patch. Wash your hands with soap and water after contact with the patch.

## 2021-09-14 NOTE — Op Note (Signed)
Operative Note   DATE OF OPERATION: 09/14/2021  SURGICAL DEPARTMENT: Plastic Surgery  PREOPERATIVE DIAGNOSES: Right axillary hidradenitis  POSTOPERATIVE DIAGNOSES:  same  PROCEDURE: 1.  Excision right axillary hidradenitis totaling 8 cm 2.  Complex closure right axilla totaling 8 cm  SURGEON: Talmadge Coventry, MD  ASSISTANT: Verdie Shire, PA The advanced practice practitioner (APP) assisted throughout the case.  The APP was essential in retraction and counter traction when needed to make the case progress smoothly.  This retraction and assistance made it possible to see the tissue planes for the procedure.  The assistance was needed for hemostasis, tissue re-approximation and closure of the incision site.   ANESTHESIA:  General.   COMPLICATIONS: None.   INDICATIONS FOR PROCEDURE:  The patient, Jacqueline Winters is a 41 y.o. female born on 1980-06-16, is here for treatment of right axillary hidradenitis MRN: [id number]  CONSENT:  Informed consent was obtained directly from the patient. Risks, benefits and alternatives were fully discussed. Specific risks including but not limited to bleeding, infection, hematoma, seroma, scarring, pain, contracture, asymmetry, wound healing problems, and need for further surgery were all discussed. The patient did have an ample opportunity to have questions answered to satisfaction.   DESCRIPTION OF PROCEDURE:  The patient was taken to the operating room. SCDs were placed and antibiotics were given.  General anesthesia was administered.  The patient's operative site was prepped and draped in a sterile fashion. A time out was performed and all information was confirmed to be correct.  Started by marking out a border around the diseased tissue.  This generated an ellipse that was about 8 x 3 cm.  I then infiltrated Marcaine with epinephrine for hemostatic and local anesthetic effect.  This was given time to work.  The tissue was then excised with a knife and  cautery.  Hemostasis was ensured.  Surrounding skin was undermined advanced and closed in layers with interrupted buried 3-0 PDS and running 4-0 Monocryl sutures.  Soft dressing was applied.  The patient tolerated the procedure well.  There were no complications. The patient was allowed to wake from anesthesia, extubated and taken to the recovery room in satisfactory condition.

## 2021-09-14 NOTE — Anesthesia Preprocedure Evaluation (Addendum)
Anesthesia Evaluation  Patient identified by MRN, date of birth, ID band Patient awake    Reviewed: Allergy & Precautions, H&P , NPO status , Patient's Chart, lab work & pertinent test results  History of Anesthesia Complications (+) PONV and history of anesthetic complications (denies)  Airway Mallampati: II  TM Distance: >3 FB Neck ROM: Full    Dental no notable dental hx. (+) Teeth Intact, Dental Advisory Given   Pulmonary shortness of breath (with pregnancy), Current Smoker, former smoker,    Pulmonary exam normal breath sounds clear to auscultation       Cardiovascular + Valvular Problems/Murmurs MR  Rhythm:Regular Rate:Normal + Systolic murmurs Echo 04/7791 1. Left ventricular ejection fraction, by estimation, is 60 to 65%. The left ventricle has normal function. The left ventricle has no regional wall motion abnormalities. Left ventricular diastolic parameters are consistent with Grade I diastolic dysfunction (impaired relaxation).  2. Right ventricular systolic function is normal. The right ventricular size is normal. There is normal pulmonary artery systolic pressure. The estimated right ventricular systolic pressure is 90.3 mmHg.  3. The mitral valve is normal in structure. Mild mitral valve  regurgitation. No evidence of mitral stenosis.  4. The aortic valve is normal in structure. Aortic valve regurgitation is not visualized. No aortic stenosis is present.  5. The inferior vena cava is normal in size with greater than 50% respiratory variability, suggesting right atrial pressure of 3 mmHg.    Neuro/Psych  Headaches, negative psych ROS   GI/Hepatic Neg liver ROS, GERD (with pregnancy)  ,  Endo/Other  Morbid obesity  Renal/GU negative Renal ROS     Musculoskeletal   Abdominal (+) + obese,   Peds  Hematology negative hematology ROS (+)   Anesthesia Other Findings Ate at 9 pm  Reproductive/Obstetrics (+)  Pregnancy (breech, ruptured in labor)                            Anesthesia Physical  Anesthesia Plan  ASA: 3  Anesthesia Plan: General   Post-op Pain Management: Tylenol PO (pre-op)* and Gabapentin PO (pre-op)*   Induction:   PONV Risk Score and Plan: Ondansetron, Dexamethasone, Treatment may vary due to age or medical condition and Midazolam  Airway Management Planned: LMA and Oral ETT  Additional Equipment:   Intra-op Plan:   Post-operative Plan: Extubation in OR  Informed Consent: I have reviewed the patients History and Physical, chart, labs and discussed the procedure including the risks, benefits and alternatives for the proposed anesthesia with the patient or authorized representative who has indicated his/her understanding and acceptance.     Dental advisory given  Plan Discussed with: CRNA  Anesthesia Plan Comments:        Anesthesia Quick Evaluation

## 2021-09-14 NOTE — Interval H&P Note (Signed)
History and Physical Interval Note:  09/14/2021 10:57 AM  Jacqueline Winters  has presented today for surgery, with the diagnosis of Hidradenitis axillae.  The various methods of treatment have been discussed with the patient and family. After consideration of risks, benefits and other options for treatment, the patient has consented to  Procedure(s) with comments: EXCISION HIDRADENITIS AXILLA WITH COMPLEX CLOSURE (Right) - 45 mins as a surgical intervention.  The patient's history has been reviewed, patient examined, no change in status, stable for surgery.  I have reviewed the patient's chart and labs.  Questions were answered to the patient's satisfaction.     Cindra Presume

## 2021-09-14 NOTE — Anesthesia Procedure Notes (Signed)
Procedure Name: LMA Insertion Date/Time: 09/14/2021 11:47 AM Performed by: Collier Bullock, CRNA Pre-anesthesia Checklist: Patient identified, Emergency Drugs available, Suction available and Patient being monitored Patient Re-evaluated:Patient Re-evaluated prior to induction Oxygen Delivery Method: Circle system utilized Preoxygenation: Pre-oxygenation with 100% oxygen Induction Type: IV induction Ventilation: Mask ventilation without difficulty LMA: LMA inserted LMA Size: 4.0 Number of attempts: 1 Placement Confirmation: ETT inserted through vocal cords under direct vision, positive ETCO2 and breath sounds checked- equal and bilateral Tube secured with: Tape

## 2021-09-14 NOTE — Anesthesia Postprocedure Evaluation (Signed)
Anesthesia Post Note  Patient: Jacqueline Winters  Procedure(s) Performed: EXCISION HIDRADENITIS AXILLA WITH COMPLEX CLOSURE (Right: Axilla)     Patient location during evaluation: PACU Anesthesia Type: General Level of consciousness: sedated and patient cooperative Pain management: pain level controlled Vital Signs Assessment: post-procedure vital signs reviewed and stable Respiratory status: spontaneous breathing Cardiovascular status: stable Anesthetic complications: no Comments: Multifocal atrial pacemaker noted on monitor in PACU. Rate normal. EKG confirmed. Discussed with patient and recommended  return to cardiologist if any issues arise.    No notable events documented.  Last Vitals:  Vitals:   09/14/21 1330 09/14/21 1349  BP: 136/74 (!) 124/92  Pulse: 60 (!) 58  Resp: 16   Temp:  36.4 C  SpO2: 97% 96%    Last Pain:  Vitals:   09/14/21 1349  TempSrc: Oral  PainSc:                  Nolon Nations

## 2021-09-14 NOTE — Transfer of Care (Signed)
Immediate Anesthesia Transfer of Care Note  Patient: Jacqueline Winters  Procedure(s) Performed: EXCISION HIDRADENITIS AXILLA WITH COMPLEX CLOSURE (Right: Axilla)  Patient Location: PACU  Anesthesia Type:General  Level of Consciousness: awake, alert  and patient cooperative  Airway & Oxygen Therapy: Patient Spontanous Breathing and Patient connected to face mask oxygen  Post-op Assessment: Report given to RN, Post -op Vital signs reviewed and stable, Patient moving all extremities X 4 and Patient able to stick tongue midline  Post vital signs: Reviewed and stable  Last Vitals:  Vitals Value Taken Time  BP 128/95 09/14/21 1228  Temp    Pulse 92 09/14/21 1230  Resp 16 09/14/21 1230  SpO2 94 % 09/14/21 1230  Vitals shown include unvalidated device data.  Last Pain:  Vitals:   09/14/21 1013  TempSrc: Oral  PainSc: 0-No pain         Complications: No notable events documented.

## 2021-09-15 ENCOUNTER — Encounter (HOSPITAL_BASED_OUTPATIENT_CLINIC_OR_DEPARTMENT_OTHER): Payer: Self-pay | Admitting: Plastic Surgery

## 2021-09-15 LAB — SURGICAL PATHOLOGY

## 2021-09-22 ENCOUNTER — Ambulatory Visit (INDEPENDENT_AMBULATORY_CARE_PROVIDER_SITE_OTHER): Payer: 59 | Admitting: Plastic Surgery

## 2021-09-22 DIAGNOSIS — L732 Hidradenitis suppurativa: Secondary | ICD-10-CM

## 2021-09-22 NOTE — Progress Notes (Signed)
Patient presents postop from excision of right axillary hidradenitis.  She feels like things are going well.  On exam everything looks to be healing well with no signs of healing complication.  Pathology was reviewed and was benign.  We will plan to see her again on an as-needed basis.  All of her questions were answered.

## 2021-09-23 ENCOUNTER — Encounter: Payer: Self-pay | Admitting: Plastic Surgery

## 2021-09-27 ENCOUNTER — Ambulatory Visit (INDEPENDENT_AMBULATORY_CARE_PROVIDER_SITE_OTHER): Payer: 59 | Admitting: Student

## 2021-09-27 ENCOUNTER — Encounter: Payer: Self-pay | Admitting: Plastic Surgery

## 2021-09-27 DIAGNOSIS — L732 Hidradenitis suppurativa: Secondary | ICD-10-CM

## 2021-09-27 NOTE — Progress Notes (Signed)
Patient is a 41 year old female with history of right axillary hidradenitis. She underwent excision of right axillary hidradenitis and complex closure of the right axilla with Dr. Claudia Desanctis on 09/14/21.  Patient presents to the clinic for follow-up.  Patient reached out to the clinic this morning with concerns regarding her incision site and pain.  Patient was last seen in the clinic on 09/22/2021.  At this visit, patient was doing well.  On exam, everything was healing well and there were no signs of complication.  Patient was to follow-up on an as-needed basis.  Today, patient reports noticed a small wound develop in the middle of her incision. She denies any fevers or chills. She denies any drainage from the site. She reports a little bit of redness at the wound.   On exam, patient is sitting upright in no acute distress. There is a small wound to the middle of her incision to her right axilla. There are no cellulitic changes to the skin. There is no drainage, malodor or swelling. The incision is otherwise intact.   Discussed with patient that she may put a small amount of vaseline to the area and cover with dry gauze daily. Discussed with patient that she should avoid strenuous activities, lifting above 10-15 lbs and overhead motions.   Discussed with patient to call us if the area becomes red, swollen or starts draining pus. Patient acknowledged.  Instructed patient to call if she has any questions or concerns.   Pictures were obtained and placed in the patient's chart with the patient's permission.   Patient to follow up in the clinic next week.

## 2021-10-07 ENCOUNTER — Ambulatory Visit: Payer: 59 | Admitting: Plastic Surgery

## 2021-10-08 ENCOUNTER — Ambulatory Visit: Payer: 59 | Admitting: Medical

## 2021-10-18 ENCOUNTER — Encounter: Payer: Self-pay | Admitting: Medical

## 2021-10-18 ENCOUNTER — Ambulatory Visit: Payer: 59 | Admitting: Medical

## 2021-10-18 VITALS — BP 130/80 | HR 62 | Wt 215.6 lb

## 2021-10-18 DIAGNOSIS — Z9889 Other specified postprocedural states: Secondary | ICD-10-CM | POA: Diagnosis not present

## 2021-10-18 DIAGNOSIS — E611 Iron deficiency: Secondary | ICD-10-CM | POA: Diagnosis not present

## 2021-10-18 DIAGNOSIS — L905 Scar conditions and fibrosis of skin: Secondary | ICD-10-CM | POA: Diagnosis not present

## 2021-10-18 DIAGNOSIS — R109 Unspecified abdominal pain: Secondary | ICD-10-CM | POA: Insufficient documentation

## 2021-10-18 DIAGNOSIS — R140 Abdominal distension (gaseous): Secondary | ICD-10-CM | POA: Diagnosis not present

## 2021-10-18 DIAGNOSIS — R52 Pain, unspecified: Secondary | ICD-10-CM | POA: Insufficient documentation

## 2021-10-18 NOTE — Progress Notes (Signed)
Subjective:  Jacqueline Winters is a 41 y.o. female who presents for Chief Complaint  Patient presents with   Acute Visit    Digestive issues. Wants referral to GI.     Here for abdominal pain, LLQ.  Been having some pains of late.   Has had this kind of pain in the past. Bowels have been sluggish.   Pain starts LLQ and radiates to pubic/pelvic region.   Improves avoiding certain foods or fasting.   Sometimes is a sharp pain.   Dairy, broccoli, cabbage, can all make things worse.  Can sometimes be worse with periods, but has seen gyn in the past related to pelvic pain, bleeding.     She does note history of plastic surgery along her lower abdomen throughout from prior excess skin after weight loss.  She has prior C-section scar as well.  She is tender in the left lower region where the scar is located.  Sometimes it seems more full and that left lower abdomen than the right.  No recent blood in stool or blood in urine.  She has a relatively recent history of iron deficiency.  She is taking iron regularly.  No other aggravating or relieving factors.    No other c/o.  Past Medical History:  Diagnosis Date   Allergy    GERD (gastroesophageal reflux disease)    with pregnancy   Goiter    Headache(784.0)    Heart murmur    Hidradenitis axillaris    Impaired fasting blood sugar    Insomnia    Obesity    PONV (postoperative nausea and vomiting)    Thyroid nodule    biopsy 07/2013, consult with Jacqueline Winters endocrinology   Current Outpatient Medications on File Prior to Visit  Medication Sig Dispense Refill   acetaminophen (TYLENOL) 500 MG tablet Take 500 mg by mouth every 6 (six) hours as needed.     cholecalciferol (VITAMIN D3) 25 MCG (1000 UNIT) tablet Take by mouth daily.     ibuprofen (ADVIL) 600 MG tablet Take 1 tablet (600 mg total) by mouth every 6 (six) hours as needed. 30 tablet 0   iron polysaccharides (NIFEREX) 150 MG capsule Take 1 capsule (150 mg total) by mouth daily.  90 capsule 0   levocetirizine (XYZAL) 5 MG tablet Take 5 mg by mouth every evening.     Magnesium 200 MG TABS Take 250 mg by mouth daily.     Multiple Vitamin (MULTIVITAMIN) tablet Take 1 tablet by mouth daily.     Multiple Vitamins-Minerals (ZINC PO) Take by mouth daily in the afternoon.     VITAMIN E PO Take 1 capsule by mouth daily.     fluconazole (DIFLUCAN) 100 MG tablet Take 1 tablet (100 mg total) by mouth daily. (Patient not taking: Reported on 10/18/2021) 2 tablet 0   ondansetron (ZOFRAN) 4 MG tablet Take 1 tablet (4 mg total) by mouth every 8 (eight) hours as needed for nausea or vomiting. (Patient not taking: Reported on 10/18/2021) 20 tablet 0   No current facility-administered medications on file prior to visit.   Past Surgical History:  Procedure Laterality Date   CESAREAN SECTION  12/21/2011   Procedure: CESAREAN SECTION;  Surgeon: Jacqueline Gobble. Harrington Challenger, MD;  Location: [facility];  Service: Obstetrics;  Laterality: N/A;  Bilateral Tubal Ligation   CHOLECYSTECTOMY  2009   HYDRADENITIS EXCISION Right 09/14/2021   Procedure: EXCISION HIDRADENITIS AXILLA WITH COMPLEX CLOSURE;  Surgeon: Jacqueline Presume, MD;  Location: [facility];  Service: Plastics;  Laterality: Right;  45 mins   INCISION AND DRAINAGE     right axillary boils   PILONIDAL CYST / SINUS EXCISION  2008   [facility]   TUBAL LIGATION     VAGINAL DELIVERY  2002, 1998     The following portions of the patient's history were reviewed and updated as appropriate: allergies, current medications, past family history, past medical history, past social history, past surgical history and problem list.  ROS Otherwise as in subjective above  Objective: BP 130/80   Pulse 62   Wt 215 lb 9.6 oz (97.8 kg)   SpO2 99%   BMI 37.01 kg/m   General appearance: alert, no distress, well developed, well nourished Abdomen: +bs, soft, non tender, non distended, no masses, no hepatomegaly, no splenomegaly, no obvious  hernia Pulses: 2+ radial pulses, 2+ pedal pulses, normal cap refill Ext: no edema Skin: There is a long scar going from the left to right abdomen on the perimeter of the abdomen hidden by the pants or underwear line.  This was from prior plastic surgery and prior C-section scar in the lower transverse pelvis region.  She is tender within the scar tissue superficial skin left lower. Exam chaperoned by nurse    Assessment: Encounter Diagnoses  Name Primary?   Abdominal discomfort Yes   Scar painful    S/P plastic surgery    Bloating    Iron deficiency      Plan: We discussed her symptoms and exam findings.  She may be having some bloating in gas related to diet including gas producing foods such as cabbage and onions.  However her tenderness seems to be more superficial in the skin along the scar left lower abdomen from prior plastic surgery and prior C-section.  She does have plans to follow-up with plastic surgery in the near future since she just had recent surgery for hidradenitis.  They could consider steroid injection into the scar tissue or other therapy.  Avoid gas producing foods and heavy portions for now.  We discussed other causes of left lower quadrant abdominal pain but no obvious sign of diverticulitis or hernia.  She had a CT scan of the abdomen pelvis in 2020 that did not show hernia or diverticulitis at that time.  Referral to gastroenterology given iron deficiency and abdominal pain in general, rule out sources of blood loss in the colon.  Continue iron supplement for now  Jacqueline Winters was seen today for acute visit.  Diagnoses and all orders for this visit:  Abdominal discomfort -     Ambulatory referral to Gastroenterology  Scar painful  S/P plastic surgery  Bloating -     Ambulatory referral to Gastroenterology  Iron deficiency -     Ambulatory referral to Gastroenterology    Follow up: pending GI consult

## 2021-10-25 ENCOUNTER — Other Ambulatory Visit (HOSPITAL_COMMUNITY): Payer: Self-pay

## 2021-10-25 DIAGNOSIS — R1032 Left lower quadrant pain: Secondary | ICD-10-CM | POA: Diagnosis not present

## 2021-10-25 DIAGNOSIS — I498 Other specified cardiac arrhythmias: Secondary | ICD-10-CM | POA: Diagnosis not present

## 2021-10-25 DIAGNOSIS — K219 Gastro-esophageal reflux disease without esophagitis: Secondary | ICD-10-CM | POA: Diagnosis not present

## 2021-10-25 MED ORDER — METHYLPREDNISOLONE 4 MG PO TBPK
ORAL_TABLET | ORAL | 1 refills | Status: DC
  Filled 2021-10-25: qty 21, 6d supply, fill #0
  Filled 2021-11-04: qty 21, 6d supply, fill #1

## 2021-10-26 ENCOUNTER — Other Ambulatory Visit (HOSPITAL_COMMUNITY): Payer: Self-pay

## 2021-10-26 MED ORDER — NA SULFATE-K SULFATE-MG SULF 17.5-3.13-1.6 GM/177ML PO SOLN
ORAL | 0 refills | Status: DC
  Filled 2021-10-26: qty 354, 1d supply, fill #0

## 2021-11-01 ENCOUNTER — Other Ambulatory Visit (HOSPITAL_COMMUNITY): Payer: Self-pay

## 2021-11-03 ENCOUNTER — Other Ambulatory Visit: Payer: Self-pay | Admitting: Medical

## 2021-11-04 ENCOUNTER — Other Ambulatory Visit (HOSPITAL_COMMUNITY): Payer: Self-pay

## 2021-11-04 DIAGNOSIS — R1032 Left lower quadrant pain: Secondary | ICD-10-CM | POA: Diagnosis not present

## 2021-11-04 DIAGNOSIS — D123 Benign neoplasm of transverse colon: Secondary | ICD-10-CM | POA: Diagnosis not present

## 2021-11-04 DIAGNOSIS — K635 Polyp of colon: Secondary | ICD-10-CM | POA: Diagnosis not present

## 2021-11-04 LAB — HM COLONOSCOPY

## 2021-11-05 DIAGNOSIS — Z1231 Encounter for screening mammogram for malignant neoplasm of breast: Secondary | ICD-10-CM | POA: Diagnosis not present

## 2021-11-05 LAB — HM MAMMOGRAPHY

## 2021-11-09 ENCOUNTER — Encounter: Payer: Self-pay | Admitting: Internal Medicine

## 2021-11-23 ENCOUNTER — Other Ambulatory Visit (HOSPITAL_COMMUNITY): Payer: Self-pay

## 2021-11-23 DIAGNOSIS — L298 Other pruritus: Secondary | ICD-10-CM | POA: Diagnosis not present

## 2021-11-23 DIAGNOSIS — L728 Other follicular cysts of the skin and subcutaneous tissue: Secondary | ICD-10-CM | POA: Diagnosis not present

## 2021-11-23 MED ORDER — FLUTICASONE PROPIONATE 0.05 % EX CREA
1.0000 | TOPICAL_CREAM | Freq: Every day | CUTANEOUS | 1 refills | Status: DC | PRN
  Filled 2021-11-23: qty 60, 30d supply, fill #0

## 2021-12-15 ENCOUNTER — Encounter: Payer: Self-pay | Admitting: Internal Medicine

## 2022-01-05 ENCOUNTER — Encounter: Payer: Self-pay | Admitting: Family Medicine

## 2022-01-05 ENCOUNTER — Telehealth: Payer: 59 | Admitting: Family Medicine

## 2022-01-05 VITALS — Temp 100.7°F | Wt 215.0 lb

## 2022-01-05 DIAGNOSIS — J029 Acute pharyngitis, unspecified: Secondary | ICD-10-CM

## 2022-01-05 DIAGNOSIS — R519 Headache, unspecified: Secondary | ICD-10-CM

## 2022-01-05 NOTE — Progress Notes (Signed)
   Subjective:    Patient ID: Jacqueline Winters, female    DOB: [birth year], 41 y.o.   MRN: [id number]  HPI Documentation for virtual audio and video telecommunications through [facility] encounter: The patient was located at home. 2 patient identifiers used.  The provider was located in the office. The patient did consent to this visit and is aware of possible charges through their insurance for this visit. The other persons participating in this telemedicine service were none. Time spent on call was 5 minutes and in review of previous records >15 minutes total for counseling and coordination of care. This virtual service is not related to other E/M service within previous 7 days.  She complains of a 1 day history of headache, nasal congestion, sore throat with myalgias and temperature of 100.7.  She tested herself and was COVID-negative.  She is not coughing.  Review of Systems     Objective:   Physical Exam Alert and in no distress but slightly toxic appearing       Assessment & Plan:  Sore throat  Nonintractable headache, unspecified chronicity pattern, unspecified headache type I explained that at this time it is definitely a viral type of problem and to use Tylenol as well as Aleve for pain relief.  Recommend she check her COVID test tomorrow to make sure it is negative and if so she can always come in here for further evaluation for strep screen.  She was comfortable with that.

## 2022-01-11 ENCOUNTER — Other Ambulatory Visit: Payer: Self-pay | Admitting: Medical

## 2022-01-11 ENCOUNTER — Other Ambulatory Visit (HOSPITAL_COMMUNITY): Payer: Self-pay

## 2022-01-11 NOTE — Telephone Encounter (Signed)
Sent message to patient by mychart on refills diflucan and doxcycline

## 2022-01-12 ENCOUNTER — Telehealth: Payer: Self-pay | Admitting: Medical

## 2022-01-12 ENCOUNTER — Other Ambulatory Visit: Payer: Self-pay | Admitting: Medical

## 2022-01-12 ENCOUNTER — Other Ambulatory Visit: Payer: Self-pay | Admitting: Internal Medicine

## 2022-01-12 ENCOUNTER — Encounter: Payer: Self-pay | Admitting: Internal Medicine

## 2022-01-12 ENCOUNTER — Other Ambulatory Visit (HOSPITAL_COMMUNITY): Payer: Self-pay

## 2022-01-12 MED ORDER — DOXYCYCLINE HYCLATE 100 MG PO TABS: 100.0000 mg | ORAL_TABLET | Freq: Two times a day (BID) | ORAL | 0 refills | Status: DC

## 2022-01-12 MED ORDER — FLUCONAZOLE 100 MG PO TABS
100.0000 mg | ORAL_TABLET | Freq: Every day | ORAL | 0 refills | Status: DC
  Filled 2022-01-12: qty 2, 2d supply, fill #0

## 2022-01-12 MED ORDER — FLUCONAZOLE 150 MG PO TABS
150.0000 mg | ORAL_TABLET | ORAL | 0 refills | Status: DC
  Filled 2022-01-12 – 2022-01-14 (×2): qty 2, 14d supply, fill #0

## 2022-01-12 MED ORDER — DOXYCYCLINE HYCLATE 100 MG PO TABS
100.0000 mg | ORAL_TABLET | Freq: Two times a day (BID) | ORAL | 0 refills | Status: DC
  Filled 2022-01-12 – 2022-01-14 (×2): qty 20, 10d supply, fill #0

## 2022-01-12 MED ORDER — FLUCONAZOLE 150 MG PO TABS: 150.0000 mg | ORAL_TABLET | ORAL | 0 refills | Status: DC

## 2022-01-12 NOTE — Telephone Encounter (Signed)
Pt informed,

## 2022-01-12 NOTE — Telephone Encounter (Signed)
Pt called she has another boil under L arm this time. She was wanting to see if you would refill the  Doxycycline and Diflucan for that.  She has scheduled an appointment (virtual) for tomorrow if you need to  See her first.  She did go to derm and then plastic surgeon and had surgery also on R side  So, do you need to see her and if so is it ok for virtual?

## 2022-01-12 NOTE — Addendum Note (Signed)
Addended by: Minette Headland A on: 01/12/2022 09:21 AM   Modules accepted: Orders

## 2022-01-12 NOTE — Telephone Encounter (Signed)
Pt called back and needs Doxycycline and Diflucan resent to the [place] pharmacy  She does not use the [facility] pharmacy

## 2022-01-13 ENCOUNTER — Telehealth: Payer: 59 | Admitting: Medical

## 2022-01-14 ENCOUNTER — Other Ambulatory Visit (HOSPITAL_COMMUNITY): Payer: Self-pay

## 2022-03-15 ENCOUNTER — Encounter: Payer: Self-pay | Admitting: *Deleted

## 2022-03-15 ENCOUNTER — Telehealth: Payer: 59 | Admitting: Medical

## 2022-03-15 ENCOUNTER — Other Ambulatory Visit (INDEPENDENT_AMBULATORY_CARE_PROVIDER_SITE_OTHER): Payer: 59

## 2022-03-15 ENCOUNTER — Other Ambulatory Visit (HOSPITAL_COMMUNITY): Payer: Self-pay

## 2022-03-15 ENCOUNTER — Encounter: Payer: Self-pay | Admitting: Medical

## 2022-03-15 VITALS — Temp 100.4°F | Ht 64.0 in | Wt 217.0 lb

## 2022-03-15 DIAGNOSIS — R509 Fever, unspecified: Secondary | ICD-10-CM

## 2022-03-15 DIAGNOSIS — R6889 Other general symptoms and signs: Secondary | ICD-10-CM | POA: Diagnosis not present

## 2022-03-15 DIAGNOSIS — Z20828 Contact with and (suspected) exposure to other viral communicable diseases: Secondary | ICD-10-CM | POA: Diagnosis not present

## 2022-03-15 LAB — POC COVID19 BINAXNOW: SARS Coronavirus 2 Ag: NEGATIVE

## 2022-03-15 LAB — POCT INFLUENZA A/B
Influenza A, POC: NEGATIVE
Influenza B, POC: NEGATIVE

## 2022-03-15 MED ORDER — OSELTAMIVIR PHOSPHATE 75 MG PO CAPS
75.0000 mg | ORAL_CAPSULE | Freq: Two times a day (BID) | ORAL | 0 refills | Status: DC
  Filled 2022-03-15: qty 10, 5d supply, fill #0

## 2022-03-15 NOTE — Progress Notes (Signed)
Subjective:     Patient ID: Jacqueline Winters, female   DOB: 1981-02-09, 41 y.o.   MRN: [id number]  This visit type was conducted due to national recommendations for restrictions regarding the COVID-19 Pandemic (e.g. social distancing) in an effort to limit this patient's exposure and mitigate transmission in our community.  Due to their co-morbid illnesses, this patient is at least at moderate risk for complications without adequate follow up.  This format is felt to be most appropriate for this patient at this time.    Documentation for virtual audio and video telecommunications through [place] encounter:  The patient was located at home. The provider was located in the office. The patient did consent to this visit and is aware of possible charges through their insurance for this visit.  The other persons participating in this telemedicine service were none. Time spent on call was 20 minutes and in review of previous records 20 minutes total.  This virtual service is not related to other E/M service within previous 7 days.   HPI Chief Complaint  Patient presents with   Generalized Body Aches    VIRTUAL body aches, fever and PND. Symptoms started Sunday. Last night started having pain in breast area that radiated around to the right/back area. Exposed to flu last Thursday.    Virtual for concerns.  She notes 3 days of fever, body aches, nausea, post nasal drainage.  Felt fatigue.   Temp around 99 - 100.4.  some chills.  Some sneezing, a few coughs.   Mainly drainage in back of throat.  Exposed to flu.  Working 2 jobs currently, so was already fatigued.   A coworker exposed her to flu this past week.  Did covid test few days ago, negative.  Missed work today and yesterday for illness.  No other aggravating or relieving factors. No other complaint.   Past Medical History:  Diagnosis Date   Allergy    GERD (gastroesophageal reflux disease)    with pregnancy   Goiter     Headache(784.0)    Heart murmur    Hidradenitis axillaris    Impaired fasting blood sugar    Insomnia    Obesity    PONV (postoperative nausea and vomiting)    Thyroid nodule    biopsy 07/2013, consult with Dr. Iran Planas endocrinology   Current Outpatient Medications on File Prior to Visit  Medication Sig Dispense Refill   Cetirizine HCl (ZYRTEC ALLERGY PO) Take by mouth.     cholecalciferol (VITAMIN D3) 25 MCG (1000 UNIT) tablet Take by mouth daily.     FOLIC ACID PO Take by mouth.     iron polysaccharides (NIFEREX) 150 MG capsule TAKE 1 CAPSULE BY MOUTH EVERY DAY 90 capsule 0   Magnesium 200 MG TABS Take 250 mg by mouth daily.     Multiple Vitamin (MULTIVITAMIN) tablet Take 1 tablet by mouth daily.     Multiple Vitamins-Minerals (ZINC PO) Take by mouth daily in the afternoon.     naproxen sodium (ALEVE) 220 MG tablet Take 440 mg by mouth.     Nutritional Supplements (GLUCOSAMINE COMPLEX PO) Take 3 tablets by mouth daily.     VITAMIN E PO Take 1 capsule by mouth daily.     acetaminophen (TYLENOL) 500 MG tablet Take 500 mg by mouth every 6 (six) hours as needed. (Patient not taking: Reported on 01/05/2022)     fluconazole (DIFLUCAN) 150 MG tablet Take 1 tablet (150 mg total) by mouth once a week. (  Patient not taking: Reported on 03/15/2022) 2 tablet 0   fluticasone (CUTIVATE) 0.05 % cream Apply 1 Application topically daily as needed for itch. (Patient not taking: Reported on 03/15/2022) 60 g 1   ibuprofen (ADVIL) 600 MG tablet Take 1 tablet (600 mg total) by mouth every 6 (six) hours as needed. (Patient not taking: Reported on 01/05/2022) 30 tablet 0   No current facility-administered medications on file prior to visit.    Review of Systems As in subjective    Objective:   Physical Exam Due to coronavirus pandemic stay at home measures, patient visit was virtual and they were not examined in person.   Temp (!) 100.4 F (38 C) (Temporal)   Ht '5\' 4"'$  (1.626 m)   Wt 217 lb (98.4  kg)   LMP 02/18/2022   BMI 37.25 kg/m   Gen: wd, wn, nad Somewhat ill appearing Congested sounding, no labored breathing or wheezing     Assessment:     Encounter Diagnoses  Name Primary?   Flu-like symptoms Yes   Exposure to influenza    Fever, unspecified fever cause        Plan:     We discussed symptoms and concerns.  Very likely influenza.  Advised rest, hydration, consider Zicam over-the-counter, Robitussin-DM if worse cough and congestion, advised salt water gargles and nasal saline.  If much worse next 2 days call or recheck.  Begin Tamiflu.  Discussed risk and benefits of medication.  Work note given   Jacqueline Winters was seen today for generalized body aches.  Diagnoses and all orders for this visit:  Flu-like symptoms  Exposure to influenza  Fever, unspecified fever cause  Other orders -     oseltamivir (TAMIFLU) 75 MG capsule; Take 1 capsule (75 mg total) by mouth 2 (two) times daily.  F/u prn

## 2022-03-15 NOTE — Patient Instructions (Signed)
Your swabs were negative for flu /covid but given your flu like symptoms and flu exposure, this still is suggestive of flu like illness  Recommendations: Rest Drink plenty of clear fluids over the next few days You can alternate Tylenol and ibuprofen for aches and pains and fevers not feeling well Begin Tamiflu twice daily for 5 days You can also use over-the-counter Zicam or emergenC vitamin pack If needed you can use over the counter medication for cough and congestion such as robitussin DM or Norel DM for example If much worse over the next few days particular difficulty breathing or uncontrollable vomiting get reevaluated

## 2022-03-18 ENCOUNTER — Ambulatory Visit
Admission: RE | Admit: 2022-03-18 | Discharge: 2022-03-18 | Disposition: A | Discharge status: Home / Self Care | Payer: 59 | Source: Ambulatory Visit | Attending: Medical | Admitting: Medical

## 2022-03-18 ENCOUNTER — Other Ambulatory Visit: Payer: Self-pay | Admitting: Medical

## 2022-03-18 DIAGNOSIS — R0602 Shortness of breath: Secondary | ICD-10-CM | POA: Diagnosis not present

## 2022-03-18 DIAGNOSIS — R051 Acute cough: Secondary | ICD-10-CM

## 2022-03-18 DIAGNOSIS — R059 Cough, unspecified: Secondary | ICD-10-CM | POA: Diagnosis not present

## 2022-03-21 NOTE — Progress Notes (Signed)
Results sent through MyChart

## 2022-04-06 ENCOUNTER — Encounter: Payer: Self-pay | Admitting: Medical

## 2022-04-06 ENCOUNTER — Ambulatory Visit (INDEPENDENT_AMBULATORY_CARE_PROVIDER_SITE_OTHER): Payer: 59 | Admitting: Medical

## 2022-04-06 VITALS — BP 124/86 | HR 91 | Resp 16 | Ht 63.0 in | Wt 225.4 lb

## 2022-04-06 DIAGNOSIS — U099 Post covid-19 condition, unspecified: Secondary | ICD-10-CM | POA: Diagnosis not present

## 2022-04-06 DIAGNOSIS — E049 Nontoxic goiter, unspecified: Secondary | ICD-10-CM | POA: Diagnosis not present

## 2022-04-06 DIAGNOSIS — R7301 Impaired fasting glucose: Secondary | ICD-10-CM

## 2022-04-06 DIAGNOSIS — Z0000 Encounter for general adult medical examination without abnormal findings: Secondary | ICD-10-CM | POA: Diagnosis not present

## 2022-04-06 DIAGNOSIS — G4700 Insomnia, unspecified: Secondary | ICD-10-CM

## 2022-04-06 DIAGNOSIS — R5382 Chronic fatigue, unspecified: Secondary | ICD-10-CM | POA: Diagnosis not present

## 2022-04-06 DIAGNOSIS — Z13220 Encounter for screening for lipoid disorders: Secondary | ICD-10-CM | POA: Diagnosis not present

## 2022-04-06 DIAGNOSIS — E611 Iron deficiency: Secondary | ICD-10-CM

## 2022-04-06 DIAGNOSIS — J301 Allergic rhinitis due to pollen: Secondary | ICD-10-CM

## 2022-04-06 DIAGNOSIS — E559 Vitamin D deficiency, unspecified: Secondary | ICD-10-CM | POA: Diagnosis not present

## 2022-04-06 DIAGNOSIS — R8290 Unspecified abnormal findings in urine: Secondary | ICD-10-CM | POA: Diagnosis not present

## 2022-04-06 DIAGNOSIS — M2550 Pain in unspecified joint: Secondary | ICD-10-CM

## 2022-04-06 DIAGNOSIS — Z23 Encounter for immunization: Secondary | ICD-10-CM

## 2022-04-06 LAB — POCT URINALYSIS DIP (CLINITEK)
Bilirubin, UA: NEGATIVE
Blood, UA: NEGATIVE
Glucose, UA: NEGATIVE mg/dL
Ketones, POC UA: NEGATIVE mg/dL
Nitrite, UA: NEGATIVE
POC PROTEIN,UA: NEGATIVE
Spec Grav, UA: 1.015 (ref 1.010–1.025)
Urobilinogen, UA: 0.2 E.U./dL
pH, UA: 7.5 (ref 5.0–8.0)

## 2022-04-06 NOTE — Addendum Note (Signed)
Addended byPauline Good, Kristle Wesch on: 04/06/2022 02:51 PM   Modules accepted: Orders

## 2022-04-06 NOTE — Progress Notes (Addendum)
Subjective:   HPI  Jacqueline Winters is a 41 y.o. female who presents for Chief Complaint  Patient presents with   Annual Exam    Annual exam  Pt states she is having a pain in her neck down into her shoulders  Fatigue ongoing since pt had COVID     Patient Care Team: Yuvaan Olander, Leward Quan as PCP - General (Family Medicine) Sees dentist: Dr. Marian Sorrow (Family dentistry) Sees eye doctor:Lens Cra[facility] OB[facility]Gyn Dr. Alric Ran, neurology Dr. Kate Sable, cardiology Dr. Carol Ada, GI Dr. Nehemiah Settle, endocrinology Dr. Cindie Crumbly, dermatology Plastic surgery 2022   Concerns: Feels ongoing fatigue for months, aches in upper back, neck, knees.  Doesn't feel well and hasn't felt well for a while.  Seeing a chiropractor currently.  Knees popping and grinding.  No joint swelling specifically though.  Bowels with chronic fatigue, low energy, achiness in her body   Reviewed their medical, surgical, family, social, medication, and allergy history and updated chart as appropriate.  Past Medical History:  Diagnosis Date   Allergy    GERD (gastroesophageal reflux disease)    with pregnancy   Goiter    Headache(784.0)    Heart murmur    Hidradenitis axillaris    Impaired fasting blood sugar    Insomnia    Obesity    PONV (postoperative nausea and vomiting)    Thyroid nodule    biopsy 07/2013, consult with Dr. Iran Planas endocrinology    Family History  Problem Relation Age of Onset   Hypertension Mother    Diabetes Mother    Hypertension Sister    Cancer Maternal Grandmother        colon, ovarian   Hypertension Sister    Arthritis Father    Cancer Maternal Aunt        breast   Heart disease Neg Hx    Stroke Neg Hx      Current Outpatient Medications:    acetaminophen (TYLENOL) 500 MG tablet, Take 500 mg by mouth every 6 (six) hours as needed., Disp: , Rfl:    Cetirizine HCl (ZYRTEC ALLERGY PO), Take by mouth., Disp: , Rfl:    cholecalciferol (VITAMIN  D3) 25 MCG (1000 UNIT) tablet, Take by mouth daily., Disp: , Rfl:    fluticasone (CUTIVATE) 0.05 % cream, Apply 1 Application topically daily as needed for itch., Disp: 60 g, Rfl: 1   FOLIC ACID PO, Take by mouth., Disp: , Rfl:    ibuprofen (ADVIL) 600 MG tablet, Take 1 tablet (600 mg total) by mouth every 6 (six) hours as needed., Disp: 30 tablet, Rfl: 0   iron polysaccharides (NIFEREX) 150 MG capsule, TAKE 1 CAPSULE BY MOUTH EVERY DAY, Disp: 90 capsule, Rfl: 0   Magnesium 200 MG TABS, Take 250 mg by mouth daily., Disp: , Rfl:    Multiple Vitamin (MULTIVITAMIN) tablet, Take 1 tablet by mouth daily., Disp: , Rfl:    Multiple Vitamins-Minerals (ZINC PO), Take by mouth daily in the afternoon., Disp: , Rfl:    Nutritional Supplements (GLUCOSAMINE COMPLEX PO), Take 3 tablets by mouth daily., Disp: , Rfl:    VITAMIN E PO, Take 1 capsule by mouth daily., Disp: , Rfl:    fluconazole (DIFLUCAN) 150 MG tablet, Take 1 tablet (150 mg total) by mouth once a week. (Patient not taking: Reported on 03/15/2022), Disp: 2 tablet, Rfl: 0   naproxen sodium (ALEVE) 220 MG tablet, Take 440 mg by mouth. (Patient not taking: Reported on 04/06/2022), Disp: ,  Rfl:    oseltamivir (TAMIFLU) 75 MG capsule, Take 1 capsule (75 mg total) by mouth 2 (two) times daily. (Patient not taking: Reported on 04/06/2022), Disp: 10 capsule, Rfl: 0  Allergies  Allergen Reactions   Hydrocodone-Acetaminophen Nausea Only   Vicodin [Hydrocodone-Acetaminophen]     uneasy    Review of Systems  Constitutional:  Positive for malaise/fatigue. Negative for chills, fever and weight loss.  HENT:  Negative for congestion, ear pain, hearing loss, sore throat and tinnitus.   Eyes:  Negative for blurred vision, pain and redness.  Respiratory:  Negative for cough, hemoptysis and shortness of breath.   Cardiovascular:  Negative for chest pain, palpitations, orthopnea, claudication and leg swelling.  Gastrointestinal:  Negative for abdominal pain,  blood in stool, constipation, diarrhea, nausea and vomiting.  Genitourinary:  Negative for dysuria, flank pain, frequency, hematuria and urgency.  Musculoskeletal:  Positive for back pain, joint pain, myalgias and neck pain. Negative for falls.  Skin:  Negative for itching and rash.  Neurological:  Negative for dizziness, tingling, speech change, weakness and headaches.  Endo/Heme/Allergies:  Negative for polydipsia. Does not bruise/bleed easily.  Psychiatric/Behavioral:  Negative for depression and memory loss. The patient is not nervous/anxious and does not have insomnia.         Objective:  BP 124/86   Pulse 91   Resp 16   Ht _0  (1.6 m)   Wt 225 lb 6 oz (102.2 kg)   LMP 02/18/2022   SpO2 98%   BMI 39.92 kg/m   Wt Readings from Last 3 Encounters:  04/06/22 225 lb 6 oz (102.2 kg)  03/15/22 217 lb (98.4 kg)  01/05/22 215 lb (97.5 kg)   General appearance: alert, no distress, WD/WN, African American female Skin: Scarring right axilla from prior hidradenitis issues, otherwise no worrisome lesions HEENT: normocephalic, conjunctiva/corneas normal, sclerae anicteric, PERRLA, EOMi, nares patent, no discharge or erythema, pharynx normal Neck: supple, no lymphadenopathy, generalized goiter without nodules, no masses, normal ROM, no bruits Chest: non tender, normal shape and expansion Heart: RRR, normal S1, S2, no murmurs Lungs: CTA bilaterally, no wheezes, rhonchi, or rales Abdomen: +bs, soft, non tender, non distended, no masses, no hepatomegaly, no splenomegaly, no bruits Back: non tender, normal ROM, no scoliosis Musculoskeletal: upper extremities non tender, no obvious deformity, normal ROM throughout, lower extremities non tender, no obvious deformity, normal ROM throughout Extremities: no edema, no cyanosis, no clubbing Pulses: 2+ symmetric, upper and lower extremities, normal cap refill Neurological: alert, oriented x 3, CN2-12 intact, strength normal upper extremities and  lower extremities, sensation normal throughout, DTRs 2+ throughout, no cerebellar signs, gait normal Psychiatric: normal affect, behavior normal, pleasant  Breast/gyn/rectal - deferred to gynecology     Assessment and Plan :   Encounter Diagnoses  Name Primary?   Encounter for health maintenance examination in adult Yes   Need for Tdap vaccination    Vitamin D deficiency    Screening for lipid disorders    Iron deficiency    Insomnia, unspecified type    COVID-19 long hauler    Impaired fasting blood sugar    Thyroid goiter    Allergic rhinitis due to pollen, unspecified seasonality    Polyarthralgia    Chronic fatigue    Abnormal urinalysis      Physical exam - discussed and counseled on healthy lifestyle, diet, exercise, preventative care, vaccinations, sick and well care, proper use of emergency dept and after hours care, and addressed their concerns.  Health screening: Advised they see their eye doctor yearly for routine vision care. Advised they see their dentist yearly for routine dental care including hygiene visits twice yearly. See your gynecologist yearly for routine gynecological care.   Cancer screening Counseled on self breast exams, mammograms, cervical cancer screening.  Advised mammogram reviewed 2023 mammogram, 2023 colonoscopy reports  F/u with gynecology for routine care and cancer screen   Vaccinations: Immunization History  Administered Date(s) Administered   DTaP 06/29/1981, 12/03/1981, 09/03/1982, 09/26/1983, 12/04/1986   Hepatitis B 01/12/1994, 02/16/1994, 07/27/1994   Hepatitis B, PED/ADOLESCENT 01/12/1994, 02/16/1994, 07/27/1994   IPV 06/29/1981, 12/03/1981, 09/03/1982, 09/26/1983, 12/03/1986   Influenza Split 12/23/2011   Influenza,inj,Quad PF,6+ Mos 02/03/2020   Influenza-Unspecified 01/09/2014, 01/12/2015, 02/06/2022   MMR [birth year]1982   PFIZER Comirnaty(Gray Top)Covid-19 Tri-Sucrose Vaccine 05/02/2019, 05/24/2019, 04/07/2020    PFIZER(Purple Top)SARS-COV-2 Vaccination 05/02/2019, 05/24/2019, 04/07/2020   PPD Test 07/07/2015   Pneumococcal Polysaccharide-23 12/23/2011   Tdap 12/22/2011, 04/06/2022   Counseled on the Tdap (tetanus, diptheria, and acellular pertussis) vaccine.  Vaccine information sheet given. Tdap vaccine given after consent obtained.   Separate significant issues discussed: Additional lab testing today for autoimmune panel given ongoing fatigue, joint pains, pain in her body.  Consider sleep study to eval for sleep apnea.  Thyroid goiter-updated thyroid labs today  Vitamin D deficiency-updated labs today  History of COVID long-haul symptoms-consider follow-up with neurology  Impaired glucose-recheck labs for screening for diabetes   Hinda was seen today for annual exam.  Diagnoses and all orders for this visit:  Encounter for health maintenance examination in adult -     ANA -     Comprehensive metabolic panel -     CBC with Differential/Platelet -     Hemoglobin A1c -     VITAMIN D 25 Hydroxy (Vit-D Deficiency, Fractures) -     ANA Comprehensive Panel -     TSH + free T4 -     POCT URINALYSIS DIP (CLINITEK) -     Urine Culture  Need for Tdap vaccination -     Tdap vaccine greater than or equal to 7yo IM  Vitamin D deficiency -     VITAMIN D 25 Hydroxy (Vit-D Deficiency, Fractures)  Screening for lipid disorders  Iron deficiency -     CBC with Differential/Platelet  Insomnia, unspecified type  COVID-19 long hauler  Impaired fasting blood sugar -     Hemoglobin A1c  Thyroid goiter -     TSH + free T4  Allergic rhinitis due to pollen, unspecified seasonality  Polyarthralgia -     ANA Comprehensive Panel  Chronic fatigue -     ANA Comprehensive Panel  Abnormal urinalysis -     Urine Culture    Follow-up pending labs, yearly for physical

## 2022-04-06 NOTE — Addendum Note (Signed)
Addended by: Carlena Hurl on: 04/06/2022 02:54 PM   Modules accepted: Orders

## 2022-04-07 LAB — URINE CULTURE

## 2022-04-08 LAB — CBC WITH DIFFERENTIAL/PLATELET
Basophils Absolute: 0 10*3/uL (ref 0.0–0.2)
Basos: 0 %
EOS (ABSOLUTE): 0.1 10*3/uL (ref 0.0–0.4)
Eos: 1 %
Hematocrit: 36.3 % (ref 34.0–46.6)
Hemoglobin: 11.4 g/dL (ref 11.1–15.9)
Immature Grans (Abs): 0 10*3/uL (ref 0.0–0.1)
Immature Granulocytes: 0 %
Lymphocytes Absolute: 2.2 10*3/uL (ref 0.7–3.1)
Lymphs: 24 %
MCH: 27.3 pg (ref 26.6–33.0)
MCHC: 31.4 g/dL — ABNORMAL LOW (ref 31.5–35.7)
MCV: 87 fL (ref 79–97)
Monocytes Absolute: 0.7 10*3/uL (ref 0.1–0.9)
Monocytes: 8 %
Neutrophils Absolute: 6 10*3/uL (ref 1.4–7.0)
Neutrophils: 67 %
Platelets: 381 10*3/uL (ref 150–450)
RBC: 4.18 x10E6/uL (ref 3.77–5.28)
RDW: 14.4 % (ref 11.7–15.4)
WBC: 9 10*3/uL (ref 3.4–10.8)

## 2022-04-08 LAB — COMPREHENSIVE METABOLIC PANEL
ALT: 9 IU/L (ref 0–32)
AST: 13 IU/L (ref 0–40)
Albumin/Globulin Ratio: 1.4 (ref 1.2–2.2)
Albumin: 4.1 g/dL (ref 3.9–4.9)
Alkaline Phosphatase: 60 IU/L (ref 44–121)
BUN/Creatinine Ratio: 18 (ref 9–23)
BUN: 14 mg/dL (ref 6–24)
Bilirubin Total: <0.2 mg/dL (ref 0.0–1.2)
CO2: 22 mmol/L (ref 20–29)
Calcium: 9.2 mg/dL (ref 8.7–10.2)
Chloride: 102 mmol/L (ref 96–106)
Creatinine, Ser: 0.78 mg/dL (ref 0.57–1.00)
Globulin, Total: 2.9 g/dL (ref 1.5–4.5)
Glucose: 84 mg/dL (ref 70–99)
Potassium: 3.8 mmol/L (ref 3.5–5.2)
Sodium: 138 mmol/L (ref 134–144)
Total Protein: 7 g/dL (ref 6.0–8.5)
eGFR: 98 mL/min/{1.73_m2} (ref >59)

## 2022-04-08 LAB — TSH+FREE T4
Free T4: 1.01 ng/dL (ref 0.82–1.77)
TSH: 0.496 u[IU]/mL (ref 0.450–4.500)

## 2022-04-08 LAB — ANA COMPREHENSIVE PANEL
Anti JO-1: <0.2 AI (ref 0.0–0.9)
Centromere Ab Screen: <0.2 AI (ref 0.0–0.9)
Chromatin Ab SerPl-aCnc: <0.2 AI (ref 0.0–0.9)
ENA RNP Ab: 0.4 AI (ref 0.0–0.9)
ENA SM Ab Ser-aCnc: <0.2 AI (ref 0.0–0.9)
ENA SSA (RO) Ab: <0.2 AI (ref 0.0–0.9)
ENA SSB (LA) Ab: <0.2 AI (ref 0.0–0.9)
Scleroderma (Scl-70) (ENA) Antibody, IgG: <0.2 AI (ref 0.0–0.9)
dsDNA Ab: <1 IU/mL (ref 0–9)

## 2022-04-08 LAB — ANA: Anti Nuclear Antibody (ANA): NEGATIVE

## 2022-04-08 LAB — HEMOGLOBIN A1C
Est. average glucose Bld gHb Est-mCnc: 120 mg/dL
Hgb A1c MFr Bld: 5.8 % — ABNORMAL HIGH (ref 4.8–5.6)

## 2022-04-08 LAB — VITAMIN D 25 HYDROXY (VIT D DEFICIENCY, FRACTURES): Vit D, 25-Hydroxy: 32.1 ng/mL (ref 30.0–100.0)

## 2022-04-08 NOTE — Progress Notes (Signed)
Results sent through MyChart

## 2022-04-18 ENCOUNTER — Other Ambulatory Visit: Payer: Self-pay | Admitting: Medical

## 2022-04-18 ENCOUNTER — Other Ambulatory Visit (HOSPITAL_COMMUNITY): Payer: Self-pay

## 2022-04-18 MED ORDER — DOXYCYCLINE HYCLATE 100 MG PO TABS
100.0000 mg | ORAL_TABLET | Freq: Two times a day (BID) | ORAL | 0 refills | Status: DC
  Filled 2022-04-18: qty 20, 10d supply, fill #0

## 2022-04-21 ENCOUNTER — Ambulatory Visit
Admission: EM | Admit: 2022-04-21 | Discharge: 2022-04-21 | Disposition: A | Discharge status: Home / Self Care | Payer: Self-pay | Attending: Urgent Care | Admitting: Urgent Care

## 2022-04-21 DIAGNOSIS — Z1152 Encounter for screening for COVID-19: Secondary | ICD-10-CM | POA: Insufficient documentation

## 2022-04-21 DIAGNOSIS — J029 Acute pharyngitis, unspecified: Secondary | ICD-10-CM

## 2022-04-21 DIAGNOSIS — R6889 Other general symptoms and signs: Secondary | ICD-10-CM | POA: Insufficient documentation

## 2022-04-21 LAB — POCT RAPID STREP A (OFFICE): Rapid Strep A Screen: NEGATIVE

## 2022-04-21 NOTE — ED Provider Notes (Signed)
[facility]    CSN: [id number] Arrival date & time: 04/21/22  1816      History   Chief Complaint Chief Complaint  Patient presents with   Sore Throat    Body ache and chills - Entered by patient   Generalized Body Aches   Cough    HPI Jacqueline Winters is a 42 y.o. female.    Sore Throat  Cough   Presents to urgent care with complaint of sore throat, cough, body aches x 3 days.  States her throat is most sore on the left side on the top.  She endorses history of tonsil stones.  Past Medical History:  Diagnosis Date   Allergy    GERD (gastroesophageal reflux disease)    with pregnancy   Goiter    Headache(784.0)    Heart murmur    Hidradenitis axillaris    Impaired fasting blood sugar    Insomnia    Obesity    PONV (postoperative nausea and vomiting)    Thyroid nodule    biopsy 07/2013, consult with Dr. Iran Planas endocrinology    Patient Active Problem List   Diagnosis Date Noted   Scar painful 10/18/2021   S/P plastic surgery 10/18/2021   Iron deficiency 10/18/2021   COVID-19 long hauler 01/22/2021   Encounter for health maintenance examination in adult 02/03/2020   Screening for lipid disorders 02/03/2020   Family history of cancer 02/03/2020   Screening for condition 02/03/2020   Vitamin D deficiency 03/13/2018   Screening for cervical cancer 11/27/2017   Insomnia 07/30/2015   Screening for diabetes mellitus 07/30/2015   Rhinitis, allergic 07/30/2015   Hidradenitis axillaris 07/30/2015   Thyroid goiter 02/23/2015   Impaired fasting blood sugar 02/23/2015    Past Surgical History:  Procedure Laterality Date   CESAREAN SECTION  12/21/2011   Procedure: CESAREAN SECTION;  Surgeon: Farrel Gobble. Harrington Challenger, MD;  Location: [facility];  Service: Obstetrics;  Laterality: N/A;  Bilateral Tubal Ligation   CHOLECYSTECTOMY  2009   HYDRADENITIS EXCISION Right 09/14/2021   Procedure: EXCISION HIDRADENITIS AXILLA WITH COMPLEX CLOSURE;  Surgeon: Cindra Presume,  MD;  Location: [facility];  Service: Plastics;  Laterality: Right;  45 mins   INCISION AND DRAINAGE     right axillary boils   PILONIDAL CYST / SINUS EXCISION  2008   [facility]   TUBAL LIGATION     VAGINAL DELIVERY  2002, 1998    OB History     Gravida  3   Para  3   Term  3   Preterm      AB      Living  3      SAB      IAB      Ectopic      Multiple      Live Births  3            Home Medications    Prior to Admission medications   Medication Sig Start Date End Date Taking? Authorizing Provider  acetaminophen (TYLENOL) 500 MG tablet Take 500 mg by mouth every 6 (six) hours as needed.    [provider]  Cetirizine HCl (ZYRTEC ALLERGY PO) Take by mouth.    [provider]  cholecalciferol (VITAMIN D3) 25 MCG (1000 UNIT) tablet Take by mouth daily. 01/10/20   [provider]  doxycycline (VIBRA-TABS) 100 MG tablet Take 1 tablet (100 mg total) by mouth 2 (two) times daily for  10 days 04/18/22   Tysinger, Camelia Eng, PA-C  fluconazole (DIFLUCAN) 150 MG tablet Take 1 tablet (150 mg total) by mouth once a week. Patient not taking: Reported on 03/15/2022 01/12/22   Tysinger, Camelia Eng, PA-C  fluticasone (CUTIVATE) 0.05 % cream Apply 1 Application topically daily as needed for itch. 9/98/33     FOLIC ACID PO Take by mouth.    [provider]  ibuprofen (ADVIL) 600 MG tablet Take 1 tablet (600 mg total) by mouth every 6 (six) hours as needed. 01/09/20   Rita Ohara, MD  iron polysaccharides (NIFEREX) 150 MG capsule TAKE 1 CAPSULE BY MOUTH EVERY DAY 11/03/21   Tysinger, Camelia Eng, PA-C  Magnesium 200 MG TABS Take 250 mg by mouth daily.    [provider]  Multiple Vitamin (MULTIVITAMIN) tablet Take 1 tablet by mouth daily.    [provider]  Multiple Vitamins-Minerals (ZINC PO) Take by mouth daily in the afternoon.    [provider]  naproxen sodium (ALEVE) 220 MG tablet Take 440 mg by  mouth. Patient not taking: Reported on 04/06/2022    [provider]  Nutritional Supplements (GLUCOSAMINE COMPLEX PO) Take 3 tablets by mouth daily.    [provider]  oseltamivir (TAMIFLU) 75 MG capsule Take 1 capsule (75 mg total) by mouth 2 (two) times daily. Patient not taking: Reported on 04/06/2022 03/15/22   Tysinger, Camelia Eng, PA-C  VITAMIN E PO Take 1 capsule by mouth daily.    [provider]    Family History Family History  Problem Relation Age of Onset   Hypertension Mother    Diabetes Mother    Hypertension Sister    Cancer Maternal Grandmother        colon, ovarian   Hypertension Sister    Arthritis Father    Cancer Maternal Aunt        breast   Heart disease Neg Hx    Stroke Neg Hx     Social History Social History   Tobacco Use   Smoking status: Former    Packs/day: 0.30    Years: 13.00    Total pack years: 3.90    Types: Cigarettes    Quit date: 11/21/2020    Years since quitting: 1.4   Smokeless tobacco: Never  Vaping Use   Vaping Use: Never used  Substance Use Topics   Alcohol use: Yes    Comment: occasional   Drug use: No     Allergies   Hydrocodone-acetaminophen and Vicodin [hydrocodone-acetaminophen]   Review of Systems Review of Systems  Respiratory:  Positive for cough.      Physical Exam Triage Vital Signs ED Triage Vitals  Enc Vitals Group     BP 04/21/22 1919 130/79     Pulse Rate 04/21/22 1919 83     Resp 04/21/22 1919 17     Temp 04/21/22 1919 99.1 F (37.3 C)     Temp src --      SpO2 04/21/22 1919 97 %     Weight --      Height --      Head Circumference --      Peak Flow --      Pain Score 04/21/22 1920 0     Pain Loc --      Pain Edu? --      Excl. in [place]? --    No data found.  Updated Vital Signs BP 130/79   Pulse 83   Temp 99.1  F (37.3 C)   Resp 17   LMP 04/18/2022 (Approximate)   SpO2 97%   Visual Acuity Right Eye Distance:   Left Eye Distance:   Bilateral Distance:     Right Eye Near:   Left Eye Near:    Bilateral Near:     Physical Exam Vitals reviewed.  Constitutional:      Appearance: She is well-developed. She is ill-appearing.  HENT:     Mouth/Throat:     Mouth: Mucous membranes are moist.     Pharynx: Oropharyngeal exudate and posterior oropharyngeal erythema present.     Tonsils: Tonsillar exudate present. 2+ on the right. 2+ on the left.     Comments: Tonsillar exudate versus tonsillar stones, present especially on the left side of her oropharynx. Cardiovascular:     Rate and Rhythm: Normal rate and regular rhythm.  Pulmonary:     Effort: Pulmonary effort is normal.     Breath sounds: Normal breath sounds.  Skin:    General: Skin is warm and dry.  Neurological:     General: No focal deficit present.     Mental Status: She is alert and oriented to person, place, and time.  Psychiatric:        Mood and Affect: Mood normal.        Behavior: Behavior normal.      UC Treatments / Results  Labs (all labs ordered are listed, but only abnormal results are displayed) Labs Reviewed  POCT RAPID STREP A (OFFICE)    EKG   Radiology No results found.  Procedures Procedures (including critical care time)  Medications Ordered in UC Medications - No data to display  Initial Impression / Assessment and Plan / UC Course  I have reviewed the triage vital signs and the nursing notes.  Pertinent labs & imaging results that were available during my care of the patient were reviewed by me and considered in my medical decision making (see chart for details).   Patient is afebrile here without recent antipyretics. Satting well on room air. Overall is ill appearing, well hydrated, without respiratory distress. Pulmonary exam is unremarkable.  Lungs CTAB without wheezing, rhonchi, rales.  Oropharynx is erythematous with 2+ tonsils bilaterally.  There is exudate versus tonsillar stones on the left side of her oropharynx.  Rapid strep is  negative.  Symptoms are consistent with acute viral process including influenza.  Unfortunately she is outside the window of treatment with antiviral therapy and will recommend continued use of OTC medication for symptom control.  Final Clinical Impressions(s) / UC Diagnoses   Final diagnoses:  None   Discharge Instructions   None    ED Prescriptions   None    PDMP not reviewed this encounter.   Rose Phi, [place] 04/21/22 1940

## 2022-04-21 NOTE — ED Triage Notes (Signed)
Pt. Presents to UC w/ c/o a sore throat, cough and body aches for the past 3 days.

## 2022-04-21 NOTE — Discharge Instructions (Addendum)
You have been diagnosed with a viral upper respiratory infection based on your symptoms and exam. Viral illnesses cannot be treated with antibiotics - they are self limiting - and you should find your symptoms resolving within a few days. Get plenty of rest and non-caffeinated fluids. Watch for signs of dehydration including reduced urine output and dark colored urine.  We recommend you use over-the-counter medications for symptom control including acetaminophen (Tylenol), ibuprofen (Advil/Motrin) or naproxen (Aleve) for throat pain, fever, chills or body aches. You may combine use of acetaminophen and ibuprofen/naproxen if needed.  Some patients find an pain-relieving throat spray such as Chloraseptic to be effective.     Also recommend cold/cough medication containing a cough suppressant such as dextromethorphan, as needed.  Saline mist spray is helpful for removing excess mucus from your nose.  Room humidifiers are helpful to ease breathing at night. I recommend guaifenesin (Mucinex) with plenty of water throughout the day to help thin and loosen mucus secretions in your respiratory passages.   If appropriate based upon your other medical problems, you might also find relief of nasal/sinus congestion symptoms by using a nasal decongestant such as fluticasone (Flonase ) or pseudoephedrine (Sudafed sinus).  You will need to obtain Sudafed from behind the pharmacist counter.  Speak to the pharmacist to verify that you are not duplicating medications with other over-the-counter formulations that you may be using.   

## 2022-04-22 LAB — SARS CORONAVIRUS 2 (TAT 6-24 HRS): SARS Coronavirus 2: NEGATIVE

## 2022-06-29 ENCOUNTER — Ambulatory Visit: Payer: 59 | Admitting: Medical

## 2022-06-29 ENCOUNTER — Other Ambulatory Visit (HOSPITAL_COMMUNITY): Payer: Self-pay

## 2022-06-29 VITALS — BP 110/70 | HR 64 | Wt 226.8 lb

## 2022-06-29 DIAGNOSIS — R5383 Other fatigue: Secondary | ICD-10-CM

## 2022-06-29 DIAGNOSIS — R519 Headache, unspecified: Secondary | ICD-10-CM | POA: Diagnosis not present

## 2022-06-29 DIAGNOSIS — R8290 Unspecified abnormal findings in urine: Secondary | ICD-10-CM

## 2022-06-29 DIAGNOSIS — N924 Excessive bleeding in the premenopausal period: Secondary | ICD-10-CM | POA: Diagnosis not present

## 2022-06-29 DIAGNOSIS — Z862 Personal history of diseases of the blood and blood-forming organs and certain disorders involving the immune mechanism: Secondary | ICD-10-CM

## 2022-06-29 LAB — POCT URINALYSIS DIP (PROADVANTAGE DEVICE)
Glucose, UA: NEGATIVE mg/dL
Nitrite, UA: POSITIVE — AB
Protein Ur, POC: >=300 mg/dL — AB
Specific Gravity, Urine: 1.025
Urobilinogen, Ur: 2
pH, UA: 6.5 (ref 5.0–8.0)

## 2022-06-29 LAB — POCT URINE PREGNANCY: Preg Test, Ur: NEGATIVE

## 2022-06-29 MED ORDER — IBUPROFEN 800 MG PO TABS
800.0000 mg | ORAL_TABLET | Freq: Three times a day (TID) | ORAL | 0 refills | Status: DC | PRN
  Filled 2022-06-29: qty 30, 10d supply, fill #0

## 2022-06-29 MED ORDER — SULFAMETHOXAZOLE-TRIMETHOPRIM 800-160 MG PO TABS
1.0000 | ORAL_TABLET | Freq: Two times a day (BID) | ORAL | 0 refills | Status: DC
  Filled 2022-06-29: qty 14, 7d supply, fill #0

## 2022-06-29 NOTE — Progress Notes (Signed)
After she left her urine showed possible infection.  I sent an antibiotic to the pharmacy.  The culture will take a few days to come back.  I am sending this to Dr. Redmond School to keep an eye  out for the urine culture since I will be out of town next week

## 2022-06-29 NOTE — Progress Notes (Signed)
Subjective:  Jacqueline Winters is a 42 y.o. female who presents for Chief Complaint  Patient presents with   Menstrual Problem    Menstrual since 3/2, constant flow with clots, no energy, Headaches, abdominal pain. Has obgyn April 11 but couldn't wait that long     Here for fatigue, heavy menstrual bleeding ongoing since 06/11/22.  It slowed down some last week but still spotting, then this week worse again.  Prior to this month was getting regular menstruation around first week of month but heavy for 5-7 days each time.  In December menstrual period was coming on 3rd week of month.  Married, no concern for STD.  Having some sharp pains yesterday.  Last time she has heavy menstrual periods like this was end of 2022.   She tried 10 day Provera then and didn't tolerate that, felt like her skin was crawling.   Sees Dr. Harrington Challenger at [facility]  She does have some constipation in general  In addition to heavy menstrual bleeding and fatigue also has some headaches, some floaters in her vision, and does feel like she wants to lie in the bed feeling fatigue and weak.   She denies urinary symptoms, vaginal discharge, no fever, no body aches or chills.  No other aggravating or relieving factors.    No other c/o.  Past Medical History:  Diagnosis Date   Allergy    GERD (gastroesophageal reflux disease)    with pregnancy   Goiter    Headache(784.0)    Heart murmur    Hidradenitis axillaris    Impaired fasting blood sugar    Insomnia    Obesity    PONV (postoperative nausea and vomiting)    Thyroid nodule    biopsy 07/2013, consult with Dr. Iran Planas endocrinology   Current Outpatient Medications on File Prior to Visit  Medication Sig Dispense Refill   acetaminophen (TYLENOL) 500 MG tablet Take 500 mg by mouth every 6 (six) hours as needed.     Cetirizine HCl (ZYRTEC ALLERGY PO) Take by mouth.     cholecalciferol (VITAMIN D3) 25 MCG (1000 UNIT) tablet Take by mouth daily.     FOLIC  ACID PO Take by mouth.     iron polysaccharides (NIFEREX) 150 MG capsule TAKE 1 CAPSULE BY MOUTH EVERY DAY 90 capsule 0   Magnesium 200 MG TABS Take 250 mg by mouth daily.     Multiple Vitamin (MULTIVITAMIN) tablet Take 1 tablet by mouth daily.     Multiple Vitamins-Minerals (ZINC PO) Take by mouth daily in the afternoon.     Nutritional Supplements (GLUCOSAMINE COMPLEX PO) Take 3 tablets by mouth daily.     VITAMIN E PO Take 1 capsule by mouth daily.     No current facility-administered medications on file prior to visit.    The following portions of the patient's history were reviewed and updated as appropriate: allergies, current medications, past family history, past medical history, past social history, past surgical history and problem list.  ROS Otherwise as in subjective above    Objective: BP 110/70   Pulse 64   Wt 226 lb 12.8 oz (102.9 kg)   BMI 40.18 kg/m   Wt Readings from Last 3 Encounters:  06/29/22 226 lb 12.8 oz (102.9 kg)  04/06/22 225 lb 6 oz (102.2 kg)  03/15/22 217 lb (98.4 kg)    General appearance: alert, no distress, well developed, well nourished Neck: supple, no lymphadenopathy, no thyromegaly, no masses Heart: RRR, normal  S1, S2, no murmurs Lungs: CTA bilaterally, no wheezes, rhonchi, or rales Abdomen: +bs, soft, mild lower generalized abdominal tendnerss otherwise non tender, non distended, no masses, no hepatomegaly, no splenomegaly Pulses: 2+ radial pulses, 2+ pedal pulses, normal cap refill Ext: no edema Gyn - deferred/declined    Assessment: Encounter Diagnoses  Name Primary?   Excessive bleeding in premenopausal period Yes   Other fatigue    Intractable headache, unspecified chronicity pattern, unspecified headache type    History of iron deficiency anemia    Abnormal urinalysis      Plan: Likely hormonal as the cause of her heavy bleeding.  I advise she go ahead and get back to gynecology for a follow-up appointment  In the  meantime labs as below today.  She did not tolerate Provera pulsed dosing in the past very well so she wants to avoid hormones today.  Begin ibuprofen 800 mg 3 times a day for the next few days to hopefully calm down the bleeding  She will likely need to go up to 2 or 3 times a day iron dosing.  She is currently on every other day iron dosing as she was not anemic last fall  Urinalysis abnormal, begin Bactrim antibiotic, urine culture sent  Await labs  Asharia was seen today for menstrual problem.  Diagnoses and all orders for this visit:  Excessive bleeding in premenopausal period -     Iron, TIBC and Ferritin Panel -     CBC with Differential/Platelet -     POCT urine pregnancy -     POCT Urinalysis DIP (Proadvantage Device) -     Urine Culture  Other fatigue -     CBC with Differential/Platelet -     Urine Culture  Intractable headache, unspecified chronicity pattern, unspecified headache type  History of iron deficiency anemia -     Iron, TIBC and Ferritin Panel -     CBC with Differential/Platelet  Abnormal urinalysis -     Urine Culture  Other orders -     ibuprofen (ADVIL) 800 MG tablet; Take 1 tablet (800 mg total) by mouth every 8 (eight) hours as needed. -     sulfamethoxazole-trimethoprim (BACTRIM DS) 800-160 MG tablet; Take 1 tablet by mouth 2 (two) times daily.    Follow up: pending labs

## 2022-06-29 NOTE — Patient Instructions (Signed)
Heavy menstrual bleeding Follow-up with gynecology same We will call tomorrow with lab results Begin ibuprofen 800 mg prescription 3 times a day for the next 5 to 7 days to help slow down the current bleeding and cramping Hydrate well, rest, avoid foods that constipate you  Menorrhagia Menorrhagia is when your monthly periods are heavy or last longer than normal. If you have this condition, bleeding and cramping may make it hard for you to do your daily activities. What are the causes? Common causes of this condition include: Growths in the womb (uterus). These are polyps or fibroids. These growths are not cancer. Problems with two hormones called estrogen and progesterone. One of the ovaries not releasing an egg during one or more months. A problem with the thyroid gland. Having a device for birth control (IUD). Side effects of some medicines, such as NSAIDs or blood thinners. A disorder that stops the blood from clotting normally. What increases the risk? You are more likely to have this condition if you have cancer of the womb. What are the signs or symptoms? Having to change your pad or tampon every 1-2 hours because it is soaked. Needing to use pads and tampons at the same time because of heavy bleeding. Needing to wake up to change your pads or tampons during the night. Passing blood clots larger than 1 inch (2.5 cm) in size. Having bleeding that lasts for more than 7 days. Having symptoms of low iron levels (anemia), such as feeling tired or having shortness of breath. How is this treated? You may not need to be treated for this condition. But if you need treatment, you may be given medicines: To reduce bleeding during your period. These include birth control medicines. To make your blood thick. This slows bleeding. To reduce swelling. Medicines that do this include ibuprofen. That have a hormone called progestin. That make the ovaries stop working for a short time. To treat  low iron levels. You will be given iron pills if you have this condition. If medicines do not work, surgery may be done. Surgery may be done to: Remove a part of the lining of the womb. This lining is called the endometrium. This reduces bleeding during a period. Remove growths in the womb. These may be polyps or fibroids. Remove the entire lining of the womb. Remove the womb entirely. This procedure is called a hysterectomy. Follow these instructions at home: Medicines Take over-the-counter and prescription medicines only as told by your doctor. This includes iron pills. Do not change or switch medicines without asking your doctor. Do not take aspirin or medicines that contain aspirin 1 week before or during your period. Aspirin may make bleeding worse. Managing constipation Iron pills may cause trouble pooping (constipation). To prevent or treat problems when pooping, you may need to: Drink enough fluid to keep your pee (urine) pale yellow. Take over-the-counter or prescription medicines. Eat foods that are high in fiber. These include beans, whole grains, and fresh fruits and vegetables. Limit foods that are high in fat and sugar. These include fried or sweet foods. General instructions If you need to change your pad or tampon more than once every 2 hours, limit your activity until the bleeding stops. Eat healthy meals and foods that are high in iron. Foods that have a lot of iron include: Leafy green vegetables. Meat. Liver. Eggs. Whole-grain breads and cereals. Do not try to lose weight until your heavy bleeding has stopped and you have normal amounts of iron  in your blood. If you need to lose weight, work with your doctor. Keep all follow-up visits. Contact a doctor if: You soak through a pad or tampon every 1 or 2 hours, and this happens every time you have a period. You need to use pads and tampons at the same time because you are bleeding so much. You are taking medicine,  and: You feel like you may vomit. You vomit. You have watery poop (diarrhea). You have other problems that may be related to the medicine you are taking. Get help right away if: You soak through more than a pad or tampon in 1 hour. You pass clots bigger than 1 inch (2.5 cm) wide. You feel short of breath. You feel like your heart is beating too fast. You feel dizzy or you faint. You feel very weak or tired. Summary Menorrhagia is when your menstrual periods are heavy or last longer than normal. You may not need to be treated for this condition. If you need treatment, you may be given medicines or have surgery. Take over-the-counter and prescription medicines only as told by your doctor. This includes iron pills. Get help right away if you soak through more than a pad or tampon in 1 hour or you pass large clots. Also, get help right away if you feel dizzy, short of breath, or very weak or tired. This information is not intended to replace advice given to you by your health care provider. Make sure you discuss any questions you have with your health care provider. Document Revised: 12/10/2019 Document Reviewed: 12/10/2019 Elsevier Patient Education  [place].

## 2022-06-30 ENCOUNTER — Other Ambulatory Visit: Payer: Self-pay | Admitting: Medical

## 2022-06-30 ENCOUNTER — Emergency Department
Admission: EM | Admit: 2022-06-30 | Discharge: 2022-06-30 | Disposition: A | Discharge status: Home / Self Care | Payer: 59 | Attending: Emergency Medicine | Admitting: Emergency Medicine

## 2022-06-30 ENCOUNTER — Other Ambulatory Visit (HOSPITAL_COMMUNITY): Payer: Self-pay

## 2022-06-30 DIAGNOSIS — N939 Abnormal uterine and vaginal bleeding, unspecified: Secondary | ICD-10-CM | POA: Diagnosis not present

## 2022-06-30 DIAGNOSIS — D500 Iron deficiency anemia secondary to blood loss (chronic): Secondary | ICD-10-CM | POA: Diagnosis not present

## 2022-06-30 LAB — URINALYSIS, ROUTINE W REFLEX MICROSCOPIC
Bacteria, UA: NONE SEEN
Bilirubin Urine: NEGATIVE
Glucose, UA: NEGATIVE mg/dL
Ketones, ur: NEGATIVE mg/dL
Leukocytes,Ua: NEGATIVE
Nitrite: NEGATIVE
RBC / HPF: >50 RBC/hpf (ref 0–5)
Specific Gravity, Urine: >1.03 — ABNORMAL HIGH (ref 1.005–1.030)
pH: 6.5 (ref 5.0–8.0)

## 2022-06-30 LAB — CBC WITH DIFFERENTIAL/PLATELET
Basophils Absolute: 0 10*3/uL (ref 0.0–0.2)
Basos: 1 %
EOS (ABSOLUTE): 0.1 10*3/uL (ref 0.0–0.4)
Eos: 1 %
Hematocrit: 36.5 % (ref 34.0–46.6)
Hemoglobin: 11.5 g/dL (ref 11.1–15.9)
Immature Grans (Abs): 0 10*3/uL (ref 0.0–0.1)
Immature Granulocytes: 0 %
Lymphocytes Absolute: 1.7 10*3/uL (ref 0.7–3.1)
Lymphs: 27 %
MCH: 27 pg (ref 26.6–33.0)
MCHC: 31.5 g/dL (ref 31.5–35.7)
MCV: 86 fL (ref 79–97)
Monocytes Absolute: 0.5 10*3/uL (ref 0.1–0.9)
Monocytes: 9 %
Neutrophils Absolute: 4 10*3/uL (ref 1.4–7.0)
Neutrophils: 62 %
Platelets: 321 10*3/uL (ref 150–450)
RBC: 4.26 x10E6/uL (ref 3.77–5.28)
RDW: 14.7 % (ref 11.7–15.4)
WBC: 6.4 10*3/uL (ref 3.4–10.8)

## 2022-06-30 LAB — BASIC METABOLIC PANEL
Anion gap: 10 (ref 5–15)
BUN: 13 mg/dL (ref 6–20)
CO2: 22 mmol/L (ref 22–32)
Calcium: 8.6 mg/dL — ABNORMAL LOW (ref 8.9–10.3)
Chloride: 105 mmol/L (ref 98–111)
Creatinine, Ser: 0.87 mg/dL (ref 0.44–1.00)
GFR, Estimated: >60 mL/min (ref >60)
Glucose, Bld: 80 mg/dL (ref 70–99)
Potassium: 3.5 mmol/L (ref 3.5–5.1)
Sodium: 137 mmol/L (ref 135–145)

## 2022-06-30 LAB — IRON,TIBC AND FERRITIN PANEL
Ferritin: 12 ng/mL — ABNORMAL LOW (ref 15–150)
Iron Saturation: 10 % — ABNORMAL LOW (ref 15–55)
Iron: 39 ug/dL (ref 27–159)
Total Iron Binding Capacity: 378 ug/dL (ref 250–450)
UIBC: 339 ug/dL (ref 131–425)

## 2022-06-30 LAB — CBC
HCT: 36.8 % (ref 36.0–46.0)
Hemoglobin: 11.6 g/dL — ABNORMAL LOW (ref 12.0–15.0)
MCH: 27.2 pg (ref 26.0–34.0)
MCHC: 31.5 g/dL (ref 30.0–36.0)
MCV: 86.2 fL (ref 80.0–100.0)
Platelets: 337 10*3/uL (ref 150–400)
RBC: 4.27 MIL/uL (ref 3.87–5.11)
RDW: 14.9 % (ref 11.5–15.5)
WBC: 8.4 10*3/uL (ref 4.0–10.5)
nRBC: 0 % (ref 0.0–0.2)

## 2022-06-30 LAB — TYPE AND SCREEN
ABO/RH(D): B POS
Antibody Screen: NEGATIVE

## 2022-06-30 LAB — POC URINE PREG, ED: Preg Test, Ur: NEGATIVE

## 2022-06-30 MED ORDER — SODIUM CHLORIDE 0.9 % IV SOLN
250.0000 mg | INTRAVENOUS | Status: AC
  Administered 2022-06-30: 250 mg via INTRAVENOUS
  Filled 2022-06-30: qty 250

## 2022-06-30 MED ORDER — POLYSACCHARIDE IRON COMPLEX 150 MG PO CAPS
150.0000 mg | ORAL_CAPSULE | Freq: Two times a day (BID) | ORAL | 0 refills | Status: DC
  Filled 2022-06-30: qty 60, 30d supply, fill #0
  Filled 2022-07-20: qty 180, 90d supply, fill #0

## 2022-06-30 NOTE — ED Provider Notes (Signed)
[facility] Provider Note    Event Date/Time   First MD Initiated Contact with Patient 06/30/22 1241     (approximate)   History   Vaginal Bleeding   HPI  Jacqueline Winters is a 42 y.o. female with history of anemia, hidradenitis, thyroid nodule presents emergency department with heavy vaginal bleeding.  Patient states she has been bleeding for 19 days.  Her PCP did offer her progesterone but states the last time she took it made her feel really bad.  She is considering a uterine ablation.  However in the meantime her ferritin has been very low and her PCP sent her to the ER for a iron infusion.      Physical Exam   Triage Vital Signs: ED Triage Vitals  Enc Vitals Group     BP 06/30/22 1159 133/83     Pulse Rate 06/30/22 1159 65     Resp 06/30/22 1202 17     Temp 06/30/22 1159 98.5 F (36.9 C)     Temp Source 06/30/22 1159 Oral     SpO2 06/30/22 1159 98 %     Weight 06/30/22 1200 226 lb (102.5 kg)     Height --      Head Circumference --      Peak Flow --      Pain Score 06/30/22 1200 0     Pain Loc --      Pain Edu? --      Excl. in [place]? --     Most recent vital signs: Vitals:   06/30/22 1202 06/30/22 1638  BP:  116/78  Pulse:  66  Resp: 17 16  Temp:  98.7 F (37.1 C)  SpO2:  100%     General: Awake, no distress.   CV:  Good peripheral perfusion.regular rate and  rhythm Resp:  Normal effort. Lungs cta Abd:  No distention.   Other:      ED Results / Procedures / Treatments   Labs (all labs ordered are listed, but only abnormal results are displayed) Labs Reviewed  CBC - Abnormal; Notable for the following components:      Result Value   Hemoglobin 11.6 (*)    All other components within normal limits  BASIC METABOLIC PANEL - Abnormal; Notable for the following components:   Calcium 8.6 (*)    All other components within normal limits  URINALYSIS, ROUTINE W REFLEX MICROSCOPIC - Abnormal; Notable for the following  components:   Color, Urine AMBER (*)    APPearance CLOUDY (*)    Specific Gravity, Urine >1.030 (*)    Hgb urine dipstick LARGE (*)    Protein, ur TRACE (*)    All other components within normal limits  POC URINE PREG, ED  TYPE AND SCREEN     EKG     RADIOLOGY     PROCEDURES:   Procedures   MEDICATIONS ORDERED IN ED: Medications  ferric gluconate (FERRLECIT) 250 mg in sodium chloride 0.9 % 250 mL IVPB (0 mg Intravenous Stopped 06/30/22 1635)     IMPRESSION / MDM / ASSESSMENT AND PLAN / ED COURSE  I reviewed the triage vital signs and the nursing notes.                              Differential diagnosis includes, but is not limited to, anemia, hemorrhage, menorrhagia  Patient's presentation is most consistent with acute presentation with potential  threat to life or bodily function.   The patient's labs are reassuring, hemoglobin is at 11.6, I did review her ferritin and iron saturation levels from her PCPs office.  They are a little low.  Discussed this with Dr. Joni Fears he recommends ferric gluconate.  We did order this.  Will be a IV infusion.  Patient is in agreement with this treatment plan.  She did give consent for the infusion.  She was discharged in stable condition after the infusion     FINAL CLINICAL IMPRESSION(S) / ED DIAGNOSES   Final diagnoses:  Iron deficiency anemia due to chronic blood loss     Rx / DC Orders   ED Discharge Orders     None        Note:  This document was prepared using Dragon voice recognition software and may include unintentional dictation errors.    Versie Starks, PA-C 06/30/22 1755    Carrie Mew, MD 06/30/22 [id number]

## 2022-06-30 NOTE — Progress Notes (Signed)
Iron quite low. Begin Niferex twice daily if you can tolerate this.   If not, use at least once daily.  Get into see your gynecologist soon within next few weeks to discuss bleeding

## 2022-06-30 NOTE — ED Triage Notes (Signed)
Pt sts that she has been having vaginal bleeding for the last 19 days and her PCP sts that her iron is low. Pt sts that she also has used two regular sized menstrual pads. Pt sts also that she has a UTI and was put on a abx for that.

## 2022-06-30 NOTE — Telephone Encounter (Signed)
Patient is getting a ferritin type treatment called ferelicit in the ED for pt.

## 2022-06-30 NOTE — Telephone Encounter (Signed)
Pt is currently at the ED at [facility] and I have asked Toni Arthurs, RN to see if they can do the fermene 510mg 

## 2022-06-30 NOTE — Discharge Instructions (Signed)
Follow-up with your regular doctor Return if worsening Ask your doctor if you need another iron infusions ordered as outpatient to save you a ER bill.

## 2022-07-01 LAB — URINE CULTURE

## 2022-07-05 ENCOUNTER — Ambulatory Visit: Payer: 59 | Admitting: Family Medicine

## 2022-07-05 VITALS — BP 120/65 | HR 72 | Wt 228.2 lb

## 2022-07-05 DIAGNOSIS — Z862 Personal history of diseases of the blood and blood-forming organs and certain disorders involving the immune mechanism: Secondary | ICD-10-CM | POA: Diagnosis not present

## 2022-07-05 DIAGNOSIS — N924 Excessive bleeding in the premenopausal period: Secondary | ICD-10-CM | POA: Diagnosis not present

## 2022-07-05 LAB — CBC WITH DIFFERENTIAL/PLATELET
Basophils Absolute: 0 10*3/uL (ref 0.0–0.2)
Basos: 1 %
EOS (ABSOLUTE): 0.1 10*3/uL (ref 0.0–0.4)
Eos: 2 %
Hematocrit: 35.7 % (ref 34.0–46.6)
Hemoglobin: 11.6 g/dL (ref 11.1–15.9)
Immature Grans (Abs): 0 10*3/uL (ref 0.0–0.1)
Immature Granulocytes: 0 %
Lymphocytes Absolute: 1.8 10*3/uL (ref 0.7–3.1)
Lymphs: 28 %
MCH: 27.5 pg (ref 26.6–33.0)
MCHC: 32.5 g/dL (ref 31.5–35.7)
MCV: 85 fL (ref 79–97)
Monocytes Absolute: 0.5 10*3/uL (ref 0.1–0.9)
Monocytes: 7 %
Neutrophils Absolute: 4.1 10*3/uL (ref 1.4–7.0)
Neutrophils: 62 %
Platelets: 320 10*3/uL (ref 150–450)
RBC: 4.22 x10E6/uL (ref 3.77–5.28)
RDW: 14.8 % (ref 11.7–15.4)
WBC: 6.5 10*3/uL (ref 3.4–10.8)

## 2022-07-05 LAB — COMPREHENSIVE METABOLIC PANEL
ALT: 10 IU/L (ref 0–32)
AST: 11 IU/L (ref 0–40)
Albumin/Globulin Ratio: 1.5 (ref 1.2–2.2)
Albumin: 4.2 g/dL (ref 3.9–4.9)
Alkaline Phosphatase: 61 IU/L (ref 44–121)
BUN/Creatinine Ratio: 13 (ref 9–23)
BUN: 13 mg/dL (ref 6–24)
Bilirubin Total: <0.2 mg/dL (ref 0.0–1.2)
CO2: 20 mmol/L (ref 20–29)
Calcium: 9.5 mg/dL (ref 8.7–10.2)
Chloride: 105 mmol/L (ref 96–106)
Creatinine, Ser: 0.97 mg/dL (ref 0.57–1.00)
Globulin, Total: 2.8 g/dL (ref 1.5–4.5)
Glucose: 75 mg/dL (ref 70–99)
Potassium: 4.4 mmol/L (ref 3.5–5.2)
Sodium: 140 mmol/L (ref 134–144)
Total Protein: 7 g/dL (ref 6.0–8.5)
eGFR: 75 mL/min/{1.73_m2} (ref >59)

## 2022-07-05 NOTE — Progress Notes (Signed)
   Subjective:    Patient ID: Jacqueline Winters, female    DOB: 05/24/1980, 42 y.o.   MRN: [id number]  HPI She is here for a recheck.  She states that her bleeding stopped yesterday.  She has been taking a multivitamin with extra iron as well as iron supplementation.  She has had difficulty with menstrual bleeding for quite some time and is working with gynecologist to get this under better control.  Recent blood work did show low iron and there is a question of whether she needs IV iron.   Review of Systems     Objective:   Physical Exam Alert and in no distress otherwise not examined       Assessment & Plan:  History of iron deficiency anemia - Plan: CBC with Differential/Platelet  Excessive bleeding in premenopausal period - Plan: CBC with Differential/Platelet, Comprehensive metabolic panel She will work with her gynecologist to try and get the menstrual bleeding under better control.  I will do blood work on her today see where she is at.  Recheck here in about another month or 2 to reevaluate her CBC and probably do iron, ferritin, TIBC.

## 2022-07-12 ENCOUNTER — Other Ambulatory Visit (HOSPITAL_COMMUNITY): Payer: Self-pay

## 2022-07-20 ENCOUNTER — Other Ambulatory Visit (HOSPITAL_COMMUNITY): Payer: Self-pay

## 2022-07-21 ENCOUNTER — Other Ambulatory Visit (HOSPITAL_COMMUNITY): Payer: Self-pay

## 2022-07-21 LAB — HM PAP SMEAR: HM Pap smear: NEGATIVE

## 2022-07-21 LAB — RESULTS CONSOLE HPV: CHL HPV: NEGATIVE

## 2022-07-21 MED ORDER — TRANEXAMIC ACID 650 MG PO TABS
1300.0000 mg | ORAL_TABLET | Freq: Three times a day (TID) | ORAL | 4 refills | Status: DC
  Filled 2022-07-21: qty 30, 5d supply, fill #0
  Filled 2022-10-08: qty 30, 5d supply, fill #1
  Filled 2023-05-30: qty 30, 5d supply, fill #2

## 2022-07-29 ENCOUNTER — Encounter: Payer: Self-pay | Admitting: Internal Medicine

## 2022-08-02 ENCOUNTER — Other Ambulatory Visit: Payer: Self-pay | Admitting: Medical

## 2022-08-02 ENCOUNTER — Other Ambulatory Visit (HOSPITAL_COMMUNITY): Payer: Self-pay

## 2022-08-02 MED ORDER — IBUPROFEN 800 MG PO TABS
800.0000 mg | ORAL_TABLET | Freq: Three times a day (TID) | ORAL | 0 refills | Status: DC | PRN
  Filled 2022-08-02: qty 30, 10d supply, fill #0

## 2022-08-06 ENCOUNTER — Telehealth: Payer: 59 | Admitting: Family Medicine

## 2022-08-06 DIAGNOSIS — B9689 Other specified bacterial agents as the cause of diseases classified elsewhere: Secondary | ICD-10-CM | POA: Diagnosis not present

## 2022-08-06 DIAGNOSIS — J019 Acute sinusitis, unspecified: Secondary | ICD-10-CM | POA: Diagnosis not present

## 2022-08-06 MED ORDER — DOXYCYCLINE HYCLATE 100 MG PO TABS: 100.0000 mg | ORAL_TABLET | Freq: Two times a day (BID) | ORAL | 0 refills | Status: DC

## 2022-08-06 MED ORDER — DOXYCYCLINE HYCLATE 100 MG PO TABS: 100.0000 mg | ORAL_TABLET | Freq: Two times a day (BID) | ORAL | 0 refills | Status: AC

## 2022-08-06 NOTE — Patient Instructions (Signed)

## 2022-08-06 NOTE — Progress Notes (Signed)
Virtual Visit Consent   Jacqueline Winters, you are scheduled for a virtual visit with a [facility] provider today. Just as with appointments in the office, your consent must be obtained to participate. Your consent will be active for this visit and any virtual visit you may have with one of our providers in the next 365 days. If you have a MyChart account, a copy of this consent can be sent to you electronically.  As this is a virtual visit, video technology does not allow for your provider to perform a traditional examination. This may limit your provider's ability to fully assess your condition. If your provider identifies any concerns that need to be evaluated in person or the need to arrange testing (such as labs, EKG, etc.), we will make arrangements to do so. Although advances in technology are sophisticated, we cannot ensure that it will always work on either your end or our end. If the connection with a video visit is poor, the visit may have to be switched to a telephone visit. With either a video or telephone visit, we are not always able to ensure that we have a secure connection.  By engaging in this virtual visit, you consent to the provision of healthcare and authorize for your insurance to be billed (if applicable) for the services provided during this visit. Depending on your insurance coverage, you may receive a charge related to this service.  I need to obtain your verbal consent now. Are you willing to proceed with your visit today? Jacqueline Winters has provided verbal consent on 08/06/2022 for a virtual visit (video or telephone). Georgana Curio, FNP  Date: 08/06/2022 1:05 PM  Virtual Visit via Video Note   I, Georgana Curio, connected with  CHALSEY LEETH  ([id number], January 05, 1981) on 08/06/22 at  1:00 PM EDT by a video-enabled telemedicine application and verified that I am speaking with the correct person using two identifiers.  Location: Patient: Virtual Visit Location Patient:  Home Provider: Virtual Visit Location Provider: Office/Clinic   I discussed the limitations of evaluation and management by telemedicine and the availability of in person appointments. The patient expressed understanding and agreed to proceed.    History of Present Illness: Jacqueline Winters is a 42 y.o. who identifies as a female who was assigned female at birth, and is being seen today for maxillary sinus pain and pressure for over a week. Taking zyrtec, green mucus with foul odor from sinus, Saline not helping. No fever. 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   Encounter for health maintenance examination in adult 02/03/2020   Screening for lipid disorders 02/03/2020   Family history of cancer 02/03/2020   Screening for condition 02/03/2020   Vitamin D deficiency 03/13/2018   Screening for cervical cancer 11/27/2017   Insomnia 07/30/2015   Screening for diabetes mellitus 07/30/2015   Rhinitis, allergic 07/30/2015   Hidradenitis axillaris 07/30/2015   Thyroid goiter 02/23/2015   Impaired fasting blood sugar 02/23/2015    Allergies:  Allergies  Allergen Reactions   Hydrocodone-Acetaminophen Nausea Only   Vicodin [Hydrocodone-Acetaminophen]     uneasy   Medications:  Current Outpatient Medications:    doxycycline (VIBRA-TABS) 100 MG tablet, Take 1 tablet (100 mg total) by mouth 2 (two) times daily for 10 days., Disp: 20 tablet, Rfl: 0  acetaminophen (TYLENOL) 500 MG tablet, Take 500 mg by mouth every 6 (six) hours as needed., Disp: , Rfl:    Cetirizine HCl (ZYRTEC ALLERGY PO), Take by mouth., Disp: , Rfl:    cholecalciferol (VITAMIN D3) 25 MCG (1000 UNIT) tablet, Take by mouth daily., Disp: , Rfl:    FOLIC ACID PO, Take by mouth., Disp: , Rfl:    ibuprofen (ADVIL) 800 MG tablet, Take 1 tablet (800 mg total) by mouth  every 8 (eight) hours as needed., Disp: 30 tablet, Rfl: 0   iron polysaccharides (NIFEREX) 150 MG capsule, Take 1 capsule (150 mg total) by mouth 2 (two) times daily., Disp: 180 capsule, Rfl: 0   Magnesium 200 MG TABS, Take 250 mg by mouth daily., Disp: , Rfl:    Multiple Vitamin (MULTIVITAMIN) tablet, Take 1 tablet by mouth daily., Disp: , Rfl:    Multiple Vitamins-Minerals (ZINC PO), Take by mouth daily in the afternoon., Disp: , Rfl:    tranexamic acid (LYSTEDA) 650 MG TABS tablet, Take 2 tablets (1,300 mg total) by mouth 3 (three) times daily., Disp: 90 tablet, Rfl: 4   VITAMIN E PO, Take 1 capsule by mouth daily., Disp: , Rfl:   Observations/Objective: Patient is well-developed, well-nourished in no acute distress.  At work in no distress. 2  Head is normocephalic, atraumatic.  No labored breathing.  Speech is clear and coherent with logical content.  Patient is alert and oriented at baseline.    Assessment and Plan: 1. Acute bacterial sinusitis  Increase fluids, continue zyrtec, add flonase, ibuprofen as directed, UC if sx worsen.   Follow Up Instructions: I discussed the assessment and treatment plan with the patient. The patient was provided an opportunity to ask questions and all were answered. The patient agreed with the plan and demonstrated an understanding of the instructions.  A copy of instructions were sent to the patient via MyChart unless otherwise noted below.     The patient was advised to call back or seek an in-person evaluation if the symptoms worsen or if the condition fails to improve as anticipated.  Time:  I spent 8 minutes with the patient via telehealth technology discussing the above problems/concerns.    Georgana Curio, FNP

## 2022-08-08 ENCOUNTER — Ambulatory Visit: Payer: 59 | Admitting: Medical

## 2022-08-08 VITALS — BP 110/60 | HR 64 | Wt 232.6 lb

## 2022-08-08 DIAGNOSIS — G43811 Other migraine, intractable, with status migrainosus: Secondary | ICD-10-CM

## 2022-08-08 DIAGNOSIS — D649 Anemia, unspecified: Secondary | ICD-10-CM | POA: Diagnosis not present

## 2022-08-08 DIAGNOSIS — G43909 Migraine, unspecified, not intractable, without status migrainosus: Secondary | ICD-10-CM | POA: Insufficient documentation

## 2022-08-08 DIAGNOSIS — E611 Iron deficiency: Secondary | ICD-10-CM

## 2022-08-08 DIAGNOSIS — E559 Vitamin D deficiency, unspecified: Secondary | ICD-10-CM

## 2022-08-08 DIAGNOSIS — J301 Allergic rhinitis due to pollen: Secondary | ICD-10-CM | POA: Diagnosis not present

## 2022-08-08 DIAGNOSIS — N924 Excessive bleeding in the premenopausal period: Secondary | ICD-10-CM | POA: Insufficient documentation

## 2022-08-08 MED ORDER — NURTEC 75 MG PO TBDP: 1.0000 | ORAL_TABLET | Freq: Every day | ORAL | 0 refills | Status: DC | PRN

## 2022-08-08 NOTE — Patient Instructions (Signed)
Migraines Begin trial of Nurtec 75mg  to break the current migraine This medicaiton can be used every other day if several migraines monthly Let me know if this works and I can send month supply   Iron deficiency anemia She continues on iron oral twice daily Labs today to recheck since iron IV in late March at the emergency dept  Allergic rhinitis Continue allergy medication tablet in the evening Consider adding Flonase nasal daily in the morning Continue nasal saline flush daily  Heavy periods Follow up with gynecology as planned  Vitamin D deficiency Continue vitamin D supplement such as 1000 units OTC daily

## 2022-08-08 NOTE — Progress Notes (Signed)
Subjective: Chief Complaint  Patient presents with   1 month follow-up    1 month follow-up on iron. Had cycle since last visit but not as severe.    Here for 67mo follow up on anemia, iron deficiency.    Around 06/30/22 ended up going to emergency dept for anemia.  Ended up having iron IV at the emergency dept.  Since then using iron BID oral.  Ended up doing f/u here with Dr. Susann Givens after that visit for same. Labs were stable.  Did recent televisit for sinusitis this past week, was put on doxycycline.    She has headaches since March, but worsened over weekend with sinus infection.   Typically getting frontal or bilat temporal headaches.   The headaches can drain energy. Sometimes gets photophobia.  Gets some nausea.  No dizziness.  Sometimes gets blurred vision with headaches.  Lying down in quiet room sometimes helps.  Has used Topamax in the past for headaches/migraines.     Having allergy problems.  Using nasal saline, zyrtec at night.    Is seeing gynecology for heavy periods.   She was prescribed Lysteda to use during menstrual periods.  Having pelvic ultrasound soon.  Past Medical History:  Diagnosis Date   Allergy    GERD (gastroesophageal reflux disease)    with pregnancy   Goiter    Headache(784.0)    Heart murmur    Hidradenitis axillaris    Impaired fasting blood sugar    Insomnia    Obesity    PONV (postoperative nausea and vomiting)    Thyroid nodule    biopsy 07/2013, consult with Dr. Corwin Levins endocrinology   Current Outpatient Medications on File Prior to Visit  Medication Sig Dispense Refill   acetaminophen (TYLENOL) 500 MG tablet Take 500 mg by mouth every 6 (six) hours as needed.     Cetirizine HCl (ZYRTEC ALLERGY PO) Take by mouth.     cholecalciferol (VITAMIN D3) 25 MCG (1000 UNIT) tablet Take by mouth daily.     doxycycline (VIBRA-TABS) 100 MG tablet Take 1 tablet (100 mg total) by mouth 2 (two) times daily for 10 days. 20 tablet 0   FOLIC ACID PO  Take by mouth.     ibuprofen (ADVIL) 800 MG tablet Take 1 tablet (800 mg total) by mouth every 8 (eight) hours as needed. 30 tablet 0   iron polysaccharides (NIFEREX) 150 MG capsule Take 1 capsule (150 mg total) by mouth 2 (two) times daily. 180 capsule 0   Magnesium 200 MG TABS Take 250 mg by mouth daily.     Multiple Vitamin (MULTIVITAMIN) tablet Take 1 tablet by mouth daily.     Multiple Vitamins-Minerals (ZINC PO) Take by mouth daily in the afternoon.     tranexamic acid (LYSTEDA) 650 MG TABS tablet Take 2 tablets (1,300 mg total) by mouth 3 (three) times daily. 90 tablet 4   VITAMIN E PO Take 1 capsule by mouth daily.     No current facility-administered medications on file prior to visit.   ROS as in subjective   Objective: BP 110/60   Pulse 64   Wt 232 lb 9.6 oz (105.5 kg)   BMI 41.20 kg/m   General appearence: alert, no distress, WD/WN, seated in dark room HEENT: normocephalic, sclerae anicteric, TMs pearly, nares patent, no discharge or erythema, pharynx normal Oral cavity: MMM, no lesions Neck: supple, no lymphadenopathy, no thyromegaly, no masses, no bruits Heart: RRR, normal S1, S2, no murmurs Lungs: CTA bilaterally,  no wheezes, rhonchi, or rales Pulses: 2+ symmetric, upper and lower extremities, normal cap refill Neuro: cn2-12 intact, nonfocal exam     Assessment: Encounter Diagnoses  Name Primary?   Other migraine with status migrainosus, intractable Yes   Iron deficiency    Anemia, unspecified type    Allergic rhinitis due to pollen, unspecified seasonality    Vitamin D deficiency    Excessive bleeding in premenopausal period      Plan: Migraines Begin trial of Nurtec 75mg  to break the current migraine This medicaiton can be used every other day if several migraines monthly Let me know if this works and I can send month supply   Iron deficiency anemia She continues on iron oral twice daily Labs today to recheck since iron IV in late March at the  emergency dept  Allergic rhinitis Continue allergy medication tablet in the evening Consider adding Flonase nasal daily in the morning Continue nasal saline flush daily  Heavy periods Follow up with gynecology as planned  Vitamin D deficiency Continue vitamin D supplement such as 1000 units OTC daily   Jacqueline Winters was seen today for 1 month follow-up.  Diagnoses and all orders for this visit:  Other migraine with status migrainosus, intractable  Iron deficiency -     CBC with Differential/Platelet -     Iron, TIBC and Ferritin Panel  Anemia, unspecified type -     CBC with Differential/Platelet -     Iron, TIBC and Ferritin Panel  Allergic rhinitis due to pollen, unspecified seasonality  Vitamin D deficiency  Excessive bleeding in premenopausal period    F/u 68mo

## 2022-08-09 LAB — IRON,TIBC AND FERRITIN PANEL
Ferritin: 37 ng/mL (ref 15–150)
Iron Saturation: 16 % (ref 15–55)
Iron: 54 ug/dL (ref 27–159)
Total Iron Binding Capacity: 328 ug/dL (ref 250–450)
UIBC: 274 ug/dL (ref 131–425)

## 2022-08-09 LAB — CBC WITH DIFFERENTIAL/PLATELET
Basophils Absolute: 0 10*3/uL (ref 0.0–0.2)
Basos: 0 %
EOS (ABSOLUTE): 0.1 10*3/uL (ref 0.0–0.4)
Eos: 1 %
Hematocrit: 39 % (ref 34.0–46.6)
Hemoglobin: 12.5 g/dL (ref 11.1–15.9)
Immature Grans (Abs): 0 10*3/uL (ref 0.0–0.1)
Immature Granulocytes: 0 %
Lymphocytes Absolute: 2.2 10*3/uL (ref 0.7–3.1)
Lymphs: 28 %
MCH: 28.5 pg (ref 26.6–33.0)
MCHC: 32.1 g/dL (ref 31.5–35.7)
MCV: 89 fL (ref 79–97)
Monocytes Absolute: 0.5 10*3/uL (ref 0.1–0.9)
Monocytes: 6 %
Neutrophils Absolute: 5 10*3/uL (ref 1.4–7.0)
Neutrophils: 65 %
Platelets: 331 10*3/uL (ref 150–450)
RBC: 4.38 x10E6/uL (ref 3.77–5.28)
RDW: 14.9 % (ref 11.7–15.4)
WBC: 7.7 10*3/uL (ref 3.4–10.8)

## 2022-08-09 NOTE — Progress Notes (Signed)
Results sent through MyChart

## 2022-09-06 ENCOUNTER — Ambulatory Visit: Payer: 59 | Admitting: Medical

## 2022-09-06 ENCOUNTER — Encounter: Payer: Self-pay | Admitting: Medical

## 2022-09-06 VITALS — BP 130/76 | HR 70 | Ht 64.0 in | Wt 236.6 lb

## 2022-09-06 DIAGNOSIS — R7301 Impaired fasting glucose: Secondary | ICD-10-CM | POA: Diagnosis not present

## 2022-09-06 DIAGNOSIS — E049 Nontoxic goiter, unspecified: Secondary | ICD-10-CM

## 2022-09-06 DIAGNOSIS — R5383 Other fatigue: Secondary | ICD-10-CM | POA: Diagnosis not present

## 2022-09-06 DIAGNOSIS — J301 Allergic rhinitis due to pollen: Secondary | ICD-10-CM

## 2022-09-06 DIAGNOSIS — D649 Anemia, unspecified: Secondary | ICD-10-CM

## 2022-09-06 DIAGNOSIS — L659 Nonscarring hair loss, unspecified: Secondary | ICD-10-CM

## 2022-09-06 NOTE — Assessment & Plan Note (Signed)
Continue with efforts at healthy diet and continue to gradually increase exercise

## 2022-09-06 NOTE — Assessment & Plan Note (Signed)
Stable, updated labs today

## 2022-09-06 NOTE — Assessment & Plan Note (Signed)
Continue Niferex BID, labs stable a month ago

## 2022-09-06 NOTE — Assessment & Plan Note (Signed)
Consider changing oral tablet to allergan for the next month, can add flonase OTC in the mornings

## 2022-09-06 NOTE — Progress Notes (Signed)
Acute Office Visit  Subjective:     Patient ID: Jacqueline Winters, female    DOB: [birth year], 42 y.o.   MRN: [id number]  Chief Complaint  Patient presents with   Follow-up    Patient is here for recheck. She is asking for labs. Wants TSH, iron and A1c.    HPI Patient is in today for labs, concerns.   Hx/o impaired glucose, wants updated labs.  Wants to lose weight now that energy is improving.   Been exercising with zumba and walking, stairs at work, more activity.   Has hx/o goiter, wants updated thyroid labs  Hx/o anemia, wants updated labs.  Taking iron niferex BID, takes multivitamin and other supplemebts as well  Has allergies, but zyrtec doesn't seem to help all that much.  Lately symptoms are more nasal draiange, stuffy, but no headaches.  Sometimes watery eyes, some sneezing.   ROS as in subjective   Past Medical History:  Diagnosis Date   Allergy    GERD (gastroesophageal reflux disease)    with pregnancy   Goiter    Headache(784.0)    Heart murmur    Hidradenitis axillaris    Impaired fasting blood sugar    Insomnia    Obesity    PONV (postoperative nausea and vomiting)    Thyroid nodule    biopsy 07/2013, consult with Dr. Corwin Levins endocrinology   Current Outpatient Medications on File Prior to Visit  Medication Sig Dispense Refill   cholecalciferol (VITAMIN D3) 25 MCG (1000 UNIT) tablet Take by mouth daily.     FOLIC ACID PO Take by mouth.     iron polysaccharides (NIFEREX) 150 MG capsule Take 1 capsule (150 mg total) by mouth 2 (two) times daily. 180 capsule 0   Magnesium 200 MG TABS Take 250 mg by mouth daily.     Multiple Vitamin (MULTIVITAMIN) tablet Take 1 tablet by mouth daily.     Multiple Vitamins-Minerals (ZINC PO) Take by mouth daily in the afternoon.     VITAMIN E PO Take 1 capsule by mouth daily.     acetaminophen (TYLENOL) 500 MG tablet Take 500 mg by mouth every 6 (six) hours as needed. (Patient not taking: Reported on 09/06/2022)      Cetirizine HCl (ZYRTEC ALLERGY PO) Take by mouth. (Patient not taking: Reported on 09/06/2022)     ibuprofen (ADVIL) 800 MG tablet Take 1 tablet (800 mg total) by mouth every 8 (eight) hours as needed. (Patient not taking: Reported on 09/06/2022) 30 tablet 0   Rimegepant Sulfate (NURTEC) 75 MG TBDP Take 1 tablet (75 mg total) by mouth daily as needed. (Patient not taking: Reported on 09/06/2022) 2 tablet 0   tranexamic acid (LYSTEDA) 650 MG TABS tablet Take 2 tablets (1,300 mg total) by mouth 3 (three) times daily. (Patient not taking: Reported on 09/06/2022) 90 tablet 4   No current facility-administered medications on file prior to visit.       Objective:    BP 130/76   Pulse 70   Ht 5\' 4"  (1.626 m)   Wt 236 lb 9.6 oz (107.3 kg)   LMP 08/29/2022 (Exact Date)   SpO2 98%   BMI 40.61 kg/m   Wt Readings from Last 3 Encounters:  09/06/22 236 lb 9.6 oz (107.3 kg)  08/08/22 232 lb 9.6 oz (105.5 kg)  07/05/22 228 lb 3.2 oz (103.5 kg)    Physical Exam Vitals reviewed.  Constitutional:      General: She is not in  acute distress.    Appearance: Normal appearance. She is not ill-appearing.  HENT:     Head: Normocephalic and atraumatic.     Right Ear: Tympanic membrane, ear canal and external ear normal.     Left Ear: Tympanic membrane, ear canal and external ear normal.     Nose: Nose normal. No congestion or rhinorrhea.     Mouth/Throat:     Mouth: Mucous membranes are moist.     Pharynx: Oropharynx is clear. No oropharyngeal exudate or posterior oropharyngeal erythema.  Eyes:     Conjunctiva/sclera: Conjunctivae normal.  Cardiovascular:     Rate and Rhythm: Normal rate and regular rhythm.     Pulses: Normal pulses.     Heart sounds: Normal heart sounds.  Pulmonary:     Effort: Pulmonary effort is normal. No respiratory distress.     Breath sounds: Normal breath sounds. No wheezing, rhonchi or rales.  Musculoskeletal:     Cervical back: Neck supple. No rigidity or tenderness.   Lymphadenopathy:     Cervical: No cervical adenopathy.  Skin:    General: Skin is warm.  Neurological:     Mental Status: She is alert.        Assessment & Plan:   Encounter Diagnoses  Name Primary?   Anemia, unspecified type Yes   Impaired fasting blood sugar    Other fatigue    Thyroid goiter    Hair loss    Allergic rhinitis due to pollen, unspecified seasonality     Problem List Items Addressed This Visit     Thyroid goiter    Stable, updated labs today      Relevant Orders   TSH   Impaired fasting blood sugar    Continue with efforts at healthy diet and continue to gradually increase exercise      Relevant Orders   Hemoglobin A1c   Rhinitis, allergic    Consider changing oral tablet to allergan for the next month, can add flonase OTC in the mornings      Anemia - Primary    Continue Niferex BID, labs stable a month ago      Other Visit Diagnoses     Other fatigue       Relevant Orders   TSH   Hair loss       Relevant Orders   TSH       No orders of the defined types were placed in this encounter.   No follow-ups on file.  Kristian Covey, PA-C

## 2022-09-07 LAB — HEMOGLOBIN A1C
Est. average glucose Bld gHb Est-mCnc: 117 mg/dL
Hgb A1c MFr Bld: 5.7 % — ABNORMAL HIGH (ref 4.8–5.6)

## 2022-09-07 LAB — TSH: TSH: 0.617 u[IU]/mL (ref 0.450–4.500)

## 2022-09-07 NOTE — Progress Notes (Signed)
Results sent through MyChart

## 2022-09-14 ENCOUNTER — Ambulatory Visit: Payer: 59 | Admitting: Podiatry

## 2022-09-14 ENCOUNTER — Ambulatory Visit (INDEPENDENT_AMBULATORY_CARE_PROVIDER_SITE_OTHER): Payer: 59

## 2022-09-14 DIAGNOSIS — M21612 Bunion of left foot: Secondary | ICD-10-CM | POA: Diagnosis not present

## 2022-09-14 DIAGNOSIS — M21611 Bunion of right foot: Secondary | ICD-10-CM

## 2022-09-21 NOTE — Progress Notes (Signed)
Chief Complaint  Patient presents with   Bunions    Patient came in today for bilateral bunions and corns on toes 1-5, right foot is worse that en the left, rate of pain 4 out of 10     Subjective: 42 y.o. female presents today as a new patient for evaluation of minimally symptomatic bunions to bilateral feet.  Ongoing for several years.  Patient states that she does experience some pain and tenderness associated to certain shoes that she wears.  Presenting for further treatment and evaluation  Past Medical History:  Diagnosis Date   Allergy    GERD (gastroesophageal reflux disease)    with pregnancy   Goiter    Headache(784.0)    Heart murmur    Hidradenitis axillaris    Impaired fasting blood sugar    Insomnia    Obesity    PONV (postoperative nausea and vomiting)    Thyroid nodule    biopsy 07/2013, consult with Dr. Corwin Levins endocrinology    Past Surgical History:  Procedure Laterality Date   CESAREAN SECTION  12/21/2011   Procedure: CESAREAN SECTION;  Surgeon: Freddrick March. Tenny Craw, MD;  Location: [facility];  Service: Obstetrics;  Laterality: N/A;  Bilateral Tubal Ligation   CHOLECYSTECTOMY  2009   HYDRADENITIS EXCISION Right 09/14/2021   Procedure: EXCISION HIDRADENITIS AXILLA WITH COMPLEX CLOSURE;  Surgeon: Allena Napoleon, MD;  Location: [facility];  Service: Plastics;  Laterality: Right;  45 mins   INCISION AND DRAINAGE     right axillary boils   PILONIDAL CYST / SINUS EXCISION  2008   [facility]   TUBAL LIGATION     VAGINAL DELIVERY  2002, 1998    Allergies  Allergen Reactions   Hydrocodone-Acetaminophen Nausea Only   Vicodin [Hydrocodone-Acetaminophen]     uneasy     Objective: Physical Exam General: The patient is alert and oriented x3 in no acute distress.  Dermatology: Skin is cool, dry and supple bilateral lower extremities. Negative for open lesions or macerations.  Vascular: Palpable pedal pulses bilaterally. No edema or  erythema noted. Capillary refill within normal limits.  Neurological: Epicritic and protective threshold grossly intact bilaterally.   Musculoskeletal Exam: Clinical evidence of bunion deformity noted to the respective foot. There is moderate pain on palpation range of motion of the first MPJ. Lateral deviation of the hallux noted consistent with hallux abductovalgus.  Radiographic Exam: Normal osseous mineralization.  No acute fractures identified.  Increased intermetatarsal angle greater than 15 with a hallux abductus angle greater than 30 noted on AP view.   Assessment: 1.  Hallux valgus bilateral; minimally symptomatic   Plan of Care:  -Patient was evaluated. X-Rays reviewed. -Since the bunion deformities are minimally symptomatic I do recommend conservative treatment.  I would not recommend surgery for the moment since they are not affecting her on a daily basis and it is not affecting her quality life and she is able to carry about full activities. -Recommend wide fitting shoes that do not irritate or constrict the toebox area. -Return to clinic as needed      Felecia Shelling, DPM [facility]  Dr. Felecia Shelling, DPM    [address] [place], [place] [address]  Office [phone]  Fax [phone]

## 2022-09-27 DIAGNOSIS — D259 Leiomyoma of uterus, unspecified: Secondary | ICD-10-CM | POA: Insufficient documentation

## 2022-10-05 ENCOUNTER — Other Ambulatory Visit: Payer: Self-pay | Admitting: Obstetrics and Gynecology

## 2022-10-08 ENCOUNTER — Other Ambulatory Visit: Payer: Self-pay | Admitting: Medical

## 2022-10-08 MED ORDER — IBUPROFEN 800 MG PO TABS
800.0000 mg | ORAL_TABLET | Freq: Three times a day (TID) | ORAL | 0 refills | Status: DC | PRN
  Filled 2022-10-08: qty 30, 10d supply, fill #0

## 2022-10-10 ENCOUNTER — Other Ambulatory Visit (HOSPITAL_COMMUNITY): Payer: Self-pay

## 2022-10-10 ENCOUNTER — Other Ambulatory Visit: Payer: Self-pay

## 2022-10-11 ENCOUNTER — Other Ambulatory Visit (HOSPITAL_COMMUNITY): Payer: Self-pay

## 2022-10-11 MED ORDER — NORELGESTROMIN-ETH ESTRADIOL 150-35 MCG/24HR TD PTWK
1.0000 | MEDICATED_PATCH | TRANSDERMAL | 5 refills | Status: DC
  Filled 2022-10-11: qty 3, 28d supply, fill #0

## 2022-10-19 ENCOUNTER — Other Ambulatory Visit (HOSPITAL_COMMUNITY): Payer: Self-pay

## 2022-10-19 ENCOUNTER — Ambulatory Visit: Payer: 59 | Admitting: Medical

## 2022-10-19 VITALS — BP 104/68 | HR 72 | Temp 97.9°F | Resp 16 | Wt 241.4 lb

## 2022-10-19 DIAGNOSIS — J029 Acute pharyngitis, unspecified: Secondary | ICD-10-CM

## 2022-10-19 DIAGNOSIS — R0982 Postnasal drip: Secondary | ICD-10-CM | POA: Diagnosis not present

## 2022-10-19 DIAGNOSIS — E049 Nontoxic goiter, unspecified: Secondary | ICD-10-CM | POA: Diagnosis not present

## 2022-10-19 MED ORDER — AMOXICILLIN 875 MG PO TABS
875.0000 mg | ORAL_TABLET | Freq: Two times a day (BID) | ORAL | 0 refills | Status: AC
  Filled 2022-10-19: qty 20, 10d supply, fill #0

## 2022-10-19 MED ORDER — PSEUDOEPHEDRINE HCL 30 MG PO TABS
30.0000 mg | ORAL_TABLET | Freq: Three times a day (TID) | ORAL | 0 refills | Status: DC | PRN
  Filled 2022-10-19: qty 30, 10d supply, fill #0

## 2022-10-19 NOTE — Progress Notes (Signed)
Subjective:  Jacqueline Winters is a 42 y.o. female who presents for Chief Complaint  Patient presents with   swollen throat- x 1 week    Swollen throat x 1 week, irriatied in the back of throat, coughing x 2 days. Would like an U/S for possible thyroid     Here for throat concerns.  Feels some swelling in throat x 1 week, some nasal irritation, some bad taste in mouth.  Has been using saline flush.  Coughing some the last 2 days.  No fever, no body aches or chills, no strep contacts.   Using some ibuprofen.  No colored mucous.  No ear pain.  No productive cough.  No wheezing or shortness of breath.  She is concerned about her goiter and thyroid.  Wants to repeat ultrasound.  She feels fullness in the neck.  No other aggravating or relieving factors.    No other c/o.  The following portions of the patient's history were reviewed and updated as appropriate: allergies, current medications, past family history, past medical history, past social history, past surgical history and problem list.  ROS Otherwise as in subjective above    Objective: BP 104/68   Pulse 72   Temp 97.9 F (36.6 C)   Resp 16   Wt 241 lb 6.4 oz (109.5 kg)   BMI 41.44 kg/m   General appearance: alert, no distress, well developed, well nourished HEENT: normocephalic, sclerae anicteric, conjunctiva pink and moist, TMs pearly, nares patent, some mild mucoid discharge or erythema, postnasal drainage present but no major erythema of pharynx  Oral cavity: MMM, no lesions Neck: supple, no lymphadenopathy, moderate goiter, no distinct nodule, nontender, no masses Heart: RRR, normal S1, S2, no murmurs   Assessment: Encounter Diagnoses  Name Primary?   Thyroid goiter Yes   Sore throat    Post-nasal drainage      Plan: Thyroid goiter-last ultrasound 2022.  I will go ahead and place an order for an updated surveillance ultrasound.  Sore throat and nasal drainage-no obvious significant bacterial infection  currently.  She has had symptoms about a week though.  Begin Sudafed twice daily or up to 3 times daily for the next few days, good water intake, do salt water gargles a couple times a day if you are not doing this.  If not seeing any improvement within 3 days then you can begin the amoxicillin antibiotic.  Keep in mind that sometimes irritated throat or foul taste in mouth can be associated with reflux.  If you have belching or burping or feel bad taste after eating then consider over-the-counter Prilosec or Pepcid for the next week once daily  [name] was seen today for swollen throat- x 1 week.  Diagnoses and all orders for this visit:  Thyroid goiter -     US THYROID; Future  Sore throat  Post-nasal drainage  Other orders -     pseudoephedrine (SUDAFED) 30 MG tablet; Take 1 tablet (30 mg total) by mouth every 8 (eight) hours as needed for congestion. -     amoxicillin (AMOXIL) 875 MG tablet; Take 1 tablet (875 mg total) by mouth 2 (two) times daily for 10 days.    Follow up: Pending ultrasound

## 2022-10-19 NOTE — Patient Instructions (Signed)
Thyroid goiter-last ultrasound 2022.  I will go ahead and place an order for an updated surveillance ultrasound.  Sore throat and nasal drainage-no obvious significant bacterial infection currently.  She has had symptoms about a week though.  Begin Sudafed twice daily or up to 3 times daily for the next few days, good water intake, do salt water gargles a couple times a day if you are not doing this.  If not seeing any improvement within 3 days then you can begin the amoxicillin antibiotic.  Keep in mind that sometimes irritated throat or foul taste in mouth can be associated with reflux.  If you have belching or burping or feel bad taste after eating then consider over-the-counter Prilosec or Pepcid for the next week once daily

## 2022-10-20 ENCOUNTER — Other Ambulatory Visit: Payer: 59

## 2022-10-20 ENCOUNTER — Ambulatory Visit
Admission: RE | Admit: 2022-10-20 | Discharge: 2022-10-20 | Disposition: A | Discharge status: Home / Self Care | Payer: 59 | Source: Ambulatory Visit | Attending: Medical | Admitting: Medical

## 2022-10-20 DIAGNOSIS — E049 Nontoxic goiter, unspecified: Secondary | ICD-10-CM

## 2022-10-24 NOTE — Progress Notes (Signed)
 Results sent through MyChart

## 2022-11-13 ENCOUNTER — Other Ambulatory Visit: Payer: Self-pay | Admitting: Medical

## 2022-11-14 ENCOUNTER — Other Ambulatory Visit (HOSPITAL_COMMUNITY): Payer: Self-pay

## 2022-11-14 MED ORDER — IBUPROFEN 800 MG PO TABS
800.0000 mg | ORAL_TABLET | Freq: Three times a day (TID) | ORAL | 0 refills | Status: DC | PRN
  Filled 2022-11-14: qty 30, 10d supply, fill #0

## 2022-11-18 LAB — HM MAMMOGRAPHY

## 2022-11-23 ENCOUNTER — Encounter: Payer: Self-pay | Admitting: Internal Medicine

## 2022-12-29 ENCOUNTER — Other Ambulatory Visit: Payer: Self-pay | Admitting: Medical

## 2022-12-30 ENCOUNTER — Other Ambulatory Visit (HOSPITAL_COMMUNITY): Payer: Self-pay

## 2022-12-30 MED ORDER — IBUPROFEN 800 MG PO TABS
800.0000 mg | ORAL_TABLET | Freq: Three times a day (TID) | ORAL | 0 refills | Status: DC | PRN
  Filled 2022-12-30 – 2023-01-12 (×2): qty 30, 10d supply, fill #0

## 2023-01-11 ENCOUNTER — Other Ambulatory Visit (HOSPITAL_COMMUNITY): Payer: Self-pay

## 2023-01-12 ENCOUNTER — Other Ambulatory Visit (HOSPITAL_COMMUNITY): Payer: Self-pay

## 2023-02-04 ENCOUNTER — Telehealth: Payer: 59 | Admitting: Family Medicine

## 2023-02-04 DIAGNOSIS — M542 Cervicalgia: Secondary | ICD-10-CM | POA: Diagnosis not present

## 2023-02-04 DIAGNOSIS — Z87828 Personal history of other (healed) physical injury and trauma: Secondary | ICD-10-CM

## 2023-02-04 MED ORDER — CYCLOBENZAPRINE HCL 10 MG PO TABS: 10.0000 mg | ORAL_TABLET | Freq: Three times a day (TID) | ORAL | 0 refills | Status: DC | PRN

## 2023-02-04 MED ORDER — NAPROXEN 500 MG PO TABS: 500.0000 mg | ORAL_TABLET | Freq: Two times a day (BID) | ORAL | 0 refills | Status: AC

## 2023-02-04 NOTE — Patient Instructions (Signed)
Cervical Sprain A cervical sprain is a stretch or tear in one or more of the ligaments in the neck. Ligaments are the tissues that connect bones to each other. Cervical sprains can range from mild to severe. Severe cervical sprains can cause the spinal bones (vertebrae) in the neck to be unstable. This can result in spinal cord damage and serious nervous system problems. Healing time for a cervical sprain depends on the cause and extent of the injury. Most cervical sprains heal in 4-6 weeks. What are the causes? Cervical sprains may be caused by trauma, such as an injury from a motor vehicle accident, a fall, or a sudden forward and backward whipping movement of the head and neck (whiplash injury). Mild cervical sprains may be caused by wear and tear over time. What increases the risk? You are more likely to get a cervical sprain if: You take part in activities that have a high risk of trauma to the neck. These include contact sports, gymnastics, and diving. You have: Osteoarthritis of the spine. Poor strength and flexibility of the neck. Poor posture. You have had a neck injury in the past. You spend long periods in positions that put stress on the neck, such as sitting at a computer. What are the signs or symptoms? Symptoms of this condition include: Any of these problems in the neck, shoulders, or upper back: Pain or tenderness. Stiffness. Swelling. A burning feeling. Sudden tightening of neck muscles (spasms). Limited ability to move the neck. Headache. Dizziness. Nausea or vomiting. Weakness, numbness, or tingling in a hand or an arm. Symptoms may develop right away after injury or may develop over a few days. In some cases, symptoms may go away with treatment and return (recur) over time. How is this diagnosed? This condition may be diagnosed based on: Your symptoms, medical history, and a physical exam. Any recent injuries or known neck problems that you have, such as arthritis  in the neck. Imaging tests, such as X-rays, an MRI, or a CT scan. How is this treated? This condition is treated by resting and icing the injured area and doing physical therapy exercises to improve movement and strength. Heat therapy may be used 2-3 days after the injury if there is no swelling. Depending on the severity of your condition, treatment may also include: Keeping your neck in place (immobilized) for periods of time. This may be done using: A cervical collar. This supports your chin and the back of your head. A cervical traction device. This is a sling that holds up your head. It removes weight and pressure from your neck. Medicines for pain or other symptoms. Surgery. This is rare. Follow these instructions at home: Medicines Take over-the-counter and prescription medicines only as told by your health care provider. Ask your provider if the medicine prescribed to you: Requires you to avoid driving or using machinery. Can cause constipation. You may need to take these actions to prevent or treat constipation: Drink enough fluid to keep your pee pale yellow. Take over-the-counter or prescription medicines. Eat foods that are high in fiber, such as beans, whole grains, and fresh fruits and vegetables. Limit foods that are high in fat and processed sugars, such as fried or sweet foods. If you have a cervical collar: Wear the collar as told by your provider. Do not remove it unless told. Ask before making any adjustments to your collar. If you have long hair, keep it outside of the collar. If you are allowed to remove the   collar for cleaning and bathing: Follow instructions about how to remove it safely. Clean it by hand with mild soap and water and air-dry it completely. If your collar has removable pads, remove them every 1-2 days and wash them by hand with soap and water. Let them air-dry completely before putting them back in the collar. Tell your provider if your skin under  the collar has irritation or sores. Managing pain, stiffness, and swelling     Use a cervical traction device as told. If told, put ice on the affected area. Put ice in a plastic bag. Place a towel between your skin and the bag. Leave the ice on for 20 minutes, 2-3 times a day. If told, apply heat to the affected area before you exercise or as often as told by your provider. Use the heat source that your provider recommends, such as a moist heat pack or a heating pad. Place a towel between your skin and the heat source. Leave the heat on for 20-30 minutes. If your skin turns bright red, remove the ice or heat right away to prevent skin damage. The risk of damage is higher if you cannot feel pain, heat, or cold. Activity Do not drive while wearing a cervical collar. If you do not have a cervical collar, ask if it is safe to drive while your neck heals. Do not lift anything that is heavier than 10 lb (4.5 kg) until your provider says that it is safe. Rest as told by your provider. Avoid positions and activities that make your symptoms worse. Do physical therapy exercises as told by your provider or physical therapist. Return to your normal activities as told by your provider. Ask your provider what activities are safe for you. General instructions Do not use any products that contain nicotine or tobacco. These products include cigarettes, chewing tobacco, and vaping devices, such as e-cigarettes. These can delay healing. If you need help quitting, ask your provider. Keep all follow-up visits. Your provider will monitor your injury and activity level. How is this prevented? To prevent a cervical sprain from happening again: Use and maintain good posture. Make any needed adjustments to your workstation to help you do this. Exercise regularly as told by your provider or physical therapist. Avoid risky activities that may cause a cervical sprain. Contact a health care provider if: You have  symptoms that get worse or do not get better after 2 weeks of treatment. You have new symptoms. Your pain gets worse or does not get better with medicine. You have sores or irritated skin on your neck from wearing your cervical collar. Get help right away if: You have severe pain. You develop numbness, tingling, or weakness in any part of your body. You cannot move a part of your body (you have paralysis). You have neck pain along with severe dizziness or headache. This information is not intended to replace advice given to you by your health care provider. Make sure you discuss any questions you have with your health care provider. Document Revised: 10/29/2021 Document Reviewed: 10/29/2021 Elsevier Patient Education  2024 Elsevier Inc.  

## 2023-02-04 NOTE — Progress Notes (Signed)
Virtual Visit Consent   Jacqueline Winters, you are scheduled for a virtual visit with a [facility] provider today. Just as with appointments in the office, your consent must be obtained to participate. Your consent will be active for this visit and any virtual visit you may have with one of our providers in the next 365 days. If you have a MyChart account, a copy of this consent can be sent to you electronically.  As this is a virtual visit, video technology does not allow for your provider to perform a traditional examination. This may limit your provider's ability to fully assess your condition. If your provider identifies any concerns that need to be evaluated in person or the need to arrange testing (such as labs, EKG, etc.), we will make arrangements to do so. Although advances in technology are sophisticated, we cannot ensure that it will always work on either your end or our end. If the connection with a video visit is poor, the visit may have to be switched to a telephone visit. With either a video or telephone visit, we are not always able to ensure that we have a secure connection.  By engaging in this virtual visit, you consent to the provision of healthcare and authorize for your insurance to be billed (if applicable) for the services provided during this visit. Depending on your insurance coverage, you may receive a charge related to this service.  I need to obtain your verbal consent now. Are you willing to proceed with your visit today? Jacqueline Winters has provided verbal consent on 02/04/2023 for a virtual visit (video or telephone). Georgana Curio, FNP  Date: 02/04/2023 11:36 AM  Virtual Visit via Video Note   I, Georgana Curio, connected with  Jacqueline Winters  ([id number], 29-Oct-1980) on 02/04/23 at 11:30 AM EDT by a video-enabled telemedicine application and verified that I am speaking with the correct person using two identifiers.  Location: Patient: Virtual Visit Location Patient:  Home Provider: Virtual Visit Location Provider: Home Office   I discussed the limitations of evaluation and management by telemedicine and the availability of in person appointments. The patient expressed understanding and agreed to proceed.    History of Present Illness: Jacqueline Winters is a 42 y.o. who identifies as a female who was assigned female at birth, and is being seen today for rt sided neck pain over rt trap into rt shoulder. 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 She has seen a chiropractor this week. Soma doesn't help. 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   Encounter for health maintenance examination in adult 02/03/2020   Screening for lipid disorders 02/03/2020   Family history of cancer 02/03/2020   Screening for condition 02/03/2020   Vitamin D deficiency 03/13/2018   Screening for cervical cancer 11/27/2017   Insomnia 07/30/2015   Screening for diabetes mellitus 07/30/2015   Rhinitis, allergic 07/30/2015   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 Take with food, Disp: 60 tablet, Rfl: 0   acetaminophen (TYLENOL) 500 MG tablet, Take 500 mg by mouth every 6 (six) hours as  needed., Disp: , Rfl:    Cetirizine HCl (ZYRTEC ALLERGY PO), Take by mouth., Disp: , Rfl:    cholecalciferol (VITAMIN D3) 25 MCG (1000 UNIT) tablet, Take by mouth daily., Disp: , Rfl:    FOLIC ACID PO, Take by mouth., Disp: , Rfl:    ibuprofen (ADVIL) 800 MG tablet, Take 1 tablet (800 mg total) by mouth every 8 (eight) hours as needed., Disp: 30 tablet, Rfl: 0   iron polysaccharides (NIFEREX) 150 MG capsule, Take 1 capsule (150 mg total) by mouth 2 (two) times daily., Disp: 180 capsule, Rfl: 0   Magnesium 200 MG TABS, Take 250 mg by mouth daily., Disp: , Rfl:    Multiple Vitamin (MULTIVITAMIN) tablet, Take 1 tablet by mouth daily., Disp: , Rfl:    Multiple Vitamins-Minerals (ZINC PO), Take by mouth daily in the afternoon., Disp: , Rfl:    norelgestromin-ethinyl estradiol [employer]r [employer]ico) 150-35 MCG/24HR transdermal patch, Place 1 patch onto the skin once a week. (Patient not taking: Reported on 10/19/2022), Disp: 3 patch, Rfl: 5   pseudoephedrine (SUDAFED) 30 MG tablet, Take 1 tablet (30 mg total) by mouth every 8 (eight) hours as needed for congestion., Disp: 30 tablet, Rfl: 0   Rimegepant Sulfate (NURTEC) 75 MG TBDP, Take 1 tablet (75 mg total) by mouth daily as needed., Disp: 2 tablet, Rfl: 0   tranexamic acid (LYSTEDA) 650 MG TABS tablet, Take 2 tablets (1,300 mg total) by mouth 3 (three) times daily., Disp: 90 tablet, Rfl: 4   VITAMIN E PO, Take 1 capsule by mouth daily., Disp: , Rfl:   Observations/Objective: Patient is well-developed, well-nourished in no acute distress.  Resting comfortably  at home.  Head is normocephalic, atraumatic.  No labored breathing.  Speech is clear and coherent with logical content.  Patient is alert and oriented at baseline.    Assessment and Plan: 1. Neck pain  2. History of neck injury  Heat, follow up with pcp or uc if sx worsen, med use and side effects discussed,   Follow Up Instructions: I discussed the assessment and treatment plan with the patient.  The patient was provided an opportunity to ask questions and all were answered. The patient agreed with the plan and demonstrated an understanding of the instructions.  A copy of instructions were sent to the patient via MyChart unless otherwise noted below.     The patient was advised to call back or seek an in-person evaluation if the symptoms worsen or if the condition fails to improve as anticipated.    Georgana Curio, FNP

## 2023-02-07 ENCOUNTER — Ambulatory Visit: Payer: 59 | Admitting: Medical

## 2023-02-07 ENCOUNTER — Other Ambulatory Visit (HOSPITAL_COMMUNITY): Payer: Self-pay

## 2023-02-07 VITALS — BP 120/76 | HR 67 | Temp 97.6°F | Wt 233.4 lb

## 2023-02-07 DIAGNOSIS — M792 Neuralgia and neuritis, unspecified: Secondary | ICD-10-CM | POA: Diagnosis not present

## 2023-02-07 DIAGNOSIS — M542 Cervicalgia: Secondary | ICD-10-CM | POA: Diagnosis not present

## 2023-02-07 MED ORDER — GABAPENTIN 300 MG PO CAPS
300.0000 mg | ORAL_CAPSULE | Freq: Two times a day (BID) | ORAL | 0 refills | Status: DC
  Filled 2023-02-07: qty 45, 23d supply, fill #0

## 2023-02-07 NOTE — Progress Notes (Signed)
Subjective:  Jacqueline Winters is a 42 y.o. female who presents for Chief Complaint  Patient presents with   right neck and shoulder pain    Right neck and shoulder pain x 8 days. Friday chiropractic session really flared it up. Nagging pain and then sharp burning pain radiates down to shoulder blade down to elbow. No pain medication is helping. Pain level 8/10. Would like a CT with contrast     Here for neck pain.  She did a virtual visit on 02/04/2023 with another office for the same.  Was prescribed Naprosyn and Flexeril for the symptoms.  They are not helping all that much  She notes neck pain on the right, shoulder and upper back pain, pain in comes around the lateral side of the right arm that is a burning pain.  She feels a pinching pain in her right neck and arm  She notes some ongoing neck pains for months.  She does see a chiropractor fairly regularly.  She saw them last week twice but the second visit seem to flare things up.  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  No recent trauma injury or fall  She did do a fit camp exercise camp 2 weeks ago but does not recall a specific injury  No other aggravating or relieving factors.    No other c/o.    The following portions of the patient's history were reviewed and updated as appropriate: allergies, current medications, past family history, past medical history, past social history, past surgical history and problem list.  ROS Otherwise as in subjective above    Objective: BP 120/76   Pulse 67   Temp 97.6 F (36.4 C)   Wt 233 lb 6.4 oz (105.9 kg)   BMI 40.06 kg/m   General appearance: alert, no distress, well developed, well nourished Tender in the posterior neck, otherwise neck nontender to palpation, range of motion is limited due to pain and worse pain with range of motion to the right Tender right upper back paraspinal region Arms nontender with normal range of motion no  obvious deformity Arms neurovascularly intact No redness swelling or deformity    Assessment: Encounter Diagnoses  Name Primary?   Cervicalgia Yes   Radicular pain in right arm      Plan: We discussed symptoms and concerns and possible etiology.  Given that she is already been doing chiropractic therapy and had some chronic issues with worsening flareup recently we will pursue MRI  She does not tolerate prednisone or narcotics that well  Continue Naprosyn and muscle laxer prescribed 2 days ago, begin gabapentin which she has taken in the past.  Start gabapentin 3 mg daily for the first couple days then go to twice daily  Work note written  Jacqueline Winters was seen today for right neck and shoulder pain.  Diagnoses and all orders for this visit:  Cervicalgia -     MR Cervical Spine Wo Contrast; Future  Radicular pain in right arm  Other orders -     gabapentin (NEURONTIN) 300 MG capsule; Take 1 capsule (300 mg total) by mouth 2 (two) times daily.    Follow up: pending MRI

## 2023-02-08 ENCOUNTER — Other Ambulatory Visit: Payer: 59

## 2023-02-09 ENCOUNTER — Ambulatory Visit: Payer: 59 | Admitting: Medical

## 2023-02-15 ENCOUNTER — Telehealth: Payer: Self-pay | Admitting: Medical

## 2023-02-15 NOTE — Telephone Encounter (Signed)
Can we get the status of the MRI imaging

## 2023-02-15 NOTE — Telephone Encounter (Signed)
Left detailed message that it was submitted

## 2023-02-15 NOTE — Telephone Encounter (Signed)
Pt would like you to call her when you can. She says her insurance is trying to get approved for referral which could take 15 days and she is in pain. She says the meds Vincenza Hews gave helps take the edge off the pain but doesn't relieve it.  She also says fmla forms were faxed here due to her job not having anything weight restricted she can do. I don't see them up front so was wondering if you all have them in the back?

## 2023-02-15 NOTE — Telephone Encounter (Signed)
[employer] forms sent back in folder

## 2023-02-17 ENCOUNTER — Other Ambulatory Visit: Payer: Self-pay | Admitting: Medical

## 2023-02-17 DIAGNOSIS — M542 Cervicalgia: Secondary | ICD-10-CM

## 2023-02-17 DIAGNOSIS — M792 Neuralgia and neuritis, unspecified: Secondary | ICD-10-CM

## 2023-02-17 NOTE — Telephone Encounter (Signed)
 Pt would like you to call her when you can. She says her insurance is trying to get approved for referral which could take 15 days and she is in pain. She says the meds Jacqueline Winters gave helps take the edge off the pain but doesn't relieve it.  She also says fmla forms were faxed here due to her job not having anything weight restricted she can do. I don't see them up front so was wondering if you all have them in the back?

## 2023-02-17 NOTE — Telephone Encounter (Signed)
Pt called back and states she has went to [facility] and they did an xray and found that she has a pinched nerve, bone spur growth, and her roder cuff is inflamed. The doctor there also referred her to go to PT 2-3 times a week but they said they can't fill out her fmla. She says she was hoping they sent a copy of everything here but she will be uploading the paper work from their on My Chart for you to look over.

## 2023-02-20 NOTE — Telephone Encounter (Signed)
Left message for pt to call, forms ready to be faxed, need to collect $25 form fee Jacqueline Winters has completed current form but will not complete going forward, will have to go thru Ortho

## 2023-03-04 ENCOUNTER — Ambulatory Visit
Admission: RE | Admit: 2023-03-04 | Discharge: 2023-03-04 | Disposition: A | Discharge status: Home / Self Care | Payer: 59 | Source: Ambulatory Visit | Attending: Medical | Admitting: Medical

## 2023-03-04 DIAGNOSIS — M542 Cervicalgia: Secondary | ICD-10-CM

## 2023-03-06 ENCOUNTER — Other Ambulatory Visit (HOSPITAL_COMMUNITY): Payer: Self-pay

## 2023-03-06 ENCOUNTER — Other Ambulatory Visit: Payer: Self-pay | Admitting: Medical

## 2023-03-06 MED ORDER — GABAPENTIN 300 MG PO CAPS
300.0000 mg | ORAL_CAPSULE | Freq: Two times a day (BID) | ORAL | 0 refills | Status: DC
  Filled 2023-03-06: qty 45, 23d supply, fill #0

## 2023-03-07 ENCOUNTER — Other Ambulatory Visit (HOSPITAL_COMMUNITY): Payer: Self-pay

## 2023-03-07 ENCOUNTER — Ambulatory Visit: Payer: 59 | Admitting: Medical

## 2023-03-07 VITALS — BP 110/70 | HR 74 | Wt 229.8 lb

## 2023-03-07 DIAGNOSIS — M25511 Pain in right shoulder: Secondary | ICD-10-CM | POA: Diagnosis not present

## 2023-03-07 DIAGNOSIS — M5412 Radiculopathy, cervical region: Secondary | ICD-10-CM

## 2023-03-07 DIAGNOSIS — M7541 Impingement syndrome of right shoulder: Secondary | ICD-10-CM

## 2023-03-07 DIAGNOSIS — G8929 Other chronic pain: Secondary | ICD-10-CM

## 2023-03-07 DIAGNOSIS — R202 Paresthesia of skin: Secondary | ICD-10-CM

## 2023-03-07 MED ORDER — GABAPENTIN 400 MG PO CAPS
400.0000 mg | ORAL_CAPSULE | Freq: Two times a day (BID) | ORAL | 1 refills | Status: DC
  Filled 2023-03-07: qty 60, 30d supply, fill #0
  Filled 2023-04-20: qty 60, 30d supply, fill #1

## 2023-03-07 MED ORDER — CYCLOBENZAPRINE HCL 10 MG PO TABS
10.0000 mg | ORAL_TABLET | Freq: Two times a day (BID) | ORAL | 1 refills | Status: DC | PRN
  Filled 2023-03-07: qty 30, 15d supply, fill #0
  Filled 2023-04-20: qty 30, 15d supply, fill #1

## 2023-03-07 NOTE — Progress Notes (Signed)
Subjective:  Jacqueline Winters is a 42 y.o. female who presents for Chief Complaint  Patient presents with   Consult    Follow-up on MRI results     Here for follow up on neck and shoulder issues.  I saw her for the same issue back on 02/07/2023.  Since then she has seen orthopedics, has started physical therapy, and overall today her pain level is that of 3-4/10 out of pain.  She is improving but still not quite back to where she needs to be for work.  She works as a Engineer, civil (consulting).  She sees orthopedics in follow-up tomorrow.  Last physical therapy visit scheduled for December 6.  Still has neck pain, shoulder pain, arm pain, tingling.  At night right shoulder feels detached from body, gets pinching pain in right arm, and decreased sensation in right index and ring fingers.  Whole arm at times feels like its going to sleep.  When she turns her neck to the right or looking up or down she can get a sudden jolt of pain.  She still has numbness in her right index and middle finger.  Sometimes the arm can feel numb.  No other aggravating or relieving factors.    No other c/o.    The following portions of the patient's history were reviewed and updated as appropriate: allergies, current medications, past family history, past medical history, past social history, past surgical history and problem list.  ROS Otherwise as in subjective above    Objective: BP 110/70   Pulse 74   Wt 229 lb 12.8 oz (104.2 kg)   BMI 39.45 kg/m   General appearance: alert, no distress, well developed, well nourished tender in the posterior neck, otherwise neck nontender to palpation, range of motion better today than last visit but slightly reduced range of motion of the right with rotation and she does seem to have sudden pain which she moves looks to the right or looks down or looks up, otherwise nontender, no mass or thyromegaly or lymphadenopathy Tender right upper back paraspinal region Arms nontender with normal  range of motion no obvious deformity Arms neurovascularly intact No redness swelling or deformity    Assessment: Encounter Diagnoses  Name Primary?   Cervical radiculitis Yes   Chronic right shoulder pain    Shoulder impingement syndrome, right    Paresthesias in right hand       Plan: We discussed symptoms and concerns.  She is starting to see some improvement.  She still gets jolts of pain down and still getting significant tingling and numbness in the right hand.  Increase gabapentin to 400 mg twice daily.  Continue Flexeril as needed.  I reviewed her orthopedic notes from 02/16/2023 visit.  At that time she had steroid injection of the right shoulder subacromial space.  She has follow-up tomorrow with orthopedics.  She also finishes physical therapy soon at the orthopedic office.  I will defer work restrictions or work absence to orthopedics.  We have made the referral to orthopedics and since they are involved in her care now they should dictate how much time she may need out of work.  Her FMLA was initially written that they tended back to work date of tomorrow 03/08/2023.  She does not feel she is ready to go back yet.  We also do not have the MRI cervical spine results back yet.  She had the MRI 3 days ago, pending results.  Follow-up with orthopedics, physical therapy and follow-up  pending MRI results    [name] was seen today for consult.  Diagnoses and all orders for this visit:  Cervical radiculitis  Chronic right shoulder pain  Shoulder impingement syndrome, right  Paresthesias in right hand  Other orders -     cyclobenzaprine (FLEXERIL) 10 MG tablet; Take 1 tablet (10 mg total) by mouth 2 (two) times daily as needed for muscle spasms. -     gabapentin (NEURONTIN) 400 MG capsule; Take 1 capsule (400 mg total) by mouth 2 (two) times daily.    Follow up: pending MRI , ortho

## 2023-03-08 ENCOUNTER — Other Ambulatory Visit (HOSPITAL_COMMUNITY): Payer: Self-pay

## 2023-03-08 MED ORDER — METHYLPREDNISOLONE 4 MG PO TBPK
ORAL_TABLET | ORAL | 0 refills | Status: AC
  Filled 2023-03-08: qty 21, 6d supply, fill #0

## 2023-03-08 NOTE — Progress Notes (Signed)
MRI shows a bulging disc C6-C7 and some narrowing around the C7 nerve root which could be causing her pain  Please fax this over to [facility] orthopedics as well  Follow-up with orthopedics and therapy

## 2023-03-13 ENCOUNTER — Ambulatory Visit: Payer: 59 | Admitting: Medical

## 2023-03-30 IMAGING — US US THYROID
1 series · 13 of 25 positions shown · non-contrast
Comparison: 12/07/2017

CLINICAL DATA: Thyroid nodules, previous biopsy of the dominant
right midpole nodule 07/24/2013

EXAM:
THYROID ULTRASOUND
TECHNIQUE: Ultrasound examination of the thyroid gland and adjacent soft
tissues was performed.

[Series 1: us thyroid · 0.07mm/px · 13 of 57 slices shown]
[im 1/57]
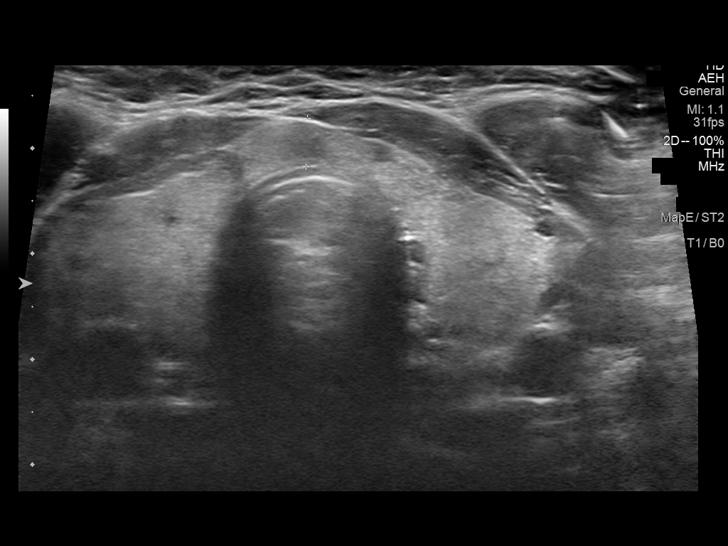
[im 5/57]
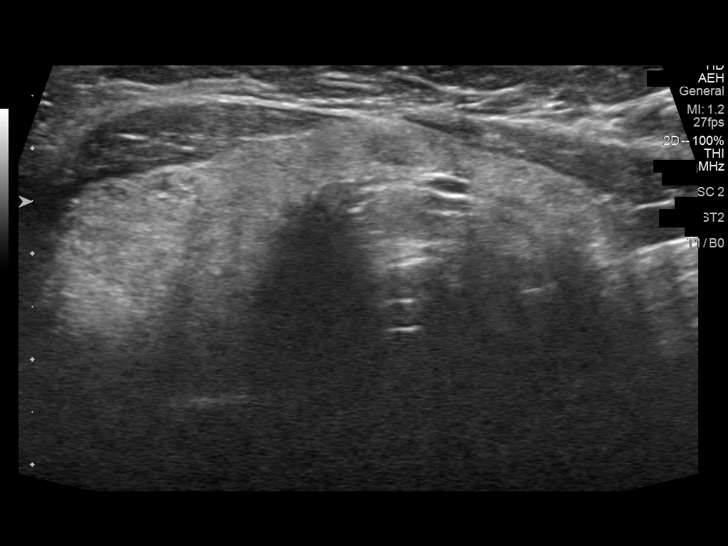
[im 10/57]
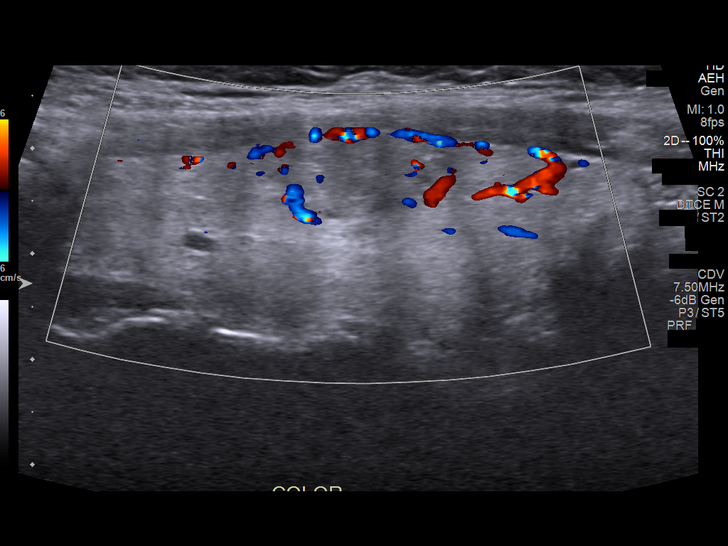
[im 15/57]
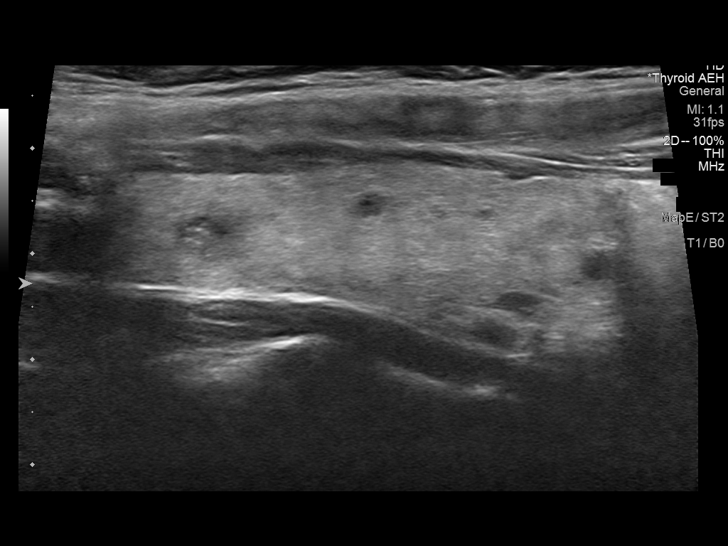
[im 19/57]
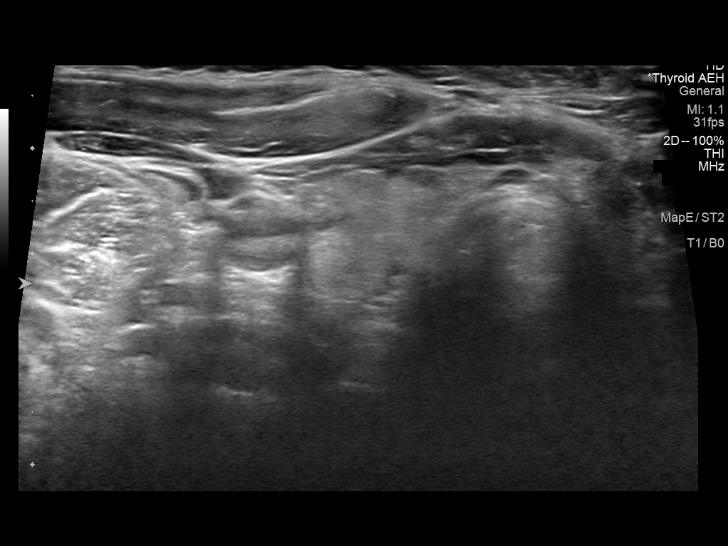
[im 24/57]
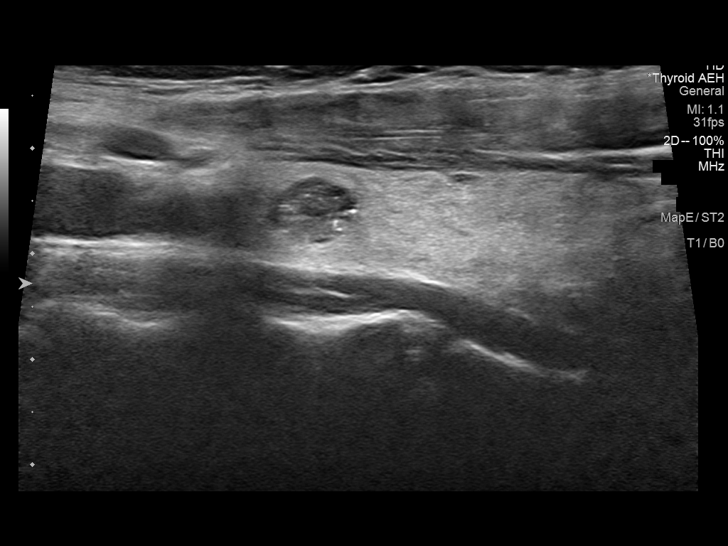
[im 29/57]
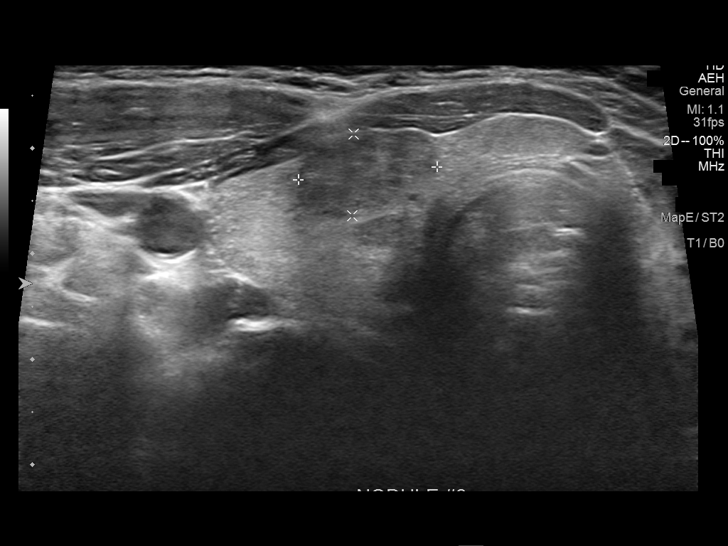
[im 33/57]
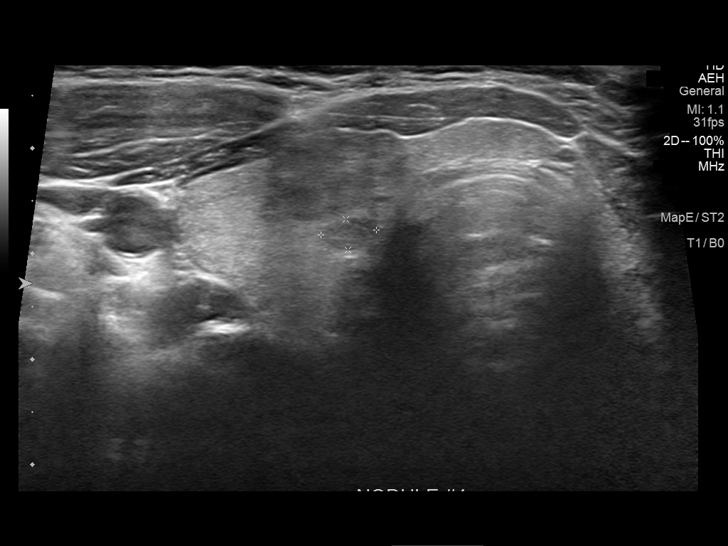
[im 38/57]
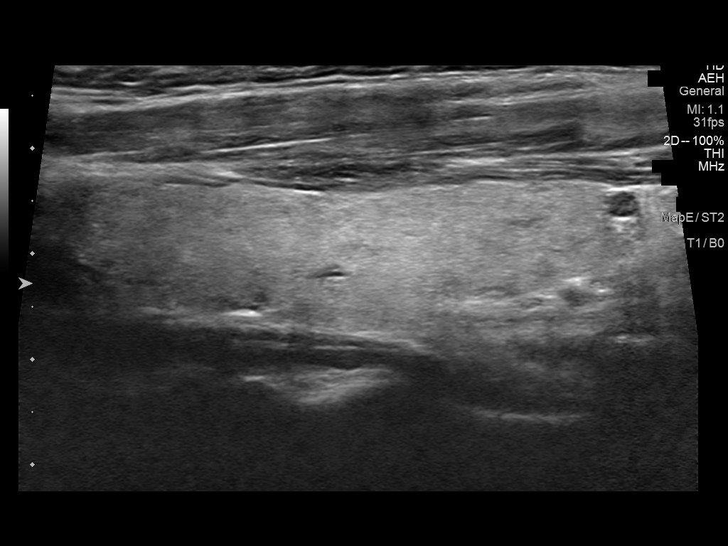
[im 43/57]
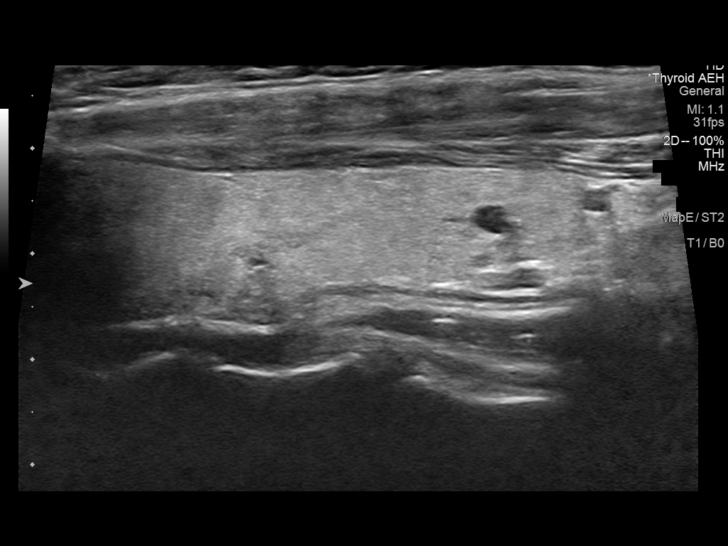
[im 47/57]
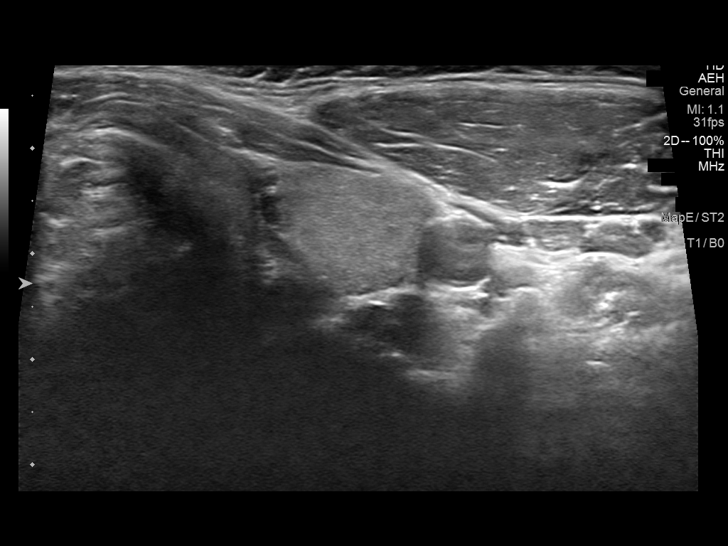
[im 52/57]
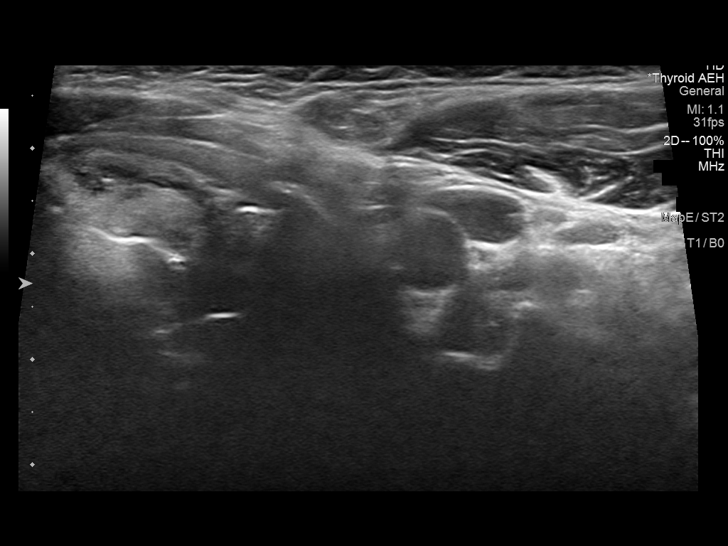
[im 57/57]
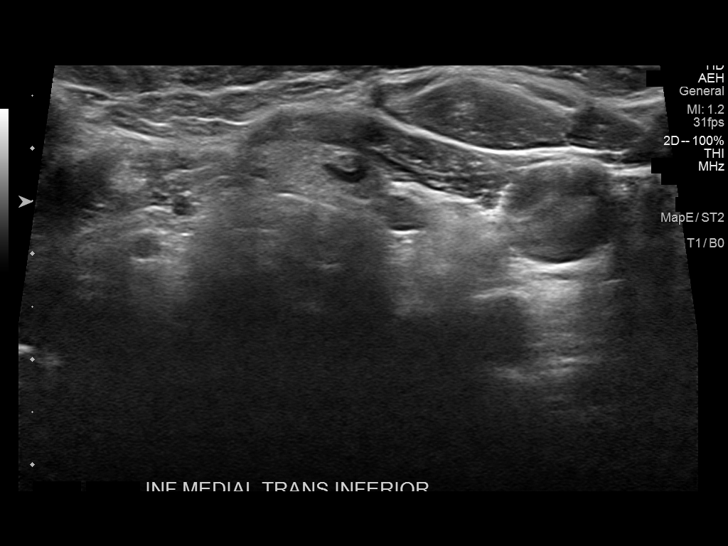

[13 of 25 positions shown; findings below may reference images not displayed]

FINDINGS: Parenchymal Echotexture: Moderately heterogenous

Isthmus: 5 mm

Right lobe: 5.8 x 1.7 x 2.1 cm

Left lobe: 5.8 x 1.3 x 1.8 cm

_________________________________________________________

Estimated total number of nodules >/= 1 cm: 1

Number of spongiform nodules >/=  2 cm not described below (TR1): 0

Number of mixed cystic and solid nodules >/= 1.5 cm not described
below (TR2): 0

_________________________________________________________

The previously biopsied medial right mid thyroid solid hypoechoic TR
4 type nodule measures 2.3 x 1.3 x 0.8 cm, previously 1.9 x 1.5 x
0.8 cm. Very little interval change. Correlate with prior pathology.

There are additional bilateral subcentimeter mixed cystic/solid and
hypoechoic nodules noted, all measuring 9 mm or less in size. These
would not meet criteria for any biopsy or follow-up and are not
fully described by TI rads criteria.

Normal vascularity. Nonspecific thyroid heterogeneity. No regional
adenopathy.
IMPRESSION: Grossly stable 2.3 cm right mid thyroid TR 4 type nodule, previously
biopsied. Correlate with prior pathology.

Other benign subcentimeter nodules noted.

Currently no nodule that warrants follow-up or biopsy.

The above is in keeping with the ACR TI-RADS recommendations - [HOSPITAL] 8852;[DATE].

## 2023-03-31 ENCOUNTER — Telehealth: Payer: Self-pay | Admitting: Medical

## 2023-03-31 NOTE — Telephone Encounter (Signed)
[employer]ML[employer] forms received and sent back in folder. Return to [facility] for form fee

## 2023-04-03 NOTE — Telephone Encounter (Signed)
Pt informed and ortho has forms to complete

## 2023-05-09 ENCOUNTER — Ambulatory Visit: Payer: 59 | Admitting: Medical

## 2023-05-09 VITALS — BP 110/70 | HR 72 | Wt 227.0 lb

## 2023-05-09 DIAGNOSIS — D649 Anemia, unspecified: Secondary | ICD-10-CM | POA: Diagnosis not present

## 2023-05-09 DIAGNOSIS — R7301 Impaired fasting glucose: Secondary | ICD-10-CM

## 2023-05-09 DIAGNOSIS — E049 Nontoxic goiter, unspecified: Secondary | ICD-10-CM

## 2023-05-09 DIAGNOSIS — R5383 Other fatigue: Secondary | ICD-10-CM | POA: Diagnosis not present

## 2023-05-09 LAB — CBC

## 2023-05-09 NOTE — Progress Notes (Signed)
Subjective:  Jacqueline Winters Winters is a 43 y.o. female who presents for Chief Complaint  Patient presents with   Consult    Would like iron and A1c checked. Very tired. Having another Cortisone injection on Thursday and would like updated blood work done before she has the injection. Had Covid back end of December and not sure if this is long term effects of it     Here for fatigue, labs.    She has been seeing orthopedics, both Dr. Althea Winters and Dr. Regino Winters at [facility] orthopedics.  She has been having some steroid injections in her neck.  She is due to have another injection soon  Lately she just feels really fatigued all the time.  She eats typically twice a day, sometimes 3 times a day.  Sleep relatively okay given the neck pain.  She also check some labs today  She has history of impaired glucose also check her A1c  She does have heavy periods at times and has been anemic in the past, also check iron and blood counts  She was taking iron every other day in the last month or so but as of this week changed to taking iron twice daily every day  No blood in the stool.  At times she is drinking 2 cups of coffee just to get going in the morning.  Feels cloudy in her mentation sometimes, fatigue, tired.  She uses Flexeril sometimes at night to help with sleep and given the neck spasms.  She is taking gabapentin.  No prior sleep study although we have discussed that before.  Husband sometimes says she delays in breathing but no loud snoring.  Her sister does have a history of sleep apnea and is on CPAP  Out of work since 02/2023 due to neck issues   No other aggravating or relieving factors.    No other c/o.  Past Medical History:  Diagnosis Date   Allergy    GERD (gastroesophageal reflux disease)    with pregnancy   Goiter    Headache(784.0)    Heart murmur    Hidradenitis axillaris    Impaired fasting blood sugar    Insomnia    Obesity    PONV (postoperative nausea and vomiting)     Thyroid nodule    biopsy 07/2013, consult with Dr. Corwin Winters endocrinology   Current Outpatient Medications on File Prior to Visit  Medication Sig Dispense Refill   Cetirizine HCl (ZYRTEC ALLERGY PO) Take by mouth.     cholecalciferol (VITAMIN D3) 25 MCG (1000 UNIT) tablet Take by mouth daily.     cyclobenzaprine (FLEXERIL) 10 MG tablet Take 1 tablet (10 mg total) by mouth 2 (two) times daily as needed for muscle spasms. 30 tablet 1   FOLIC ACID PO Take by mouth.     gabapentin (NEURONTIN) 400 MG capsule Take 1 capsule (400 mg total) by mouth 2 (two) times daily. 60 capsule 1   iron polysaccharides (NIFEREX) 150 MG capsule Take 1 capsule (150 mg total) by mouth 2 (two) times daily. 180 capsule 0   Magnesium 200 MG TABS Take 250 mg by mouth daily.     Multiple Vitamin (MULTIVITAMIN) tablet Take 1 tablet by mouth daily.     Multiple Vitamins-Minerals (ZINC PO) Take by mouth daily in the afternoon.     tranexamic acid (LYSTEDA) 650 MG TABS tablet Take 2 tablets (1,300 mg total) by mouth 3 (three) times daily. 90 tablet 4   VITAMIN E PO Take  1 capsule by mouth daily.     acetaminophen (TYLENOL) 500 MG tablet Take 500 mg by mouth every 6 (six) hours as needed.     No current facility-administered medications on file prior to visit.     The following portions of the patient's history were reviewed and updated as appropriate: allergies, current medications, past family history, past medical history, past social history, past surgical history and problem list.  ROS Otherwise as in subjective above    Objective: BP 110/70   Pulse 72   Wt 227 lb (103 kg)   SpO2 98%   BMI 38.96 kg/m   Wt Readings from Last 3 Encounters:  05/09/23 227 lb (103 kg)  03/07/23 229 lb 12.8 oz (104.2 kg)  02/07/23 233 lb 6.4 oz (105.9 kg)   BP Readings from Last 3 Encounters:  05/09/23 110/70  03/07/23 110/70  02/07/23 120/76    General appearance: alert, no distress, well developed, well  nourished Neck with moderate goiter but no specific nodules no other mass Heart: RRR, normal S1, S2, no murmurs Lungs: CTA bilaterally, no wheezes, rhonchi, or rales Abdomen: +bs, soft, non tender, non distended, no masses, no hepatomegaly, no splenomegaly Pulses: 2+ radial pulses, 2+ pedal pulses, normal cap refill Ext: no edema Psych: Pleasant, answers questions appropriately Neuro: CN II through XII intact, nonfocal exam    Assessment: Encounter Diagnoses  Name Primary?   Impaired fasting blood sugar Yes   Thyroid goiter    Fatigue, unspecified type    Anemia, unspecified type      Plan: We discussed possible causes of fatigue.  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  Just had COVID again at the end of 2024.  We discussed possibly getting a sleep study at some point.  We have discussed this before but she has not been agreeable prior.  She may consider this going forward particularly if the labs are normal  Updated labs as below  Consider follow-up with endocrinology.  She saw an endocrinologist back in 2023 given some low cortisol levels  Anemia-she just recently switched from iron every other day to iron daily twice daily as of 4 days ago.  Updated blood work as below  Thyroid goiter-updated thyroid labs.   [name] was seen today for consult.  Diagnoses and all orders for this visit:  Impaired fasting blood sugar -     Hemoglobin A1c  Thyroid goiter -     Thyroid peroxidase antibody -     Thyroid Panel With TSH  Fatigue, unspecified type -     Iron, TIBC and Ferritin Panel -     CBC -     Basic metabolic panel -     Thyroid peroxidase antibody -     Thyroid Panel With TSH  Anemia, unspecified type -     Iron, TIBC and Ferritin Panel -     CBC    Follow up: pending labs

## 2023-05-12 LAB — HEMOGLOBIN A1C
Est. average glucose Bld gHb Est-mCnc: 108 mg/dL
Hgb A1c MFr Bld: 5.4 % (ref 4.8–5.6)

## 2023-05-12 LAB — IRON,TIBC AND FERRITIN PANEL
Ferritin: 23 ng/mL (ref 15–150)
Iron Saturation: 11 % — ABNORMAL LOW (ref 15–55)
Iron: 40 ug/dL (ref 27–159)
Total Iron Binding Capacity: 363 ug/dL (ref 250–450)
UIBC: 323 ug/dL (ref 131–425)

## 2023-05-12 LAB — CBC
Hematocrit: 35.9 % (ref 34.0–46.6)
Hemoglobin: 11.7 g/dL (ref 11.1–15.9)
MCH: 27.7 pg (ref 26.6–33.0)
MCHC: 32.6 g/dL (ref 31.5–35.7)
MCV: 85 fL (ref 79–97)
Platelets: 391 10*3/uL (ref 150–450)
RBC: 4.22 x10E6/uL (ref 3.77–5.28)
RDW: 14.8 % (ref 11.7–15.4)
WBC: 9.5 10*3/uL (ref 3.4–10.8)

## 2023-05-12 LAB — BASIC METABOLIC PANEL
BUN/Creatinine Ratio: 9 (ref 9–23)
BUN: 7 mg/dL (ref 6–24)
CO2: 23 mmol/L (ref 20–29)
Calcium: 9.3 mg/dL (ref 8.7–10.2)
Chloride: 103 mmol/L (ref 96–106)
Creatinine, Ser: 0.81 mg/dL (ref 0.57–1.00)
Glucose: 86 mg/dL (ref 70–99)
Potassium: 4.1 mmol/L (ref 3.5–5.2)
Sodium: 138 mmol/L (ref 134–144)
eGFR: 93 mL/min/{1.73_m2} (ref >59)

## 2023-05-12 LAB — THYROID PEROXIDASE ANTIBODY: Thyroperoxidase Ab SerPl-aCnc: 15 [IU]/mL (ref 0–34)

## 2023-05-12 NOTE — Progress Notes (Signed)
 Results sent through MyChart

## 2023-05-15 ENCOUNTER — Non-Acute Institutional Stay (HOSPITAL_COMMUNITY)
Admission: RE | Admit: 2023-05-15 | Discharge: 2023-05-15 | Disposition: A | Discharge status: Home / Self Care | Payer: 59 | Source: Ambulatory Visit | Attending: Internal Medicine | Admitting: Internal Medicine

## 2023-05-15 DIAGNOSIS — N920 Excessive and frequent menstruation with regular cycle: Secondary | ICD-10-CM | POA: Insufficient documentation

## 2023-05-15 DIAGNOSIS — D500 Iron deficiency anemia secondary to blood loss (chronic): Secondary | ICD-10-CM | POA: Insufficient documentation

## 2023-05-15 MED ORDER — SODIUM CHLORIDE 0.9 % IV SOLN: INTRAVENOUS | Status: DC | PRN

## 2023-05-15 MED ORDER — SODIUM CHLORIDE 0.9 % IV SOLN
510.0000 mg | Freq: Once | INTRAVENOUS | Status: AC
  Administered 2023-05-15: 510 mg via INTRAVENOUS
  Filled 2023-05-15: qty 17

## 2023-05-15 NOTE — Progress Notes (Signed)
PATIENT CARE CENTER NOTE  Diagnosis: D64.9   Provider: Benard Rink  Procedure: Feraheme 510 mg (dose #1 of 1)  Note: Patient received Feraheme 510 mg infusion via PIV. No pre-medications per orders. Pt tolerated infusion with no adverse reaction. Pt observed for 30 minutes post infusion. Vital signs are stable. AVS printed and given to pt. Pt is alert, oriented, and ambulatory at discharge.

## 2023-05-29 ENCOUNTER — Other Ambulatory Visit (HOSPITAL_COMMUNITY): Payer: Self-pay

## 2023-05-29 MED ORDER — TRAMADOL HCL 50 MG PO TABS
100.0000 mg | ORAL_TABLET | Freq: Three times a day (TID) | ORAL | 0 refills | Status: DC
  Filled 2023-05-29: qty 30, 5d supply, fill #0

## 2023-05-29 MED ORDER — POLYETHYLENE GLYCOL 3350 17 GM/SCOOP PO POWD
17.0000 g | Freq: Every day | ORAL | 0 refills | Status: DC | PRN
  Filled 2023-05-29: qty 476, 28d supply, fill #0

## 2023-05-30 ENCOUNTER — Encounter (HOSPITAL_COMMUNITY): Payer: Self-pay

## 2023-05-30 ENCOUNTER — Other Ambulatory Visit (HOSPITAL_COMMUNITY): Payer: Self-pay

## 2023-05-30 MED ORDER — MELOXICAM 15 MG PO TABS
15.0000 mg | ORAL_TABLET | Freq: Every day | ORAL | 0 refills | Status: DC
  Filled 2023-05-30: qty 30, 30d supply, fill #0

## 2023-06-01 ENCOUNTER — Other Ambulatory Visit (HOSPITAL_COMMUNITY): Payer: Self-pay

## 2023-06-01 ENCOUNTER — Telehealth: Payer: 59 | Admitting: Physician Assistant

## 2023-06-01 DIAGNOSIS — R6889 Other general symptoms and signs: Secondary | ICD-10-CM

## 2023-06-01 DIAGNOSIS — R1110 Vomiting, unspecified: Secondary | ICD-10-CM

## 2023-06-01 MED ORDER — OSELTAMIVIR PHOSPHATE 75 MG PO CAPS
75.0000 mg | ORAL_CAPSULE | Freq: Two times a day (BID) | ORAL | 0 refills | Status: DC
Start: 2023-06-01 — End: 2023-06-06
  Filled 2023-06-01: qty 10, 5d supply, fill #0

## 2023-06-01 MED ORDER — ONDANSETRON 8 MG PO TBDP
8.0000 mg | ORAL_TABLET | Freq: Three times a day (TID) | ORAL | 0 refills | Status: DC | PRN
  Filled 2023-06-01: qty 20, 7d supply, fill #0

## 2023-06-01 NOTE — Patient Instructions (Signed)
Jacqueline Winters, thank you for joining Gilberto Better, PA-C for today's virtual visit.  While this provider is not your primary care provider (PCP), if your PCP is located in our provider database this encounter information will be shared with them immediately following your visit.   A [facility] MyChart account gives you access to today's visit and all your visits, tests, and labs performed at [facility] " click here if you don't have a [facility] MyChart account or go to [email or web address]  Consent: (Patient) Jacqueline Winters provided verbal consent for this virtual visit at the beginning of the encounter.  Current Medications:  Current Outpatient Medications:    ondansetron (ZOFRAN-ODT) 8 MG disintegrating tablet, Take 1 tablet (8 mg total) by mouth every 8 (eight) hours as needed for nausea or vomiting., Disp: 20 tablet, Rfl: 0   oseltamivir (TAMIFLU) 75 MG capsule, Take 1 capsule (75 mg total) by mouth 2 (two) times daily for 5 days., Disp: 10 capsule, Rfl: 0   acetaminophen (TYLENOL) 500 MG tablet, Take 500 mg by mouth every 6 (six) hours as needed., Disp: , Rfl:    Cetirizine HCl (ZYRTEC ALLERGY PO), Take by mouth., Disp: , Rfl:    cholecalciferol (VITAMIN D3) 25 MCG (1000 UNIT) tablet, Take by mouth daily., Disp: , Rfl:    cyclobenzaprine (FLEXERIL) 10 MG tablet, Take 1 tablet (10 mg total) by mouth 2 (two) times daily as needed for muscle spasms., Disp: 30 tablet, Rfl: 1   FOLIC ACID PO, Take by mouth., Disp: , Rfl:    gabapentin (NEURONTIN) 400 MG capsule, Take 1 capsule (400 mg total) by mouth 2 (two) times daily., Disp: 60 capsule, Rfl: 1   iron polysaccharides (NIFEREX) 150 MG capsule, Take 1 capsule (150 mg total) by mouth 2 (two) times daily., Disp: 180 capsule, Rfl: 0   Magnesium 200 MG TABS, Take 250 mg by mouth daily., Disp: , Rfl:    meloxicam (MOBIC) 15 MG tablet, Take 1 tablet (15 mg total) by mouth daily., Disp: 30 tablet, Rfl: 0   Multiple Vitamin  (MULTIVITAMIN) tablet, Take 1 tablet by mouth daily., Disp: , Rfl:    Multiple Vitamins-Minerals (ZINC PO), Take by mouth daily in the afternoon., Disp: , Rfl:    polyethylene glycol powder (MIRALAX) 17 GM/SCOOP powder, Mix 1 capful (17 g total) in 8 oz of liquid and drink daily as needed., Disp: 476 g, Rfl: 0   traMADol (ULTRAM) 50 MG tablet, Take 2 tablets (100 mg total) by mouth 3 (three) times daily when necessary., Disp: 30 tablet, Rfl: 0   tranexamic acid (LYSTEDA) 650 MG TABS tablet, Take 2 tablets (1,300 mg total) by mouth 3 (three) times daily., Disp: 90 tablet, Rfl: 4   VITAMIN E PO, Take 1 capsule by mouth daily., Disp: , Rfl:    Medications ordered in this encounter:  Meds ordered this encounter  Medications   oseltamivir (TAMIFLU) 75 MG capsule    Sig: Take 1 capsule (75 mg total) by mouth 2 (two) times daily for 5 days.    Dispense:  10 capsule    Refill:  0    Supervising Provider:   Merrilee Jansky [[id number]]   ondansetron (ZOFRAN-ODT) 8 MG disintegrating tablet    Sig: Take 1 tablet (8 mg total) by mouth every 8 (eight) hours as needed for nausea or vomiting.    Dispense:  20 tablet    Refill:  0    Supervising Provider:   Merrilee Jansky [  [id number]]     *If you need refills on other medications prior to your next appointment, please contact your pharmacy*  Follow-Up: Call back or seek an in-person evaluation if the symptoms worsen or if the condition fails to improve as anticipated.  [facility] [phone]  Other Instructions Stay well hydrated Take Ibuprofen alternating with Tylenol for fever and body aches Take Imodium for diarrhea. Take Zofran for vomiting.  Take Tamiflu for flu-type illness. REST Take medicine as prescribed. Continue to watch for worsening symptoms. If no improvement then go to Urgent care clinic for further evaluation.    If you have been instructed to have an in-person evaluation today at a local Urgent Care facility,  please use the link below. It will take you to a list of all of our available [facility], including address, phone number and hours of operation. Please do not delay care.  [facility]  If you or a family member do not have a primary care provider, use the link below to schedule a visit and establish care. When you choose a [facility] primary care physician or advanced practice provider, you gain a long-term partner in health. Find a Primary Care Provider  Learn more about [facility]'s in-office and virtual care options: [facility] - Get Care Now

## 2023-06-01 NOTE — Progress Notes (Signed)
Virtual Visit Consent   Jacqueline Winters, you are scheduled for a virtual visit with a [facility] provider today. Just as with appointments in the office, your consent must be obtained to participate. Your consent will be active for this visit and any virtual visit you may have with one of our providers in the next 365 days. If you have a MyChart account, a copy of this consent can be sent to you electronically.  As this is a virtual visit, video technology does not allow for your provider to perform a traditional examination. This may limit your provider's ability to fully assess your condition. If your provider identifies any concerns that need to be evaluated in person or the need to arrange testing (such as labs, EKG, etc.), we will make arrangements to do so. Although advances in technology are sophisticated, we cannot ensure that it will always work on either your end or our end. If the connection with a video visit is poor, the visit may have to be switched to a telephone visit. With either a video or telephone visit, we are not always able to ensure that we have a secure connection.  By engaging in this virtual visit, you consent to the provision of healthcare and authorize for your insurance to be billed (if applicable) for the services provided during this visit. Depending on your insurance coverage, you may receive a charge related to this service.  I need to obtain your verbal consent now. Are you willing to proceed with your visit today? Jacqueline Winters has provided verbal consent on 06/01/2023 for a virtual visit (video or telephone). Gilberto Better, [place]  Date: 06/01/2023 5:39 PM   Virtual Visit via Video Note   I, Gilberto Better, connected with  Jacqueline Winters  ([id number], [birth year]) on 06/01/23 at  5:30 PM EST by a video-enabled telemedicine application and verified that I am speaking with the correct person using two identifiers.  Location: Patient: Virtual Visit Location Patient:  Home Provider: Virtual Visit Location Provider: Home Office   I discussed the limitations of evaluation and management by telemedicine and the availability of in person appointments. The patient expressed understanding and agreed to proceed.    History of Present Illness: Jacqueline Winters is a 43 y.o. who identifies as a female who was assigned female at birth, and is being seen today for flu like symptoms.  HPI: 43 y/o F presents via video telehealth visit for c/o fever, nausea and vomiting, diarrhea and cough x 1-2 days. Max fever of 101.6 degrees F. Flu vaccinated.   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   Encounter for health maintenance examination in adult 02/03/2020   Screening for lipid disorders 02/03/2020   Family history of cancer 02/03/2020   Screening for condition 02/03/2020   Vitamin D deficiency 03/13/2018   Screening for cervical cancer 11/27/2017   Insomnia 07/30/2015   Screening for diabetes mellitus 07/30/2015   Rhinitis, allergic 07/30/2015   Hidradenitis axillaris 07/30/2015   Thyroid goiter 02/23/2015   Impaired fasting blood sugar 02/23/2015    Allergies:  Allergies  Allergen Reactions   Hydrocodone-Acetaminophen Nausea Only   Vicodin [Hydrocodone-Acetaminophen]     uneasy   Medications:  Current  Outpatient Medications:    ondansetron (ZOFRAN-ODT) 8 MG disintegrating tablet, Take 1 tablet (8 mg total) by mouth every 8 (eight) hours as needed for nausea or vomiting., Disp: 20 tablet, Rfl: 0   oseltamivir (TAMIFLU) 75 MG capsule, Take 1 capsule (75 mg total) by mouth 2 (two) times daily for 5 days., Disp: 10 capsule, Rfl: 0   acetaminophen (TYLENOL) 500 MG tablet, Take 500 mg by mouth every 6 (six)  hours as needed., Disp: , Rfl:    Cetirizine HCl (ZYRTEC ALLERGY PO), Take by mouth., Disp: , Rfl:    cholecalciferol (VITAMIN D3) 25 MCG (1000 UNIT) tablet, Take by mouth daily., Disp: , Rfl:    cyclobenzaprine (FLEXERIL) 10 MG tablet, Take 1 tablet (10 mg total) by mouth 2 (two) times daily as needed for muscle spasms., Disp: 30 tablet, Rfl: 1   FOLIC ACID PO, Take by mouth., Disp: , Rfl:    gabapentin (NEURONTIN) 400 MG capsule, Take 1 capsule (400 mg total) by mouth 2 (two) times daily., Disp: 60 capsule, Rfl: 1   iron polysaccharides (NIFEREX) 150 MG capsule, Take 1 capsule (150 mg total) by mouth 2 (two) times daily., Disp: 180 capsule, Rfl: 0   Magnesium 200 MG TABS, Take 250 mg by mouth daily., Disp: , Rfl:    meloxicam (MOBIC) 15 MG tablet, Take 1 tablet (15 mg total) by mouth daily., Disp: 30 tablet, Rfl: 0   Multiple Vitamin (MULTIVITAMIN) tablet, Take 1 tablet by mouth daily., Disp: , Rfl:    Multiple Vitamins-Minerals (ZINC PO), Take by mouth daily in the afternoon., Disp: , Rfl:    polyethylene glycol powder (MIRALAX) 17 GM/SCOOP powder, Mix 1 capful (17 g total) in 8 oz of liquid and drink daily as needed., Disp: 476 g, Rfl: 0   traMADol (ULTRAM) 50 MG tablet, Take 2 tablets (100 mg total) by mouth 3 (three) times daily when necessary., Disp: 30 tablet, Rfl: 0   tranexamic acid (LYSTEDA) 650 MG TABS tablet, Take 2 tablets (1,300 mg total) by mouth 3 (three) times daily., Disp: 90 tablet, Rfl: 4   VITAMIN E PO, Take 1 capsule by mouth daily., Disp: , Rfl:   Observations/Objective: Patient is well-developed, well-nourished in no acute distress.  Resting comfortably  at home.  Head is normocephalic, atraumatic.  No labored breathing.  Speech is clear and coherent with logical content.  Patient is alert and oriented at baseline.    Assessment and Plan: 1. Flu-like symptoms (Primary) - oseltamivir (TAMIFLU) 75 MG capsule; Take 1 capsule (75 mg total) by mouth 2 (two) times daily  for 5 days.  Dispense: 10 capsule; Refill: 0  2. Vomiting, unspecified vomiting type, unspecified whether nausea present - ondansetron (ZOFRAN-ODT) 8 MG disintegrating tablet; Take 1 tablet (8 mg total) by mouth every 8 (eight) hours as needed for nausea or vomiting.  Dispense: 20 tablet; Refill: 0  Stay well hydrated Take Ibuprofen alternating with Tylenol for fever and body aches Take Imodium for diarrhea. Take Zofran for vomiting.  Take Tamiflu for flu-type illness. REST Take medicine as prescribed. Continue to watch for worsening symptoms. If no improvement then go to Urgent care clinic for further evaluation.  Pt verbalized understanding and in agreement.    Follow Up Instructions: I discussed the assessment and treatment plan with the patient. The patient was provided an opportunity to ask questions and all were answered. The patient agreed with the plan and demonstrated an understanding of the instructions.  A copy of instructions were  sent to the patient via MyChart unless otherwise noted below.   Patient has requested to receive PHI (AVS, Work Notes, etc) pertaining to this video visit through e-mail as they are currently without active MyChart. They have voiced understand that email is not considered secure and their health information could be viewed by someone other than the patient.   The patient was advised to call back or seek an in-person evaluation if the symptoms worsen or if the condition fails to improve as anticipated.    Gilberto Better, PA-C

## 2023-06-02 ENCOUNTER — Ambulatory Visit
Admission: RE | Admit: 2023-06-02 | Discharge: 2023-06-02 | Disposition: A | Discharge status: Home / Self Care | Payer: 59 | Source: Ambulatory Visit

## 2023-06-02 ENCOUNTER — Ambulatory Visit (INDEPENDENT_AMBULATORY_CARE_PROVIDER_SITE_OTHER): Payer: 59

## 2023-06-02 ENCOUNTER — Ambulatory Visit
Admission: RE | Admit: 2023-06-02 | Discharge: 2023-06-02 | Disposition: A | Discharge status: Home / Self Care | Payer: 59 | Attending: Internal Medicine | Admitting: Internal Medicine

## 2023-06-02 ENCOUNTER — Encounter: Payer: Self-pay | Admitting: Internal Medicine

## 2023-06-02 ENCOUNTER — Other Ambulatory Visit (HOSPITAL_COMMUNITY): Payer: Self-pay

## 2023-06-02 DIAGNOSIS — R0602 Shortness of breath: Secondary | ICD-10-CM | POA: Diagnosis not present

## 2023-06-02 DIAGNOSIS — R051 Acute cough: Secondary | ICD-10-CM

## 2023-06-02 MED ORDER — PROMETHAZINE-DM 6.25-15 MG/5ML PO SYRP
5.0000 mL | ORAL_SOLUTION | Freq: Three times a day (TID) | ORAL | 0 refills | Status: DC | PRN
  Filled 2023-06-02: qty 180, 12d supply, fill #0

## 2023-06-02 MED ORDER — AZITHROMYCIN 250 MG PO TABS
ORAL_TABLET | ORAL | 0 refills | Status: AC
  Filled 2023-06-02: qty 6, 5d supply, fill #0

## 2023-06-02 MED ORDER — ALBUTEROL SULFATE HFA 108 (90 BASE) MCG/ACT IN AERS
1.0000 | INHALATION_SPRAY | Freq: Four times a day (QID) | RESPIRATORY_TRACT | 0 refills | Status: AC | PRN
  Filled 2023-06-02: qty 6.7, 20d supply, fill #0

## 2023-06-02 NOTE — ED Provider Notes (Signed)
[facility]    CSN: [id number] Arrival date & time: 06/02/23  1031      History   Chief Complaint Chief Complaint  Patient presents with   Cough   Sore Throat   Fever   Chills    HPI Jacqueline Winters is a 43 y.o. female.   43 y.o. female who presents to urgent care with complaints of cough, shortness of breath, fatigue, nausea, body aches and poor appetite.  This started on 2/18.  She is having fevers as well.  This has progressed to a productive brown cough with shortness of breath.  She has also been coughing so much that she is unable to sleep and she is having nausea and vomiting secondary to it.  She has a very poor appetite but is trying to stay hydrated.  She tested herself twice for COVID which has been negative.  She has a history of having COVID multiple times.  She has a child in elementary school and a friend child but none of these have been sick.   Cough Associated symptoms: chills, ear pain, fever and sore throat   Associated symptoms: no chest pain, no rash and no shortness of breath   Sore Throat Pertinent negatives include no chest pain, no abdominal pain and no shortness of breath.  Fever Associated symptoms: chills, cough, ear pain, nausea and sore throat   Associated symptoms: no chest pain, no dysuria, no rash and no vomiting     Past Medical History:  Diagnosis Date   Allergy    GERD (gastroesophageal reflux disease)    with pregnancy   Goiter    Headache(784.0)    Heart murmur    Hidradenitis axillaris    Impaired fasting blood sugar    Insomnia    Obesity    PONV (postoperative nausea and vomiting)    Thyroid nodule    biopsy 07/2013, consult with Dr. Corwin Levins endocrinology    Patient Active Problem List   Diagnosis Date Noted   Anemia 08/08/2022   Excessive bleeding in premenopausal period 08/08/2022   Migraine 08/08/2022   Scar painful 10/18/2021   S/P plastic surgery 10/18/2021   Iron deficiency 10/18/2021    COVID-19 long hauler 01/22/2021   Encounter for health maintenance examination in adult 02/03/2020   Screening for lipid disorders 02/03/2020   Family history of cancer 02/03/2020   Screening for condition 02/03/2020   Vitamin D deficiency 03/13/2018   Screening for cervical cancer 11/27/2017   Insomnia 07/30/2015   Screening for diabetes mellitus 07/30/2015   Rhinitis, allergic 07/30/2015   Hidradenitis axillaris 07/30/2015   Thyroid goiter 02/23/2015   Impaired fasting blood sugar 02/23/2015    Past Surgical History:  Procedure Laterality Date   CESAREAN SECTION  12/21/2011   Procedure: CESAREAN SECTION;  Surgeon: Freddrick March. Tenny Craw, MD;  Location: [facility];  Service: Obstetrics;  Laterality: N/A;  Bilateral Tubal Ligation   CHOLECYSTECTOMY  2009   HYDRADENITIS EXCISION Right 09/14/2021   Procedure: EXCISION HIDRADENITIS AXILLA WITH COMPLEX CLOSURE;  Surgeon: Allena Napoleon, MD;  Location: [facility];  Service: Plastics;  Laterality: Right;  45 mins   INCISION AND DRAINAGE     right axillary boils   PILONIDAL CYST / SINUS EXCISION  2008   [facility]   TUBAL LIGATION     VAGINAL DELIVERY  2002, 1998    OB History     Gravida  3   Para  3  Term  3   Preterm      AB      Living  3      SAB      IAB      Ectopic      Multiple      Live Births  3            Home Medications    Prior to Admission medications   Medication Sig Start Date End Date Taking? Authorizing Provider  acetaminophen (TYLENOL) 500 MG tablet Take 500 mg by mouth every 6 (six) hours as needed.    [provider]  Cetirizine HCl (ZYRTEC ALLERGY PO) Take by mouth.    [provider]  cholecalciferol (VITAMIN D3) 25 MCG (1000 UNIT) tablet Take by mouth daily. 01/10/20   [provider]  cyclobenzaprine (FLEXERIL) 10 MG tablet Take 1 tablet (10 mg total) by mouth 2 (two) times daily as needed for muscle spasms. 03/07/23   Tysinger, Kermit Balo,  PA-C  FOLIC ACID PO Take by mouth.    [provider]  gabapentin (NEURONTIN) 400 MG capsule Take 1 capsule (400 mg total) by mouth 2 (two) times daily. 03/07/23   Tysinger, Kermit Balo, PA-C  iron polysaccharides (NIFEREX) 150 MG capsule Take 1 capsule (150 mg total) by mouth 2 (two) times daily. 06/30/22   Tysinger, Kermit Balo, PA-C  Magnesium 200 MG TABS Take 250 mg by mouth daily.    [provider]  meloxicam (MOBIC) 15 MG tablet Take 1 tablet (15 mg total) by mouth daily. 05/30/23     Multiple Vitamin (MULTIVITAMIN) tablet Take 1 tablet by mouth daily.    [provider]  Multiple Vitamins-Minerals (ZINC PO) Take by mouth daily in the afternoon.    [provider]  ondansetron (ZOFRAN-ODT) 8 MG disintegrating tablet Take 1 tablet (8 mg total) by mouth every 8 (eight) hours as needed for nausea or vomiting. 06/01/23   Gilberto Better, PA-C  oseltamivir (TAMIFLU) 75 MG capsule Take 1 capsule (75 mg total) by mouth 2 (two) times daily for 5 days. 06/01/23 06/06/23  Gilberto Better, PA-C  polyethylene glycol powder (MIRALAX) 17 GM/SCOOP powder Mix 1 capful (17 g total) in 8 oz of liquid and drink daily as needed. 05/29/23     traMADol (ULTRAM) 50 MG tablet Take 2 tablets (100 mg total) by mouth 3 (three) times daily when necessary. 05/29/23     tranexamic acid (LYSTEDA) 650 MG TABS tablet Take 2 tablets (1,300 mg total) by mouth 3 (three) times daily. 07/21/22     VITAMIN E PO Take 1 capsule by mouth daily.    [provider]    Family History Family History  Problem Relation Age of Onset   Hypertension Mother    Diabetes Mother    Hypertension Sister    Cancer Maternal Grandmother        colon, ovarian   Hypertension Sister    Arthritis Father    Cancer Maternal Aunt        breast   Heart disease Neg Hx    Stroke Neg Hx     Social History Social History   Tobacco Use   Smoking status: Former    Current packs/day: 0.00    Average packs/day: 0.3  packs/day for 13.0 years (3.9 ttl pk-yrs)    Types: Cigarettes    Start date: 11/22/2007    Quit date: 11/21/2020    Years since quitting: 2.5   Smokeless tobacco: Never  Vaping Use   Vaping status: Never Used  Substance Use Topics   Alcohol use: Yes    Comment: occasional   Drug use: No     Allergies   Hydrocodone-acetaminophen, Hydrocodone, and Vicodin [hydrocodone-acetaminophen]   Review of Systems Review of Systems  Constitutional:  Positive for appetite change, chills and fever.  HENT:  Positive for ear pain and sore throat.   Eyes:  Negative for pain and visual disturbance.  Respiratory:  Positive for cough. Negative for shortness of breath.   Cardiovascular:  Negative for chest pain and palpitations.  Gastrointestinal:  Positive for nausea. Negative for abdominal pain and vomiting.  Genitourinary:  Negative for dysuria and hematuria.  Musculoskeletal:  Negative for arthralgias and back pain.  Skin:  Negative for color change and rash.  Neurological:  Negative for seizures and syncope.  All other systems reviewed and are negative.    Physical Exam Triage Vital Signs ED Triage Vitals  Encounter Vitals Group     BP 06/02/23 1104 110/84     Systolic BP Percentile --      Diastolic BP Percentile --      Pulse Rate 06/02/23 1104 95     Resp 06/02/23 1104 18     Temp 06/02/23 1104 99.2 F (37.3 C)     Temp Source 06/02/23 1104 Oral     SpO2 06/02/23 1104 98 %     Weight --      Height --      Head Circumference --      Peak Flow --      Pain Score 06/02/23 1102 7     Pain Loc --      Pain Education --      Exclude from Growth Chart --    No data found.  Updated Vital Signs BP 110/84 (BP Location: Left Arm)   Pulse 95   Temp 99.2 F (37.3 C) (Oral)   Resp 18   LMP 05/19/2023   SpO2 98%   Visual Acuity Right Eye Distance:   Left Eye Distance:   Bilateral Distance:    Right Eye Near:   Left Eye Near:    Bilateral Near:     Physical Exam Vitals  and nursing note reviewed.  Constitutional:      General: She is not in acute distress.    Appearance: She is well-developed.  HENT:     Head: Normocephalic and atraumatic.     Right Ear: Tympanic membrane normal.     Left Ear: Tympanic membrane normal.     Nose: Congestion present.     Mouth/Throat:     Pharynx: Posterior oropharyngeal erythema (mild) present.  Eyes:     Conjunctiva/sclera: Conjunctivae normal.  Cardiovascular:     Rate and Rhythm: Normal rate and regular rhythm.     Heart sounds: No murmur heard. Pulmonary:     Effort: Pulmonary effort is normal. No respiratory distress.     Breath sounds: Examination of the left-upper field reveals rhonchi. Rhonchi present. No decreased breath sounds.  Abdominal:     Palpations: Abdomen is soft.     Tenderness: There is no abdominal tenderness.  Musculoskeletal:        General: No swelling.     Cervical back: Neck supple.  Skin:    General: Skin is warm and dry.     Capillary Refill: Capillary refill takes less than 2 seconds.  Neurological:     General: No focal deficit present.  Mental Status: She is alert.  Psychiatric:        Mood and Affect: Mood normal.      UC Treatments / Results  Labs (all labs ordered are listed, but only abnormal results are displayed) Labs Reviewed - No data to display  EKG   Radiology No results found.  Procedures Procedures (including critical care time)  Medications Ordered in UC Medications - No data to display  Initial Impression / Assessment and Plan / UC Course  I have reviewed the triage vital signs and the nursing notes.  Pertinent labs & imaging results that were available during my care of the patient were reviewed by me and considered in my medical decision making (see chart for details).     Acute cough - Plan: DG Chest 2 View, DG Chest 2 View  SOB (shortness of breath) - Plan: DG Chest 2 View, DG Chest 2 View   Chest x-ray done today, final results from  the radiologist are pending. Coarse lung sounds on the left upper lung which raises the concern for early pneumonia.  We will treat this with the following: Azithromycin 250mg  Take 2 tablets today and the 1 tablet daily for 4 more days. This is an antibiotic. Take this with food. Promethazine DM 5 mL every 8 hours as needed for cough.  Use caution as this medication can cause drowsiness. Zofran 4 mg orally disintegrating tablet every 8 hours as needed for nausea. (This prescription was called in yesterday) Albuterol inhaler 1-2 puffs every 6 hours as needed for wheezing/shortness of breath.  Rest and stay hydrated.   Return to urgent care or PCP if symptoms worsen or fail to resolve.    Final Clinical Impressions(s) / UC Diagnoses   Final diagnoses:  None   Discharge Instructions   None    ED Prescriptions   None    PDMP not reviewed this encounter.   Landis Martins, [place] 06/02/23 1320

## 2023-06-02 NOTE — ED Triage Notes (Signed)
Pt presents with Flu like symptoms that onset on 05/30/23. She states the mucus she is producing is brown. Pt states emesis yesterday. She also states she self tested for COVID twice and both test were negative. Pt also complains of sore chest from coughing as well as back pain between shoulder blades.

## 2023-06-02 NOTE — Discharge Instructions (Addendum)
Chest x-ray done today, final results from the radiologist are pending. Coarse lung sounds on the left upper lung which raises the concern for early pneumonia.  We will treat this with the following: Azithromycin 250mg  Take 2 tablets today and the 1 tablet daily for 4 more days. This is an antibiotic. Take this with food. Promethazine DM 5 mL every 8 hours as needed for cough.  Use caution as this medication can cause drowsiness. Zofran 4 mg orally disintegrating tablet every 8 hours as needed for nausea. (This prescription was called in yesterday) Albuterol inhaler 1-2 puffs every 6 hours as needed for wheezing/shortness of breath.  Rest and stay hydrated.   Return to urgent care or PCP if symptoms worsen or fail to resolve.

## 2023-06-09 ENCOUNTER — Other Ambulatory Visit (HOSPITAL_COMMUNITY): Payer: Self-pay

## 2023-06-27 ENCOUNTER — Other Ambulatory Visit: Payer: Self-pay | Admitting: Medical

## 2023-06-27 ENCOUNTER — Other Ambulatory Visit (HOSPITAL_COMMUNITY): Payer: Self-pay

## 2023-06-27 MED ORDER — GABAPENTIN 400 MG PO CAPS
400.0000 mg | ORAL_CAPSULE | Freq: Two times a day (BID) | ORAL | 1 refills | Status: DC
  Filled 2023-06-27: qty 60, 30d supply, fill #0

## 2023-07-21 ENCOUNTER — Other Ambulatory Visit (HOSPITAL_COMMUNITY): Payer: Self-pay

## 2023-07-21 MED ORDER — MELOXICAM 15 MG PO TABS
15.0000 mg | ORAL_TABLET | Freq: Every day | ORAL | 0 refills | Status: DC
  Filled 2023-07-21: qty 30, 30d supply, fill #0

## 2023-08-10 ENCOUNTER — Ambulatory Visit: Admitting: Medical

## 2023-08-10 VITALS — BP 110/68 | HR 58 | Wt 225.0 lb

## 2023-08-10 DIAGNOSIS — E049 Nontoxic goiter, unspecified: Secondary | ICD-10-CM

## 2023-08-10 DIAGNOSIS — Z136 Encounter for screening for cardiovascular disorders: Secondary | ICD-10-CM

## 2023-08-10 DIAGNOSIS — Z01818 Encounter for other preprocedural examination: Secondary | ICD-10-CM | POA: Diagnosis not present

## 2023-08-10 DIAGNOSIS — Z13220 Encounter for screening for lipoid disorders: Secondary | ICD-10-CM

## 2023-08-10 DIAGNOSIS — R7301 Impaired fasting glucose: Secondary | ICD-10-CM

## 2023-08-10 DIAGNOSIS — E611 Iron deficiency: Secondary | ICD-10-CM

## 2023-08-10 NOTE — Progress Notes (Signed)
 Subjective:  Jacqueline Winters is a 43 y.o. female who presents for Chief Complaint  Patient presents with   Consult    May 6th surgery for neck surgery. Needs pre-surgical lab work and EKG. Pt stopped taking all vitamins last night for surgery     Here for surgical clearance.  Having neck surgery planned for 08/15/23 for decompression and fusion soon, Dr. Jackee Winters at [facility].  Has had 4 prior EDSI, 2 other rounds of steroids.  Had some paroxsymal afib with prior surgery.  That improved with medication, metoprolol .   Other than her ongoing neck radicular issue, no other new problems.  No chest pain, palpitations, shortness of breath, edema.  No significant fatigue.  No recent bleeding or bruising.  She has had Feraheme iron  infusion 05/2023.  No other aggravating or relieving factors.    No other c/o.  Past Medical History:  Diagnosis Date   Allergy    GERD (gastroesophageal reflux disease)    with pregnancy   Goiter    Headache(784.0)    Heart murmur    Hidradenitis axillaris    Impaired fasting blood sugar    Insomnia    Obesity    PONV (postoperative nausea and vomiting)    Thyroid  nodule    biopsy 07/2013, consult with Dr. Katina Winters endocrinology   Current Outpatient Medications on File Prior to Visit  Medication Sig Dispense Refill   acetaminophen  (TYLENOL ) 500 MG tablet Take 500 mg by mouth every 6 (six) hours as needed.     albuterol  (VENTOLIN  HFA) 108 (90 Base) MCG/ACT inhaler Inhale 1-2 puffs into the lungs every 6 (six) hours as needed for wheezing or shortness of breath. 6.7 g 0   Cetirizine  HCl (ZYRTEC  ALLERGY PO) Take by mouth.     cholecalciferol (VITAMIN D3) 25 MCG (1000 UNIT) tablet Take by mouth daily.     FOLIC ACID PO Take by mouth.     iron  polysaccharides (NIFEREX) 150 MG capsule Take 1 capsule (150 mg total) by mouth 2 (two) times daily. 180 capsule 0   Magnesium 200 MG TABS Take 250 mg by mouth daily.     Multiple Vitamin (MULTIVITAMIN)  tablet Take 1 tablet by mouth daily.     Multiple Vitamins-Minerals (ZINC PO) Take by mouth daily in the afternoon.     VITAMIN E PO Take 1 capsule by mouth daily.     cyclobenzaprine  (FLEXERIL ) 10 MG tablet Take 1 tablet (10 mg total) by mouth 2 (two) times daily as needed for muscle spasms. (Patient not taking: Reported on 08/10/2023) 30 tablet 1   meloxicam  (MOBIC ) 15 MG tablet Take 1 tablet by mouth once a day (Patient not taking: Reported on 08/10/2023) 30 tablet 0   traMADol  (ULTRAM ) 50 MG tablet Take 2 tablets (100 mg total) by mouth 3 (three) times daily when necessary. (Patient not taking: Reported on 08/10/2023) 30 tablet 0   tranexamic acid  (LYSTEDA ) 650 MG TABS tablet Take 2 tablets (1,300 mg total) by mouth 3 (three) times daily. (Patient not taking: Reported on 08/10/2023) 90 tablet 4   No current facility-administered medications on file prior to visit.     The following portions of the patient's history were reviewed and updated as appropriate: allergies, current medications, past family history, past medical history, past social history, past surgical history and problem list.  ROS Otherwise as in subjective above  Objective: BP 110/68   Pulse (!) 58   Wt 225 lb (102.1 kg)   BMI  38.62 kg/m   BP Readings from Last 3 Encounters:  08/10/23 110/68  06/02/23 110/84  05/15/23 119/68   Wt Readings from Last 3 Encounters:  08/10/23 225 lb (102.1 kg)  05/09/23 227 lb (103 kg)  03/07/23 229 lb 12.8 oz (104.2 kg)    General appearance: alert, no distress, well developed, well nourished HEENT: normocephalic, sclerae anicteric, conjunctiva pink and moist,  nares patent, no discharge or erythema, pharynx normal Oral cavity: MMM, no lesions Neck: supple, no lymphadenopathy, moderate goiter, no masses, no bruits Heart: RRR, normal S1, S2, no murmurs Lungs: CTA bilaterally, no wheezes, rhonchi, or rales Abdomen: +bs, soft, non tender, non distended, no masses, no hepatomegaly, no  splenomegaly Pulses: 2+ radial pulses, 2+ pedal pulses, normal cap refill Ext: no edema Neuro: CN2-12 intact, nonfocal exam  EKG reviewed, downward deflections of the P wave in 2 and 3.  These were seen back in 2021 slightly but not on the 2022 and 23 EKGs.  I will reach out to cardiology regarding this.    Assessment: Encounter Diagnoses  Name Primary?   Preop examination Yes   Screening for heart disease    Thyroid  goiter    Iron  deficiency    Impaired fasting blood sugar    Screening for lipid disorders      Plan: Labs as below.  EKG reviewed.  I will reach out to cardiology regarding EKG compared to prior.  She has no recent cardiac symptoms.  In the interim she had an iron  infusion back in February 2025 given history of iron  deficiency.  She is on allergy medicines no recent allergy flares.  She is taking her iron  regularly.  Updated lipid screen today.  She has had impaired glucose in the past and has been on recent steroids so we will go ahead and update hemoglobin A1c today.   Jacqueline Winters was seen today for consult.  Diagnoses and all orders for this visit:  Preop examination -     EKG 12-Lead -     Comprehensive metabolic panel with GFR -     CBC with Differential/Platelet -     Iron , TIBC and Ferritin Panel -     TSH + free T4 -     PT and PTT -     Hemoglobin A1c  Screening for heart disease -     EKG 12-Lead -     Lipid Panel  Thyroid  goiter -     TSH + free T4  Iron  deficiency -     CBC with Differential/Platelet -     Iron , TIBC and Ferritin Panel  Impaired fasting blood sugar -     Hemoglobin A1c  Screening for lipid disorders -     Lipid Panel    Follow up: Pending labs

## 2023-08-11 ENCOUNTER — Other Ambulatory Visit: Payer: Self-pay | Admitting: Medical

## 2023-08-11 ENCOUNTER — Telehealth: Payer: Self-pay | Admitting: Internal Medicine

## 2023-08-11 ENCOUNTER — Telehealth: Payer: Self-pay | Admitting: Cardiology

## 2023-08-11 LAB — CBC WITH DIFFERENTIAL/PLATELET
Basophils Absolute: 0 10*3/uL (ref 0.0–0.2)
Basos: 1 %
EOS (ABSOLUTE): 0.1 10*3/uL (ref 0.0–0.4)
Eos: 1 %
Hematocrit: 40 % (ref 34.0–46.6)
Hemoglobin: 13 g/dL (ref 11.1–15.9)
Immature Grans (Abs): 0 10*3/uL (ref 0.0–0.1)
Immature Granulocytes: 1 %
Lymphocytes Absolute: 1.7 10*3/uL (ref 0.7–3.1)
Lymphs: 27 %
MCH: 29.5 pg (ref 26.6–33.0)
MCHC: 32.5 g/dL (ref 31.5–35.7)
MCV: 91 fL (ref 79–97)
Monocytes Absolute: 0.4 10*3/uL (ref 0.1–0.9)
Monocytes: 6 %
Neutrophils Absolute: 4.1 10*3/uL (ref 1.4–7.0)
Neutrophils: 64 %
Platelets: 340 10*3/uL (ref 150–450)
RBC: 4.41 x10E6/uL (ref 3.77–5.28)
RDW: 13.8 % (ref 11.7–15.4)
WBC: 6.4 10*3/uL (ref 3.4–10.8)

## 2023-08-11 LAB — COMPREHENSIVE METABOLIC PANEL WITH GFR
ALT: 12 IU/L (ref 0–32)
AST: 12 IU/L (ref 0–40)
Albumin: 4.5 g/dL (ref 3.9–4.9)
Alkaline Phosphatase: 55 IU/L (ref 44–121)
BUN/Creatinine Ratio: 13 (ref 9–23)
BUN: 10 mg/dL (ref 6–24)
Bilirubin Total: 0.3 mg/dL (ref 0.0–1.2)
CO2: 21 mmol/L (ref 20–29)
Calcium: 9.3 mg/dL (ref 8.7–10.2)
Chloride: 105 mmol/L (ref 96–106)
Creatinine, Ser: 0.79 mg/dL (ref 0.57–1.00)
Globulin, Total: 2.7 g/dL (ref 1.5–4.5)
Glucose: 90 mg/dL (ref 70–99)
Potassium: 4.3 mmol/L (ref 3.5–5.2)
Sodium: 138 mmol/L (ref 134–144)
Total Protein: 7.2 g/dL (ref 6.0–8.5)
eGFR: 96 mL/min/{1.73_m2} (ref >59)

## 2023-08-11 LAB — IRON,TIBC AND FERRITIN PANEL
Ferritin: 33 ng/mL (ref 15–150)
Iron Saturation: 42 % (ref 15–55)
Iron: 142 ug/dL (ref 27–159)
Total Iron Binding Capacity: 338 ug/dL (ref 250–450)
UIBC: 196 ug/dL (ref 131–425)

## 2023-08-11 LAB — TSH+FREE T4
Free T4: 1.2 ng/dL (ref 0.82–1.77)
TSH: 0.766 u[IU]/mL (ref 0.450–4.500)

## 2023-08-11 LAB — PT AND PTT
INR: 1 (ref 0.9–1.2)
Prothrombin Time: 11 s (ref 9.1–12.0)
aPTT: 28 s (ref 24–33)

## 2023-08-11 LAB — LIPID PANEL
Chol/HDL Ratio: 2.6 ratio (ref 0.0–4.4)
Cholesterol, Total: 171 mg/dL (ref 100–199)
HDL: 67 mg/dL (ref >39)
LDL Chol Calc (NIH): 91 mg/dL (ref 0–99)
Triglycerides: 70 mg/dL (ref 0–149)
VLDL Cholesterol Cal: 13 mg/dL (ref 5–40)

## 2023-08-11 LAB — HEMOGLOBIN A1C
Est. average glucose Bld gHb Est-mCnc: 108 mg/dL
Hgb A1c MFr Bld: 5.4 % (ref 4.8–5.6)

## 2023-08-11 NOTE — Progress Notes (Signed)
 Results sent through My Chart  Please call the office of Dr. Constancia Delton, cardiology.   I wanted to make sure he got my staff message email yesterday.  She is having surgery next week and I had a question about her EKG done yesterday.

## 2023-08-11 NOTE — Telephone Encounter (Signed)
 Called patient's PCP office per Dr Junnie Olives to explain that the patient is to be cleared for surgery next week. Dr Junnie Olives explained there were no immediate concerns or risks based on abnormal EKG. NSR w/ sinus arrhythmia.     Office will call back if they need anything else.

## 2023-08-11 NOTE — Telephone Encounter (Signed)
 Pt primary care office called in stating pt was in their office yesterday and EKG was abnormal. Dr.Tysinger sent Dr. Carl Charnley a staff message yesterday to review. She is to have surgery next week and wants to make sure she is still okay to proceed. Please advise. (EKG is in EPIC)

## 2023-08-11 NOTE — Telephone Encounter (Unsigned)
 Copied from CRM [id number]. Topic: General - Other >> Aug 11, 2023 10:17 AM Alyse July wrote: Reason for CRM: Patient has been cleared for surgery despite abnormal EKG per Dr. Agbor-etang(Cardiology).

## 2023-08-11 NOTE — Telephone Encounter (Signed)
 See previous message from Cardiology.

## 2023-08-14 NOTE — Progress Notes (Signed)
 Left detailed message with Abe Abed Dr. Jackee Marus clearance nurse letting her know pt has been cleared for surgery. I will also send over notes

## 2023-08-15 ENCOUNTER — Other Ambulatory Visit (HOSPITAL_COMMUNITY): Payer: Self-pay

## 2023-08-15 MED ORDER — METHOCARBAMOL 500 MG PO TABS
500.0000 mg | ORAL_TABLET | Freq: Four times a day (QID) | ORAL | 2 refills | Status: DC | PRN
Start: 2023-08-15 — End: 2023-11-16
  Filled 2023-08-15: qty 60, 8d supply, fill #0

## 2023-08-15 MED ORDER — HYDROCODONE-ACETAMINOPHEN 5-325 MG PO TABS
1.0000 | ORAL_TABLET | Freq: Four times a day (QID) | ORAL | 0 refills | Status: DC
  Filled 2023-08-15: qty 30, 7d supply, fill #0

## 2023-11-06 ENCOUNTER — Other Ambulatory Visit: Payer: Self-pay | Admitting: Medical

## 2023-11-07 ENCOUNTER — Other Ambulatory Visit (HOSPITAL_COMMUNITY): Payer: Self-pay

## 2023-11-07 ENCOUNTER — Other Ambulatory Visit: Payer: Self-pay

## 2023-11-07 MED ORDER — POLYSACCHARIDE IRON COMPLEX 150 MG PO CAPS
150.0000 mg | ORAL_CAPSULE | Freq: Two times a day (BID) | ORAL | 0 refills | Status: AC
  Filled 2023-11-07: qty 180, 90d supply, fill #0

## 2023-11-10 ENCOUNTER — Other Ambulatory Visit (HOSPITAL_COMMUNITY): Payer: Self-pay

## 2023-11-16 ENCOUNTER — Ambulatory Visit: Admitting: Medical

## 2023-11-16 VITALS — BP 110/80 | HR 72 | Temp 98.6°F | Wt 232.6 lb

## 2023-11-16 DIAGNOSIS — R7989 Other specified abnormal findings of blood chemistry: Secondary | ICD-10-CM | POA: Diagnosis not present

## 2023-11-16 DIAGNOSIS — R0683 Snoring: Secondary | ICD-10-CM

## 2023-11-16 DIAGNOSIS — G479 Sleep disorder, unspecified: Secondary | ICD-10-CM

## 2023-11-16 DIAGNOSIS — R5383 Other fatigue: Secondary | ICD-10-CM | POA: Diagnosis not present

## 2023-11-16 NOTE — Progress Notes (Signed)
 Faxed over order to [facility]

## 2023-11-16 NOTE — Progress Notes (Signed)
 Subjective:  Jacqueline Winters is a 43 y.o. female who presents for Chief Complaint  Patient presents with   Acute Visit    Tiredness, seeing floaters with light headaches X 2 weeks. Still having some neck pain so not sure if its from that     Here for fatigue.  For the last few weeks been feeling quite fatigued.  She notes some difficulty with sleep, husband says she has been snoring more.  She has seen or floaters in her vision lately.  No chest pain or palpitations.  No edema.  Sometimes may get a little short of breath with stairs.  Her periods are controlled, regular.  She does take vitamin D  supplement and iron  daily.  She has been under a lot of stress. Been out of work since 02/2023.   Had surgery in May for pinched nerve in neck.    No prior sleep study although we have discussed this prior.  She notes maybe mild depressed mood.    Ortho has released her back to work, but there is some logistical issues with her employer and insurance, so hasn't been back to work yet.  Has been walking, getting some exercise.    This past Sunday was vacuuming at home, felt some lightheadedness.    No other aggravating or relieving factors.    No other c/o.  Past Medical History:  Diagnosis Date   Allergy    GERD (gastroesophageal reflux disease)    with pregnancy   Goiter    Headache(784.0)    Heart murmur    Hidradenitis axillaris    Impaired fasting blood sugar    Insomnia    Obesity    PONV (postoperative nausea and vomiting)    Thyroid  nodule    biopsy 07/2013, consult with Dr. Elsie Sharps endocrinology   Current Outpatient Medications on File Prior to Visit  Medication Sig Dispense Refill   acetaminophen  (TYLENOL ) 500 MG tablet Take 500 mg by mouth every 6 (six) hours as needed.     albuterol  (VENTOLIN  HFA) 108 (90 Base) MCG/ACT inhaler Inhale 1-2 puffs into the lungs every 6 (six) hours as needed for wheezing or shortness of breath. 6.7 g 0   Cetirizine  HCl (ZYRTEC  ALLERGY  PO) Take by mouth.     cholecalciferol (VITAMIN D3) 25 MCG (1000 UNIT) tablet Take by mouth daily.     FOLIC ACID PO Take by mouth.     iron  polysaccharides (FERREX 150) 150 MG capsule Take 1 capsule (150 mg total) by mouth 2 (two) times daily. 180 capsule 0   Magnesium 200 MG TABS Take 250 mg by mouth daily.     meloxicam  (MOBIC ) 7.5 MG tablet Take 7.5 mg by mouth daily.     Multiple Vitamin (MULTIVITAMIN) tablet Take 1 tablet by mouth daily.     Multiple Vitamins-Minerals (ZINC PO) Take by mouth daily in the afternoon.     VITAMIN E PO Take 1 capsule by mouth daily.     No current facility-administered medications on file prior to visit.   The following portions of the patient's history were reviewed and updated as appropriate: allergies, current medications, past family history, past medical history, past social history, past surgical history and problem list.  ROS Otherwise as in subjective above    Objective: BP 110/80   Pulse 72   Temp 98.6 F (37 C)   Wt 232 lb 9.6 oz (105.5 kg)   SpO2 100%   BMI 39.93 kg/m   Wt  Readings from Last 3 Encounters:  11/16/23 232 lb 9.6 oz (105.5 kg)  08/10/23 225 lb (102.1 kg)  05/09/23 227 lb (103 kg)   General appearance: alert, no distress, well developed, well nourished HEENT: normocephalic, sclerae anicteric, conjunctiva pink and moist, TMs pearly, nares patent, no discharge or erythema, pharynx normal Oral cavity: MMM, no lesions Neck: left anterior neck surgical scar, otherwise supple, no lymphadenopathy, no thyromegaly, no masses, no bruits Heart: RRR, normal S1, S2, no murmurs Lungs: CTA bilaterally, no wheezes, rhonchi, or rales Pulses: 2+ radial pulses, 2+ pedal pulses, normal cap refill Ext: no edema Psych: pleasant, answers questions approprietly Neuro: CN2-12 intact, nonfocal exam    Assessment: Encounter Diagnoses  Name Primary?   Fatigue, unspecified type Yes   Low serum cortisol level    Sleep disturbance     Snoring      Plan: We discussed her symptoms and concerns.  I reviewed back of her labs she had back in May 2025, a fairly comprehensive panel that was mostly norma.  she was not anemic at that time, electrolytes normal.  We have discussed doing a sleep study in the past.  Given her fatigue and other visits in the past year related to similar symptoms we will go ahead and pursue sleep study.  Of note she has a sister that has sleep apnea.  She has had a abnormal cortisol level in the past.  We will update that lab as well today.  She has a thyroid  goiter.  She did have surgery and a anterior neck approach for neck surgery.  We will check her thyroid  labs today.  Her thyroid  ultrasound from July 2024 was reviewed, stable as of last year  Jacqueline Winters was seen today for acute visit.  Diagnoses and all orders for this visit:  Fatigue, unspecified type -     CBC -     Cortisol -     Sedimentation rate -     TSH  Low serum cortisol level -     Cortisol  Sleep disturbance  Snoring    Follow up: pending labs, home sleep study

## 2023-11-17 ENCOUNTER — Ambulatory Visit: Payer: Self-pay | Admitting: Medical

## 2023-11-17 LAB — CBC
Hematocrit: 39.5 % (ref 34.0–46.6)
Hemoglobin: 12.2 g/dL (ref 11.1–15.9)
MCH: 28.8 pg (ref 26.6–33.0)
MCHC: 30.9 g/dL — ABNORMAL LOW (ref 31.5–35.7)
MCV: 93 fL (ref 79–97)
Platelets: 337 x10E3/uL (ref 150–450)
RBC: 4.23 x10E6/uL (ref 3.77–5.28)
RDW: 13.3 % (ref 11.7–15.4)
WBC: 7.1 x10E3/uL (ref 3.4–10.8)

## 2023-11-17 LAB — SEDIMENTATION RATE: Sed Rate: 33 mm/h — AB (ref 0–32)

## 2023-11-17 LAB — TSH: TSH: 0.998 u[IU]/mL (ref 0.450–4.500)

## 2023-11-17 LAB — CORTISOL: Cortisol: 9.5 ug/dL (ref 6.2–19.4)

## 2023-11-17 NOTE — Progress Notes (Signed)
 Continue plan for referral for [facility] for sleep study

## 2023-11-20 ENCOUNTER — Other Ambulatory Visit (HOSPITAL_COMMUNITY): Payer: Self-pay

## 2023-11-20 MED ORDER — MELOXICAM 15 MG PO TABS
15.0000 mg | ORAL_TABLET | Freq: Every day | ORAL | 0 refills | Status: DC
  Filled 2023-11-20 – 2023-12-21 (×2): qty 30, 30d supply, fill #0

## 2023-11-20 MED ORDER — METHOCARBAMOL 500 MG PO TABS
500.0000 mg | ORAL_TABLET | Freq: Four times a day (QID) | ORAL | 2 refills | Status: DC | PRN
  Filled 2023-11-20: qty 60, 8d supply, fill #0

## 2023-11-24 LAB — HM MAMMOGRAPHY

## 2023-11-29 ENCOUNTER — Ambulatory Visit: Payer: Self-pay | Admitting: Medical

## 2023-11-29 ENCOUNTER — Other Ambulatory Visit (HOSPITAL_COMMUNITY): Payer: Self-pay

## 2023-11-29 ENCOUNTER — Encounter: Payer: Self-pay | Admitting: Medical

## 2023-12-07 NOTE — Progress Notes (Signed)
 Results through MyChart

## 2023-12-21 ENCOUNTER — Other Ambulatory Visit (HOSPITAL_COMMUNITY): Payer: Self-pay

## 2023-12-22 ENCOUNTER — Other Ambulatory Visit: Payer: Self-pay | Admitting: Medical

## 2023-12-22 ENCOUNTER — Other Ambulatory Visit (HOSPITAL_COMMUNITY): Payer: Self-pay

## 2023-12-22 ENCOUNTER — Encounter (HOSPITAL_COMMUNITY): Payer: Self-pay

## 2024-01-16 ENCOUNTER — Encounter: Payer: Self-pay | Admitting: Family Medicine

## 2024-01-16 ENCOUNTER — Ambulatory Visit: Admitting: Family Medicine

## 2024-01-16 ENCOUNTER — Other Ambulatory Visit (HOSPITAL_COMMUNITY): Payer: Self-pay

## 2024-01-16 VITALS — BP 122/82 | HR 59 | Wt 234.2 lb

## 2024-01-16 DIAGNOSIS — M25559 Pain in unspecified hip: Secondary | ICD-10-CM | POA: Diagnosis not present

## 2024-01-16 DIAGNOSIS — R7301 Impaired fasting glucose: Secondary | ICD-10-CM | POA: Diagnosis not present

## 2024-01-16 LAB — POCT GLYCOSYLATED HEMOGLOBIN (HGB A1C): Hemoglobin A1C: 5.6 % (ref 4.0–5.6)

## 2024-01-16 MED ORDER — MELOXICAM 15 MG PO TABS
15.0000 mg | ORAL_TABLET | Freq: Every day | ORAL | 0 refills | Status: DC
  Filled 2024-01-16 – 2024-02-08 (×2): qty 30, 30d supply, fill #0

## 2024-01-16 NOTE — Progress Notes (Signed)
 Name: Jacqueline Winters   Date of Visit: 01/16/24   Date of last visit with me: Visit date not found   CHIEF COMPLAINT:  Chief Complaint  Patient presents with   Acute Visit    Check A1c and hip pain.        HPI:  Discussed the use of AI scribe software for clinical note transcription with the patient, who gave verbal consent to proceed.  History of Present Illness   Jacqueline Winters is a 43 year old female with a history of cervical spine surgery and hip bursitis who presents with bilateral hip pain.  She experiences aching pain in both hips, more pronounced in the morning, with sharp and shooting pain especially in the left hip. The pain worsened after playing frisbee last Thursday. She has been taking Mobic  (meloxicam ) since then, which she had previously used for cervical spine and shoulder arthritis. The pain is more on the lateral aspect of the hip, with discomfort when lying on the affected side and tenderness upon touch.  Her past medical history includes cervical spine surgery at C[facility] and [facility], where the disc was replaced and fused with titanium. She has received several cortisone injections for arthritis and spurring in the cervical spine and shoulder. She also had bursitis in the hip in 2007.  Her family history includes her mother having diabetes and her father having Paget's disease. She is concerned about her A1c levels due to her family history and her own sweet tooth. Her last A1c was 5.7 in May.  Socially, she has been working in nursing since high school and is currently active, taking care of her six-month-old grandchild and a twelve-year-old child. She mentions walking two miles while pushing a stroller, which caused discomfort in her knee and hip.  Reports tenderness on the outside of the hip and discomfort when lying on the affected side.         OBJECTIVE:       11/16/2023    8:43 AM  Depression screen PHQ 2/9  Decreased Interest 0  Down, Depressed, Hopeless 0   PHQ - 2 Score 0     BP Readings from Last 3 Encounters:  01/16/24 122/82  11/16/23 110/80  08/10/23 110/68    BP 122/82   Pulse (!) 59   Wt 234 lb 3.2 oz (106.2 kg)   SpO2 99%   BMI 40.20 kg/m    Physical Exam   MUSCULOSKELETAL: Tenderness on the lateral aspect of both hips. Weakness in hip abduction bilaterally.      Physical Exam  Inspection reveals no gross abnormality of the bilateral hip.  There is tenderness palpation over the bilateral greater trochanteric area with the left being greater than the right.  FADIR and FABER is positive bilaterally.  Significantly noted hip abduction weakness on the left side compared to the right. ASSESSMENT/PLAN:   Assessment & Plan Greater trochanteric pain syndrome  Impaired fasting glucose    Assessment and Plan    Greater trochanteric pain syndrome, bilateral hips, acute on chronic  Bilateral hip pain, more pronounced on the left, with tenderness. Diagnosis due to gluteus medius and minimus weakness. Arthritis unlikely. - Prescribe home exercises for gluteus medius and minimus for four weeks. - Continue Mobic  (meloxicam ) for 10 days. - Plan steroid injection in four weeks if symptoms persist. - Instruct to perform exercises bilaterally. - Schedule follow-up in four weeks.  Prediabetes A1c is 5.6, indicating prediabetes. No significant changes. Family history of diabetes. -  Advise appointment with Dr. Ludie to discuss Ozempic for appetite control and weight management.         Draco Malczewski A. Vita MD [facility]

## 2024-01-17 ENCOUNTER — Other Ambulatory Visit (HOSPITAL_COMMUNITY): Payer: Self-pay

## 2024-01-25 ENCOUNTER — Other Ambulatory Visit (HOSPITAL_COMMUNITY): Payer: Self-pay

## 2024-02-08 ENCOUNTER — Other Ambulatory Visit (HOSPITAL_COMMUNITY): Payer: Self-pay

## 2024-02-13 ENCOUNTER — Encounter: Payer: Self-pay | Admitting: Family Medicine

## 2024-02-13 ENCOUNTER — Ambulatory Visit: Payer: Self-pay | Admitting: Family Medicine

## 2024-02-13 ENCOUNTER — Other Ambulatory Visit (INDEPENDENT_AMBULATORY_CARE_PROVIDER_SITE_OTHER): Payer: Self-pay

## 2024-02-13 VITALS — BP 124/70 | HR 88 | Wt 234.4 lb

## 2024-02-13 DIAGNOSIS — M25559 Pain in unspecified hip: Secondary | ICD-10-CM

## 2024-02-13 DIAGNOSIS — M25552 Pain in left hip: Secondary | ICD-10-CM

## 2024-02-13 NOTE — Progress Notes (Signed)
   Name: Jacqueline Winters   Date of Visit: 02/13/24   Date of last visit with me: 01/16/2024   CHIEF COMPLAINT:  Chief Complaint  Patient presents with   Follow-up    4 week follow up. Hip is still bothering her, pain scale of 4. Some days better than others. Not much change.        HPI:  Discussed the use of AI scribe software for clinical note transcription with the patient, who gave verbal consent to proceed.  History of Present Illness Jacqueline Winters is a 43 year old female who presents with left leg pain and pelvic discomfort.  She experiences left leg pain that radiates to the knee, primarily on the left side. The pain is rated at 3 to 4 out of 10, down from 5 to 6, and is influenced by movement, particularly when transitioning from sitting to standing. It is less noticeable when walking unless she hyperextends.  Pelvic discomfort flares up, especially when she is coming down from her menstrual cycle. The discomfort is more pronounced when getting out of bed, but less so when walking or stretching.  She has been performing exercises targeting the gluteus medius muscle, which has helped reduce the pain.     OBJECTIVE:       11/16/2023    8:43 AM  Depression screen PHQ 2/9  Decreased Interest 0  Down, Depressed, Hopeless 0  PHQ - 2 Score 0     BP Readings from Last 3 Encounters:  02/13/24 124/70  01/16/24 122/82  11/16/23 110/80    BP 124/70   Pulse 88   Wt 234 lb 6.4 oz (106.3 kg)   BMI 40.23 kg/m    Physical Exam TTP over Greater trochanter    Physical Exam Constitutional:      Appearance: Normal appearance.  Neurological:     General: No focal deficit present.     Mental Status: She is alert and oriented to person, place, and time. Mental status is at baseline.     ASSESSMENT/PLAN:   Assessment & Plan Greater trochanteric pain syndrome    Assessment and Plan Assessment & Plan Left greater trochanteric pain syndrome Chronic pain with improved  strength and reduced severity. Pain radiates to knee, worsens with certain movements. Improvement with gluteus medius exercises. Right side less affected. - Administered ultrasound-guided injection with numbing medicine and steroid to the left greater trochanteric region. - Continue exercises targeting the gluteus medius muscle. - Progress to using ankle weights to increase resistance and strengthen muscles. - Monitor response to injection; if no improvement, consider MRI of the hip.   US -Guided Greater Trochanteric Bursa Injection, left After discussion on risks/benefits/indications and informed verbal consent was obtained, a timeout was performed. The patient was lying in lateral recumbent position on exam table. Using ultrasound guidance, the greater trochanter was identified. The area overlying the trochanteric bursa was then prepped with Betadine and alcohol swabs. Following sterile precautions, ultrasound was reapplied to visualize needle guidance with a 22-gauge 3.5 needle utilizing an in-plane approach to inject the bursa with 2:2:1 lidocaine :bupivicaine:betamethasone. Delivery of the injectate was visualized into the region of hypoechoic fluid of the greater trochanteric bursa. Patient tolerated procedure well without immediate complications.     Kerin Kren A. Vita MD [facility]

## 2024-02-15 ENCOUNTER — Ambulatory Visit: Admitting: Medical

## 2024-02-15 VITALS — BP 112/70 | HR 64 | Wt 237.2 lb

## 2024-02-15 DIAGNOSIS — Z6841 Body Mass Index (BMI) 40.0 and over, adult: Secondary | ICD-10-CM

## 2024-02-15 DIAGNOSIS — R7301 Impaired fasting glucose: Secondary | ICD-10-CM | POA: Diagnosis not present

## 2024-02-15 DIAGNOSIS — E559 Vitamin D deficiency, unspecified: Secondary | ICD-10-CM | POA: Diagnosis not present

## 2024-02-15 NOTE — Progress Notes (Signed)
 Subjective:  Jacqueline Winters is a 43 y.o. female who presents for Chief Complaint  Patient presents with   Consult    Discuss weight loss meds     Jacqueline Winters is a 43 year old female who presents for evaluation of weight loss options.  She is seeking to qualify for semaglutide or Ozempic to aid in weight loss due to difficulty losing weight and significant joint discomfort. She reports joint pain, a history of cervical spine issues, and arthritis in her shoulder.  Approximately five weeks ago, she experienced hip pain and was told by a previous doctor that she had weakness in the gluteus maximus and trochanteric area. She was prescribed exercises for four weeks and received a cortisone injection in her hip. The pain radiates to her knee.  She has a history of using Topamax  for weight loss, which was effective in the past, helping her reach a weight of 180 pounds. However, following an injury, her weight has been difficult to manage, currently at 237 pounds, up from 233 pounds a year ago.  Her diabetes screening and A1c were normal approximately four to five weeks ago. She experiences joint pain and fatigue, which she attributes to poor sleep quality, stating 'I'm aching all night' and waking up fatigued, leading to cravings for comfort food.  She mentions a lack of a support group for weight loss, which she previously had with an [employer] group. Her husband follows a different routine that does not suit her needs, especially since she does not have a gallbladder.  She works at [employer] and returned to work in [employer] after a nine-month hiatus, which she believes contributed to muscle weakness and hip inflammation.   No other aggravating or relieving factors.    No other c/o.  Past Medical History:  Diagnosis Date   Allergy    GERD (gastroesophageal reflux disease)    with pregnancy   Goiter    Headache(784.0)    Heart murmur    Hidradenitis axillaris    Impaired fasting  blood sugar    Insomnia    Obesity    PONV (postoperative nausea and vomiting)    Thyroid  nodule    biopsy 07/2013, consult with Dr. Elsie Sharps endocrinology   Current Outpatient Medications on File Prior to Visit  Medication Sig Dispense Refill   acetaminophen  (TYLENOL ) 500 MG tablet Take 500 mg by mouth every 6 (six) hours as needed.     albuterol  (VENTOLIN  HFA) 108 (90 Base) MCG/ACT inhaler Inhale 1-2 puffs into the lungs every 6 (six) hours as needed for wheezing or shortness of breath. 6.7 g 0   Cetirizine  HCl (ZYRTEC  ALLERGY PO) Take by mouth.     cholecalciferol (VITAMIN D3) 25 MCG (1000 UNIT) tablet Take by mouth daily.     FOLIC ACID PO Take by mouth.     iron  polysaccharides (FERREX 150) 150 MG capsule Take 1 capsule (150 mg total) by mouth 2 (two) times daily. 180 capsule 0   Magnesium 200 MG TABS Take 250 mg by mouth daily.     Multiple Vitamin (MULTIVITAMIN) tablet Take 1 tablet by mouth daily.     Multiple Vitamins-Minerals (ZINC PO) Take by mouth daily in the afternoon.     VITAMIN E PO Take 1 capsule by mouth daily.     meloxicam  (MOBIC ) 15 MG tablet Take 1 tablet (15 mg total) by mouth daily. (Patient not taking: Reported on 02/15/2024) 30 tablet 0   No current facility-administered medications  on file prior to visit.   The following portions of the patient's history were reviewed and updated as appropriate: allergies, current medications, past family history, past medical history, past social history, past surgical history and problem list.  ROS Otherwise as in subjective above    Objective: BP 112/70   Pulse 64   Wt 237 lb 3.2 oz (107.6 kg)   BMI 40.72 kg/m   Wt Readings from Last 3 Encounters:  02/15/24 237 lb 3.2 oz (107.6 kg)  02/13/24 234 lb 6.4 oz (106.3 kg)  01/16/24 234 lb 3.2 oz (106.2 kg)   BP Readings from Last 3 Encounters:  02/15/24 112/70  02/13/24 124/70  01/16/24 122/82   General appearance: alert, no distress, well developed, well  nourished Psych: pleasant, answers questions appropriately    Assessment: Encounter Diagnoses  Name Primary?   Impaired fasting blood sugar Yes   BMI 40.0-44.9, adult (HCC)    Morbid obesity (HCC)    Vitamin D  deficiency      Plan: Morbid obesity with BMI 40.0-44.9 associated with vit D deficiency and impaired glucose Weight increased from 233 lbs to 237 lbs. Previous weight loss with Topamax  and lifestyle changes. Current challenges: joint pain, difficulty maintaining weight loss. Discussed dietary changes, exercise options, and weight loss study participation. Emphasized dietary discipline and insurance challenges for medications. - Referred to weight loss study with [employer]. - Advised on dietary changes: track calories, increase protein intake, reduce carbohydrates. - Recommended low-impact exercises: hand bikes, pool exercises, rowing machines. - Encouraged good sleep hygiene and stress management.   Tiffnay was seen today for consult.  Diagnoses and all orders for this visit:  Impaired fasting blood sugar  BMI 40.0-44.9, adult (HCC)  Morbid obesity (HCC)  Vitamin D  deficiency    Follow up: with research study

## 2024-02-15 NOTE — Progress Notes (Signed)
 Sent referral

## 2024-03-12 MED ORDER — BETAMETHASONE SOD PHOS & ACET 6 (3-3) MG/ML IJ SUSP
12.0000 mg | Freq: Once | INTRAMUSCULAR | Status: AC
  Administered 2024-02-13: 12 mg via INTRAMUSCULAR

## 2024-03-12 MED ORDER — BUPIVACAINE HCL 0.25 % IJ SOLN
2.0000 mL | Freq: Once | INTRAMUSCULAR | Status: AC
  Administered 2024-02-13: 2 mL

## 2024-03-12 MED ORDER — LIDOCAINE HCL 1 % IJ SOLN
5.0000 mL | Freq: Once | INTRAMUSCULAR | Status: AC
  Administered 2024-02-13: 5 mL via INTRADERMAL

## 2024-03-12 NOTE — Addendum Note (Signed)
 Addended by: LATTIE CARLO BROCKS on: 03/12/2024 08:36 AM   Modules accepted: Orders

## 2024-05-02 ENCOUNTER — Ambulatory Visit (INDEPENDENT_AMBULATORY_CARE_PROVIDER_SITE_OTHER): Admitting: Nurse Practitioner

## 2024-05-02 ENCOUNTER — Other Ambulatory Visit (HOSPITAL_COMMUNITY): Payer: Self-pay

## 2024-05-02 ENCOUNTER — Encounter: Payer: Self-pay | Admitting: Nurse Practitioner

## 2024-05-02 VITALS — BP 122/74 | HR 68 | Wt 239.4 lb

## 2024-05-02 DIAGNOSIS — K219 Gastro-esophageal reflux disease without esophagitis: Secondary | ICD-10-CM | POA: Diagnosis not present

## 2024-05-02 DIAGNOSIS — I499 Cardiac arrhythmia, unspecified: Secondary | ICD-10-CM | POA: Insufficient documentation

## 2024-05-02 DIAGNOSIS — M25559 Pain in unspecified hip: Secondary | ICD-10-CM

## 2024-05-02 DIAGNOSIS — M2550 Pain in unspecified joint: Secondary | ICD-10-CM

## 2024-05-02 DIAGNOSIS — G8929 Other chronic pain: Secondary | ICD-10-CM

## 2024-05-02 MED ORDER — MELOXICAM 15 MG PO TABS
15.0000 mg | ORAL_TABLET | Freq: Every day | ORAL | 3 refills | Status: AC
  Filled 2024-05-02: qty 30, 30d supply, fill #0

## 2024-05-02 MED ORDER — PANTOPRAZOLE SODIUM 40 MG PO TBEC
40.0000 mg | DELAYED_RELEASE_TABLET | Freq: Every day | ORAL | 3 refills | Status: AC
  Filled 2024-05-02: qty 30, 30d supply, fill #0

## 2024-05-02 NOTE — Patient Instructions (Addendum)
 Try backing back down on the weight a little bit and focus on increasing a little more slowly and focusing on reps over weight.   I will  let you know what your labs show and if we need to make any changes we can talk about that.   If the pain gets worse or changes, we can always get an MRI of the hip, but right now you are on the right track.

## 2024-05-02 NOTE — Progress Notes (Signed)
 "  [name], DNP, AGNP-c [facility]  [address] [place], [place] [address] [phone]   ESTABLISHED PATIENT- Chronic Health and/or Follow-Up Visit on 05/02/2024  Blood pressure 122/74, pulse 68, weight 239 lb 6.4 oz (108.6 kg).   Subjective:  other (3 month f/u on hip pain, felt better, then added weight on to her exercise, felt some tightness and started mobic  back, )   History of Present Illness Jacqueline Winters is a 44 year old female who presents for a three-month follow-up for greater trochanteric pain syndrome of the left hip.  She initially experienced improvement in her symptoms after receiving an injection into the hip, which allowed her to increase her physical activity. However, this led to a recurrence of pain, particularly after she started adding increased weight during abduction/adduction exercises at the gym. The pain is rated around a three on a scale of ten, with certain movements like hyperextension or flexing the hip exacerbating the discomfort. No significant pain during walking.  She has a history of cervical spine surgery and reports some rotator cuff issues, with arthritis and spurring noted by her orthopedic specialist. She mentions a history of arthritis of the knees diagnosed at age 63, with current aches and pains in various joints, including her knees, hips, and shoulders. She has gained approximately 40 pounds since 2020 and is actively trying to lose weight through exercise.  She is currently taking Mobic , which she resumed recently after a break, and reports that it helps dull the pain. She also uses Aleve  (when not taking Mobic ) and magnesium, which she finds beneficial for relaxation and sleep. She takes magnesium glycinate, 500 mg, nightly and has been using it since 2022.  She has a family history of joint issues, with her mother and grandmother experiencing similar problems. Her grandmother lived to 70 and had colon cancer, while  her mother is approaching 79 and remains active despite joint pain.  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 She has two children in college and two grandchildren, and she remains active in her daily life despite her symptoms. She questions possible autoimmune component to her joint pain given the length of time she has been having issues and multiple joints affected.   She reports a history of frequent illnesses, including COVID-19, flu, and other viral infections, and has been taking vitamin D  to support her immune system. ROS negative except for what is listed in HPI. History, Medications, Surgery, SDOH, and Family History reviewed and updated as appropriate.  Objective:  Physical Exam Vitals and nursing note reviewed.  Constitutional:      General: She is not in acute distress.    Appearance: Normal appearance.  HENT:     Head: Normocephalic.  Musculoskeletal:        General: Tenderness present. No swelling.     Comments: Tenderness in the left hip with abduction. Strength intact. No edema.   Skin:    General: Skin is warm and dry.     Capillary Refill: Capillary refill takes less than 2 seconds.     Findings: No erythema or rash.  Neurological:     Mental Status: She is alert and oriented to person, place, and time.     Sensory: No sensory deficit.     Motor: No weakness.     Gait: Gait normal.  Psychiatric:        Mood and  Affect: Mood normal.         Assessment & Plan:   Assessment & Plan Greater trochanteric pain syndrome Chronic greater trochanteric pain syndrome with intermittent exacerbations. Initial improvement post-injection, but pain recurred at a much lesser rate with increased exercise intensity (weight of 50lbs). Pain is mild (3-4/10) during certain movements, not debilitating. No significant impact on daily activities. We discussed returning back to exercises with less  weight for a longer period to help strengthen the muscle without stressing the area and adding more weight to a small number of reps in a stepwise approach to continue to strengthen the muscle.  - Advised reducing weight during exercises to 40 pounds and increasing repetitions to build strength without exacerbating pain. Consider 3-5 reps at higher weight then finishing out  - Continue meloxicam  for pain management. - Ordered labs to check for rheumatoid factor and anti-CCP to rule out autoimmune etiology. - Prescribed pantoprazole  to manage potential stomach upset from meloxicam . - Will consider MRI if pain worsens or does not improve with current management. Orders:   meloxicam  (MOBIC ) 15 MG tablet; Take 1 tablet (15 mg total) by mouth daily.  Chronic pain of multiple joints Chronic joint pain affecting multiple areas possibly related to osteoarthritis.  Patient discussed concerns with autoimmune conditions that could be contributing.  Given the pain has been persistent over the years with recent exacerbation we will plan for lab testing to rule out autoimmune etiology.  Current pain management includes meloxicam  and magnesium supplementation.  Will make changes to the plan of care as necessary based on lab results. Orders:   Sedimentation rate   ANA, IFA (with reflex)   Anti-CCP Ab, IgG + IgA (RDL)   Rheumatoid factor   Magnesium  Gastroesophageal reflux disease without esophagitis Patient experiencing stomach upset related to increased use of NSAIDs.  Discussed option to utilize PPI for stomach protection while on these medications.  Will send prescription for pantoprazole  for use. Orders:   pantoprazole  (PROTONIX ) 40 MG tablet; Take 1 tablet (40 mg total) by mouth daily.    [name] E Christain Niznik, DNP, AGNP-c    "

## 2024-05-07 LAB — RHEUMATOID FACTOR: Rheumatoid fact SerPl-aCnc: <10 [IU]/mL

## 2024-05-07 LAB — SEDIMENTATION RATE: Sed Rate: 17 mm/h (ref 0–32)

## 2024-05-07 LAB — ANTINUCLEAR ANTIBODIES, IFA: ANA Titer 1: NEGATIVE

## 2024-05-07 LAB — ANTI-CCP AB, IGG + IGA (RDL): Anti-CCP Ab, IgG + IgA (RDL): <20 U

## 2024-05-07 LAB — MAGNESIUM: Magnesium: 2.2 mg/dL (ref 1.6–2.3)

## 2024-05-08 ENCOUNTER — Ambulatory Visit: Payer: Self-pay | Admitting: Nurse Practitioner

## 2024-05-10 ENCOUNTER — Ambulatory Visit: Admitting: Family Medicine

## 2024-05-14 ENCOUNTER — Other Ambulatory Visit (HOSPITAL_COMMUNITY): Payer: Self-pay

## 2024-07-30 ENCOUNTER — Ambulatory Visit: Admitting: Family Medicine
